# Patient Record
Sex: Male | Born: 1940
Health system: Southern US, Community
[De-identification: ages and names within clinical notes are randomized; demographics above are authoritative.]

## PROBLEM LIST (undated history)

## (undated) DIAGNOSIS — G4730 Sleep apnea, unspecified: Secondary | ICD-10-CM

## (undated) DIAGNOSIS — F952 Tourette's disorder: Secondary | ICD-10-CM

## (undated) DIAGNOSIS — I10 Essential (primary) hypertension: Secondary | ICD-10-CM

## (undated) DIAGNOSIS — Z87442 Personal history of urinary calculi: Secondary | ICD-10-CM

## (undated) DIAGNOSIS — E119 Type 2 diabetes mellitus without complications: Secondary | ICD-10-CM

## (undated) DIAGNOSIS — G709 Myoneural disorder, unspecified: Secondary | ICD-10-CM

## (undated) DIAGNOSIS — G4733 Obstructive sleep apnea (adult) (pediatric): Secondary | ICD-10-CM

## (undated) DIAGNOSIS — R7303 Prediabetes: Secondary | ICD-10-CM

## (undated) DIAGNOSIS — I2510 Atherosclerotic heart disease of native coronary artery without angina pectoris: Secondary | ICD-10-CM

## (undated) DIAGNOSIS — E7800 Pure hypercholesterolemia, unspecified: Secondary | ICD-10-CM

## (undated) HISTORY — DX: Atherosclerotic heart disease of native coronary artery without angina pectoris: I25.10

## (undated) HISTORY — DX: Type 2 diabetes mellitus without complications: E11.9

## (undated) HISTORY — DX: Tourette's disorder: F95.2

## (undated) HISTORY — DX: Pure hypercholesterolemia, unspecified: E78.00

## (undated) HISTORY — DX: Sleep apnea, unspecified: G47.30

## (undated) HISTORY — DX: Obstructive sleep apnea (adult) (pediatric): G47.33

---

## 1981-10-29 HISTORY — PX: BACK SURGERY: SHX140

## 1995-10-30 DIAGNOSIS — I209 Angina pectoris, unspecified: Secondary | ICD-10-CM

## 1995-10-30 HISTORY — PX: CARDIAC CATHETERIZATION: SHX172

## 1995-10-30 HISTORY — DX: Angina pectoris, unspecified: I20.9

## 1996-01-16 HISTORY — PX: OTHER SURGICAL HISTORY: SHX169

## 2002-05-15 ENCOUNTER — Ambulatory Visit (HOSPITAL_COMMUNITY): Admission: RE | Admit: 2002-05-15 | Discharge: 2002-05-15 | Payer: Self-pay | Admitting: Cardiology

## 2002-05-15 ENCOUNTER — Encounter: Payer: Self-pay | Admitting: Cardiology

## 2003-09-13 ENCOUNTER — Ambulatory Visit: Admission: RE | Admit: 2003-09-13 | Discharge: 2003-09-13 | Payer: Self-pay | Admitting: Pulmonary Disease

## 2005-01-29 ENCOUNTER — Ambulatory Visit: Payer: Self-pay | Admitting: *Deleted

## 2006-04-29 ENCOUNTER — Ambulatory Visit: Payer: Self-pay | Admitting: *Deleted

## 2007-05-26 ENCOUNTER — Ambulatory Visit (HOSPITAL_COMMUNITY): Admission: RE | Admit: 2007-05-26 | Discharge: 2007-05-26 | Payer: Self-pay | Admitting: Urology

## 2011-03-16 NOTE — Procedures (Signed)
[facility]  Patient:    [name], [name] Visit Number: [id number] MRN: [id number]          Service Type: OUT Location: RAD Attending Physician:  Mirian Mo Dictated by:   Joellyn Rued, P.A.-C. Proc. Date: 05/15/02 Admit Date:  05/15/2002 Discharge Date: 05/15/2002                                Stress Test  DATE OF BIRTH:  [birth year]  SUMMARY OF HISTORY:  [name] is a 70 year old white male who returned to our office on May 04, 2002 after being absent for two and a half years.  He is not having any active cardiac issues; however, Dr. Daleen Squibb felt that it was well overdue for a stress Cardiolite to evaluate his history of coronary artery disease with angioplasty to the RCA in August 1996 and stent to the RCA in 1997, hyperlipidemia, remote tobacco use, and obesity.  Baseline EKG showed normal sinus rhythm, blood pressure 114/70, resting heart rate 72.  Utilizing a Bruce protocol, [name] ambulated for a total of nine minutes and 18 seconds achieving 10.1 METS.  Maximum blood pressure was 200/98.  Maximum heart rate was 155.  This was 99% of predicted maximum range. Toward the end of the test he complained of fatigue, felt that he was out of shape, and some shortness of breath.  EKG did not show any acute changes.  Dr. Daleen Squibb to review.  Final results and images pending Dr. Anola Gurney review.Dictated by: Joellyn Rued, P.A.-C. Attending Physician:  Mirian Mo DD:  05/15/02 TD:  05/20/02 Job: [id number]

## 2011-12-12 DIAGNOSIS — H25019 Cortical age-related cataract, unspecified eye: Secondary | ICD-10-CM | POA: Diagnosis not present

## 2012-07-25 DIAGNOSIS — Z23 Encounter for immunization: Secondary | ICD-10-CM | POA: Diagnosis not present

## 2012-09-03 DIAGNOSIS — I10 Essential (primary) hypertension: Secondary | ICD-10-CM | POA: Diagnosis not present

## 2012-09-03 DIAGNOSIS — E119 Type 2 diabetes mellitus without complications: Secondary | ICD-10-CM | POA: Diagnosis not present

## 2012-09-03 DIAGNOSIS — E785 Hyperlipidemia, unspecified: Secondary | ICD-10-CM | POA: Diagnosis not present

## 2012-09-03 DIAGNOSIS — E782 Mixed hyperlipidemia: Secondary | ICD-10-CM | POA: Diagnosis not present

## 2012-09-04 DIAGNOSIS — E119 Type 2 diabetes mellitus without complications: Secondary | ICD-10-CM | POA: Diagnosis not present

## 2012-09-04 DIAGNOSIS — R109 Unspecified abdominal pain: Secondary | ICD-10-CM | POA: Diagnosis not present

## 2012-09-04 DIAGNOSIS — I10 Essential (primary) hypertension: Secondary | ICD-10-CM | POA: Diagnosis not present

## 2012-11-29 HISTORY — PX: CATARACT EXTRACTION: SUR2

## 2012-12-12 DIAGNOSIS — H52209 Unspecified astigmatism, unspecified eye: Secondary | ICD-10-CM | POA: Diagnosis not present

## 2012-12-12 DIAGNOSIS — H2510 Age-related nuclear cataract, unspecified eye: Secondary | ICD-10-CM | POA: Diagnosis not present

## 2012-12-12 DIAGNOSIS — E119 Type 2 diabetes mellitus without complications: Secondary | ICD-10-CM | POA: Diagnosis not present

## 2012-12-12 DIAGNOSIS — H35379 Puckering of macula, unspecified eye: Secondary | ICD-10-CM | POA: Diagnosis not present

## 2013-01-06 DIAGNOSIS — H25019 Cortical age-related cataract, unspecified eye: Secondary | ICD-10-CM | POA: Diagnosis not present

## 2013-01-06 DIAGNOSIS — H2510 Age-related nuclear cataract, unspecified eye: Secondary | ICD-10-CM | POA: Diagnosis not present

## 2013-01-06 DIAGNOSIS — H2589 Other age-related cataract: Secondary | ICD-10-CM | POA: Diagnosis not present

## 2013-04-01 ENCOUNTER — Encounter: Payer: Self-pay | Admitting: Neurology

## 2013-04-01 ENCOUNTER — Ambulatory Visit (INDEPENDENT_AMBULATORY_CARE_PROVIDER_SITE_OTHER): Payer: Medicare Other | Admitting: Neurology

## 2013-04-01 VITALS — BP 133/70 | HR 70 | Temp 97.4°F | Ht 68.0 in | Wt 252.0 lb

## 2013-04-01 DIAGNOSIS — M2669 Other specified disorders of temporomandibular joint: Secondary | ICD-10-CM | POA: Diagnosis not present

## 2013-04-01 DIAGNOSIS — G4733 Obstructive sleep apnea (adult) (pediatric): Secondary | ICD-10-CM | POA: Diagnosis not present

## 2013-04-01 DIAGNOSIS — W19XXXS Unspecified fall, sequela: Secondary | ICD-10-CM

## 2013-04-01 DIAGNOSIS — Y92009 Unspecified place in unspecified non-institutional (private) residence as the place of occurrence of the external cause: Secondary | ICD-10-CM

## 2013-04-01 DIAGNOSIS — F952 Tourette's disorder: Secondary | ICD-10-CM | POA: Diagnosis not present

## 2013-04-01 HISTORY — DX: Tourette's disorder: F95.2

## 2013-04-01 MED ORDER — GUANFACINE HCL 1 MG PO TABS: ORAL_TABLET | ORAL | Status: DC

## 2013-04-01 NOTE — Progress Notes (Signed)
Subjective:    Patient ID: [name] is a 72 y.o. male.  HPI  Huston Foley, MD, PhD [facility] [address], [address] [address] [place], [place] [address]  Dear Dr. Margo Aye,  I saw your patient, [name], upon your kind request in my neurologic clinic today for initial consultation of his history of Tourette's syndrome. The patient is accompanied by his wife today. As you know, [name] is a very pleasant 72 year old right-handed gentleman with an underlying medical history of coronary artery disease, status post angioplasty in 1996 and stent in 1997, hyperlipidemia, obesity, who started having having phonic tic at age 79. He saw Dr. Lyman Bishop at [facility] for CO2 treatments. He had ongoing motor and phonic tics and also had a period of coprolalia over the years and was diagnosed with Tourette's syndrome at age 34 by Dr. Kathrynn Ducking, Neurologist at [facility]. Prior to that they treated him with Malady de tics. His youngest son has tourettes per patient. The patient currently has jaw clenching and tooth grinding tics, neck jerking tics, and grunting and coughing tics.  He had different motor tics and phonic tics in the past. He was treated in the past with Haldol, but he was not on any other meds for this. He has had some balance problems, and fell recently, with no injury to head or otherwise, no LOC. He is overweight and snores heavily. He was diagnosed with severe OSA in the past, some 10-15 years ago, but has not been treated for it.   His Past Medical History Is Significant For: Past Medical History  Diagnosis Date  . Tourette syndrome   . Diabetes   . Tourette's syndrome 04/01/2013    His Past Surgical History Is Significant For: Past Surgical History  Procedure Laterality Date  . Cataract extraction  11/2012  . Heart stent  01/16/1996    His Family History Is Significant For: No family history on file.  His Social History Is Significant For: History   Social  History  . Marital Status: Married    Spouse Name: N/A    Number of Children: N/A  . Years of Education: N/A   Social History Main Topics  . Smoking status: Former Games developer  . Smokeless tobacco: None  . Alcohol Use: No  . Drug Use: No  . Sexually Active: None   Other Topics Concern  . None   Social History Narrative  . None    His Allergies Are:  No Known Allergies:   His Current Medications Are:  Outpatient Encounter Prescriptions as of 04/01/2013  Medication Sig Dispense Refill  . aspirin (ECOTRIN LOW STRENGTH) 81 MG EC tablet Take 81 mg by mouth daily. Swallow whole.      Marland Kitchen KOMBIGLYZE XR 02-999 MG TB24       . metoprolol succinate (TOPROL-XL) 25 MG 24 hr tablet       . simvastatin (ZOCOR) 40 MG tablet        No facility-administered encounter medications on file as of 04/01/2013.   Review of Systems  Respiratory:       Snoring  Genitourinary:       Impotence  Hematological: Bruises/bleeds easily.    Objective:  Neurologic Exam  Physical Exam Physical Examination:   Filed Vitals:   04/01/13 0834  BP: 133/70  Pulse: 70  Temp: 97.4 F (36.3 C)    General Examination: The patient is a very pleasant 72 y.o. male in no acute distress. He appears well-developed  and well-nourished, obese and well groomed.   HEENT: He has intermittent jaw clenching tics and neck flexion tics and grunting and yelling tics and coughing and sniffing tics. He has rare facial grimacing tics and eye rolling tics. Normocephalic, atraumatic, pupils are equal, round and reactive to light and accommodation. Funduscopic exam is normal with sharp disc margins noted. Extraocular tracking is good without limitation to gaze excursion or nystagmus noted. Normal smooth pursuit is noted. Hearing is grossly intact. Tympanic membranes are clear bilaterally. Face is symmetric with normal facial animation and normal facial sensation. Speech is clear with no dysarthria noted. There is no hypophonia. There is no  lip, neck/head, jaw or voice tremor. Neck is supple with full range of passive and active motion. There are no carotid bruits on auscultation. Oropharynx exam reveals: mild mouth dryness, adequate dental hygiene and severe airway crowding, due to narrow airway, large uvula, thick soft palate. Mallampati is class III. Tongue protrudes centrally and palate elevates symmetrically. Neck size is 20 inches.   Chest: Clear to auscultation without wheezing, rhonchi or crackles noted.  Heart: S1+S2+0, regular and normal without murmurs, rubs or gallops noted.   Abdomen: Soft, non-tender and non-distended with normal bowel sounds appreciated on auscultation.  Extremities: There is 1+ pitting edema in the distal lower extremities bilaterally. Pedal pulses are intact.  Skin: Warm and dry without trophic changes noted. There are no varicose veins, but has spider veins.  Musculoskeletal: exam reveals no obvious joint deformities, tenderness or joint swelling or erythema.   Neurologically:  Mental status: The patient is awake, alert and oriented in all 4 spheres. His memory, attention, language and knowledge are appropriate. There is no aphasia, agnosia, apraxia or anomia. Speech is clear with normal prosody and enunciation with intermittent grunting, sniffing, yelling out, throat clearing, coughing tics. Thought process is linear. Mood is congruent and affect is normal.  Cranial nerves are as described above under HEENT exam. In addition, shoulder shrug is normal with equal shoulder height noted. Motor exam: Normal bulk, strength and tone is noted. There is no drift, tremor or rebound. Romberg is negative. Reflexes are 2+ throughout. Toes are downgoing bilaterally. Fine motor skills are intact with normal finger taps, normal hand movements, normal rapid alternating patting, normal foot taps and normal foot agility.  Cerebellar testing shows no dysmetria or intention tremor on finger to nose testing. Heel to shin  is unremarkable bilaterally. There is no truncal or gait ataxia.  Sensory exam is intact to light touch, pinprick, vibration, temperature sense and proprioception in the upper and lower extremities.  Gait, station and balance are unremarkable. No veering to one side is noted. No leaning to one side is noted. Posture is age-appropriate and stance is narrow based. No problems turning are noted. He turns en bloc.               Assessment and Plan:   Assessment and Plan:  In summary, [name] is a very pleasant 72 y.o.-year old male with a history of Tourette's syndrome, diagnosed many years ago. His physical exam is c/w phonic and motor tics and overwise non-focal with the exception of obesity and a tight airway with a prior Dx of OSA.  I had a long chat with the patient and wife about my findings and the diagnosis of TS, as well as OSA, the prognosis and treatment options. We talked about medical treatments and non-pharmacological approaches. We talked about maintaining a healthy lifestyle in general. I encouraged  the patient to eat healthy, exercise daily and keep well hydrated, to keep a scheduled bedtime and wake time routine, to not skip any meals and eat healthy snacks in between meals and to have protein with every meal. We talked about wt loss.   As far as further diagnostic testing is concerned, I suggested the following today: no test needed today other than sleep study. I talked to him about the cardiovascular implications of untreated OSA. He would be willing to get retested and treated with CPAP if the need arises. As far as his Tourette's syndrome symptomatic treatment he understands that not every medication is successful and sometimes we have to try and switch to different medications. To that end I would like to try him on Tenex starting at half a milligram at night and then building up to 1 mg at night thereafter. He is advised to monitor his blood pressure. He is advised to look out  for symptoms of lightheadedness and bradycardia. Alternatively I may try him on Orap down the Road. He understands that neuroleptic medications can cause other complications in the future with ongoing use. I would like see him back in 3 months from now, sooner if the need arises and encouraged them to call with any interim questions, concerns or problems. I will also initiate treatment for sleep apnea if the sleep is positive for OSA which I believe he has a fairly high pretest probability for OSA.  Thank you very much for allowing me to participate in the care of this nice patient. If I can be of any further assistance to you please do not hesitate to call me at [phone].  Sincerely,   Huston Foley, MD, PhD talked to him about the cardiovascular implications of obstructive sleep apnea

## 2013-04-01 NOTE — Patient Instructions (Addendum)
I think overall you are doing fairly well but I do want to suggest a few things today:  Remember to drink plenty of fluid, eat healthy meals and do not skip any meals. Try to eat protein with a every meal and eat a healthy snack such as fruit or nuts in between meals. Try to keep a regular sleep-wake schedule and try to exercise daily, particularly in the form of walking, 20-30 minutes a day, if you can.   Measure your blood pressure at home every other day for the first 2 weeks.   As far as your medications are concerned, I would like to suggest a trial of Tenex: Take 1/2 pill each night for 2 weeks, then 1 pill each bedtime thereafter.    As far as diagnostic testing: No test today.  I would like to see you back in 3 months, sooner if we need to. Please call us with any interim questions, concerns, problems, updates or refill requests.  Brett Canales is my clinical assistant and will answer any of your questions and relay your messages to me and also relay most of my messages to you.  Our phone number is [phone]. We also have an after hours call service for urgent matters and there is a physician on-call for urgent questions. For any emergencies you know to call 911 or go to the nearest emergency room.

## 2013-04-23 ENCOUNTER — Ambulatory Visit (INDEPENDENT_AMBULATORY_CARE_PROVIDER_SITE_OTHER): Payer: Medicare Other | Admitting: Neurology

## 2013-04-23 VITALS — BP 115/68 | HR 58 | Ht 68.0 in | Wt 250.0 lb

## 2013-04-23 DIAGNOSIS — G479 Sleep disorder, unspecified: Secondary | ICD-10-CM | POA: Diagnosis not present

## 2013-04-23 DIAGNOSIS — G4731 Primary central sleep apnea: Secondary | ICD-10-CM

## 2013-04-23 DIAGNOSIS — G4733 Obstructive sleep apnea (adult) (pediatric): Secondary | ICD-10-CM | POA: Diagnosis not present

## 2013-05-08 ENCOUNTER — Telehealth: Payer: Self-pay | Admitting: Neurology

## 2013-05-08 DIAGNOSIS — G4731 Primary central sleep apnea: Secondary | ICD-10-CM

## 2013-05-08 DIAGNOSIS — G4733 Obstructive sleep apnea (adult) (pediatric): Secondary | ICD-10-CM

## 2013-05-08 NOTE — Telephone Encounter (Signed)
Please call and notify patient that the recent sleep study confirmed the diagnosis of OSA. He did well with biPAP during the study with significant improvement of the respiratory events. Therefore, I would like start the patient on biPAP at home. I placed the order in the chart.   Arrange for PAP set up at home through a DME company of patient's choice and fax/route report to PCP and referring MD (if other than PCP).   The patient will also need a follow up appointment with me in 6-8 weeks post set up that has to be scheduled; help the patient schedule this (in a follow-up slot).   Please re-enforce the importance of compliance with treatment and the need for [place] to monitor compliance data.   Once you have spoken to the patient and scheduled the return appointment, you may close this encounter, thanks,   Huston Foley, MD, PhD [facility]

## 2013-05-10 NOTE — Telephone Encounter (Signed)
Called pt on cell, he asked me to call him back in 15 minutes at his home number, I called this number and he wasn't home yet.  Will try again later on this week.

## 2013-05-14 NOTE — Telephone Encounter (Signed)
Called patient to discuss sleep study results.  Discussed findings, recommendations and follow up care.  Patient understood well and all questions were answered.    Orders will be sent to [facility] 05/14/2013.  Results will be faxed to Dr. Dwana Melena.  Results will be mailed to pt, he already has a follow up scheduled in September with Dr. Frances Furbish.

## 2013-05-20 ENCOUNTER — Encounter: Payer: Self-pay | Admitting: *Deleted

## 2013-06-25 ENCOUNTER — Ambulatory Visit: Payer: Medicare Other | Admitting: Neurology

## 2013-07-02 ENCOUNTER — Ambulatory Visit: Payer: Medicare Other | Admitting: Neurology

## 2013-08-03 ENCOUNTER — Ambulatory Visit: Payer: Medicare Other | Admitting: Neurology

## 2013-08-05 ENCOUNTER — Ambulatory Visit (INDEPENDENT_AMBULATORY_CARE_PROVIDER_SITE_OTHER): Payer: Medicare Other | Admitting: Neurology

## 2013-08-05 ENCOUNTER — Encounter: Payer: Self-pay | Admitting: Neurology

## 2013-08-05 VITALS — BP 110/73 | HR 65 | Temp 97.0°F | Ht 68.0 in | Wt 252.0 lb

## 2013-08-05 DIAGNOSIS — G4731 Primary central sleep apnea: Secondary | ICD-10-CM

## 2013-08-05 DIAGNOSIS — F952 Tourette's disorder: Secondary | ICD-10-CM | POA: Diagnosis not present

## 2013-08-05 DIAGNOSIS — G4730 Sleep apnea, unspecified: Secondary | ICD-10-CM

## 2013-08-05 DIAGNOSIS — G4733 Obstructive sleep apnea (adult) (pediatric): Secondary | ICD-10-CM | POA: Insufficient documentation

## 2013-08-05 HISTORY — DX: Obstructive sleep apnea (adult) (pediatric): G47.33

## 2013-08-05 MED ORDER — GUANFACINE HCL 1 MG PO TABS: ORAL_TABLET | ORAL | Status: DC

## 2013-08-05 NOTE — Progress Notes (Signed)
Subjective:    Patient ID: [name] is a 72 y.o. male.  HPI  Interim history:   [name] is a very pleasant 72 year old right-handed gentleman with an underlying medical history of coronary artery disease, status post angioplasty in 1996 and stent in 1997, hyperlipidemia, obesity, and Tourette's syndrome, who presents for followup consultation after his split-night sleep study. I first met him on 04/01/2013 for his Tourette's syndrome, at which time he also reported a prior diagnosis of severe obstructive sleep apnea for which he had never been treated. I suggested a trial of Tenex for his Tourette's syndrome. I asked him to come back for sleep study and he had a split-night sleep study on 04/23/2013 which I reviewed with him in detail. He also received a copy of the report in the mail previously and was contacted via phone about the test results. His baseline sleep efficiency was reduced at 62.2% with a latency to sleep of 4 minutes and wake after sleep onset of 17 minutes with severe sleep fragmentation noted. He had a increased percentage of stage I sleep, I reduced percentage of stage II sleep, absence of slow-wave sleep and absence of REM sleep prior to CPAP. His baseline oxygen saturation was 91%, his nadir was 73%. He was noted to have mild to moderate snoring. His AHI was 64 per hour. He was then titrated on CPAP with pressures of 5-12 cm. He had ongoing central apneas and was therefore switched to BiPAP and titrated from 14/10 cm to 15/11 cm. His AHI was reduced to 7.5 events at that pressure. Based on the test results I prescribed BiPAP for the patient at a pressure of 15/11 cm with a large size nasal pillows mask. I also reviewed compliance data from 06/15/2013 through 08/02/2013 which is a total of 49 days during which time he used it 35 days which is 71%. Percent use stays greater than 4 hours was only 23 days which is 47%. Average usage for all days was 3 hours and 10 minutes an  average usage for days use was 4 hours and 26 minutes. His residual AHI was 3.5 per hour, indicating an appropriate BiPAP pressure of 15/11 with "easy-breathe" on.  He reports not taking his machine when [place] the [place]. He feels better with regards to his sleep. He has no major difficulty tolerating BiPAP at this pressure. He uses a nasal pillows mask. He has been going to bed late, around 2 AM by habit. He estimates, that he uses it about 4 hours, as he does not sleep much more than that. He is not as sleepy as before.   He has had improvement in his tics, which have primarily been jaw clenching and phonic tics with Tenex, which is currently 1 mg qHS. He has no major side effects.  He started having having phonic tic at age 86. He saw Dr. Lyman Bishop at [facility] for CO2 treatments. He had ongoing motor and phonic tics and also had a period of coprolalia over the years and was diagnosed with Tourette's syndrome at age 36 by Dr. Kathrynn Ducking, Neurologist at [facility]. 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 His youngest son has tourettes per patient. The patient currently has jaw clenching and tooth grinding tics, neck jerking tics, and grunting and coughing tics.  He had different motor tics and phonic tics in the past. He was treated in the past with Haldol, but he was not on any other meds for this.  He has had some balance problems, and fell recently, with no injury to head or otherwise, no LOC.    His Past Medical History Is Significant For: Past Medical History  Diagnosis Date  . Tourette syndrome   . Diabetes   . Tourette's syndrome 04/01/2013  . OSA (obstructive sleep apnea) 08/05/2013    His Past Surgical History Is Significant For: Past Surgical History  Procedure Laterality Date  . Cataract extraction  11/2012  . Heart stent  01/16/1996    His Family History Is Significant For: History reviewed. No pertinent family history.  His Social History Is Significant For: History   Social  History  . Marital Status: Married    Spouse Name: N/A    Number of Children: N/A  . Years of Education: N/A   Social History Main Topics  . Smoking status: Former Games developer  . Smokeless tobacco: None  . Alcohol Use: No  . Drug Use: No  . Sexual Activity: None   Other Topics Concern  . None   Social History Narrative  . None    His Allergies Are:  No Known Allergies:   His Current Medications Are:  Outpatient Encounter Prescriptions as of 08/05/2013  Medication Sig Dispense Refill  . aspirin (ECOTRIN LOW STRENGTH) 81 MG EC tablet Take 81 mg by mouth daily. Swallow whole.      Marland Kitchen guanFACINE (TENEX) 1 MG tablet Take 1/2 pill each night for 2 weeks, then 1 pill each bedtime thereafter.  30 tablet  5  . KOMBIGLYZE XR 02-999 MG TB24       . metoprolol succinate (TOPROL-XL) 25 MG 24 hr tablet       . simvastatin (ZOCOR) 40 MG tablet        No facility-administered encounter medications on file as of 08/05/2013.  : Review of Systems  Constitutional: Positive for fatigue.  Respiratory:       Snoring  Musculoskeletal:       Cramps  Hematological: Bruises/bleeds easily.  Psychiatric/Behavioral: Positive for sleep disturbance. The patient is nervous/anxious.        Slepiness    Objective:  Neurologic Exam  Physical Exam Physical Examination:   Filed Vitals:   08/05/13 0927  BP: 110/73  Pulse: 65  Temp: 97 F (36.1 C)     General Examination: The patient is a very pleasant 72 y.o. male in no acute distress. He appears well-developed and well-nourished, obese and well groomed.   HEENT: He has intermittent jaw clenching tics and less neck flexion tics and grunting and less yelling tics and no coughing or sniffing tics today. He has rare facial grimacing tics and eye rolling tics. Normocephalic, atraumatic, pupils are equal, round and reactive to light and accommodation. Extraocular tracking is good without limitation to gaze excursion or nystagmus noted. Normal smooth  pursuit is noted. Hearing is grossly intact. Face is symmetric with normal facial animation and normal facial sensation. Speech is clear with no dysarthria noted. There is no hypophonia. There is no lip, neck/head, jaw or voice tremor. Neck is supple with full range of passive and active motion. There are no carotid bruits on auscultation. Oropharynx exam reveals: mild mouth dryness, adequate dental hygiene and severe airway crowding, due to narrow airway, large uvula, thick soft palate. Mallampati is class III. Tongue protrudes centrally and palate elevates symmetrically.   Chest: Clear to auscultation without wheezing, rhonchi or crackles noted.  Heart: S1+S2+0, regular and normal without murmurs, rubs or gallops noted.   Abdomen:  Soft, non-tender and non-distended with normal bowel sounds appreciated on auscultation.  Extremities: There is 1+ pitting edema in the distal lower extremities bilaterally. Pedal pulses are intact.  Skin: Warm and dry without trophic changes noted. There are no varicose veins, but has spider veins.  Musculoskeletal: exam reveals no obvious joint deformities, tenderness or joint swelling or erythema.   Neurologically:  Mental status: The patient is awake, alert and oriented in all 4 spheres. His memory, attention, language and knowledge are appropriate. There is no aphasia, agnosia, apraxia or anomia. Speech is clear with normal prosody and enunciation with intermittent grunting, sniffing, yelling out, throat clearing, coughing tics. Thought process is linear. Mood is congruent and affect is normal.  Cranial nerves are as described above under HEENT exam. In addition, shoulder shrug is normal with equal shoulder height noted. Motor exam: Normal bulk, strength and tone is noted. Tics are described above. There is no drift, tremor or rebound. Romberg is negative. Reflexes are 2+ throughout. Toes are downgoing bilaterally. Fine motor skills are intact with normal finger  taps, normal hand movements, normal rapid alternating patting, normal foot taps and normal foot agility.  Cerebellar testing shows no dysmetria or intention tremor on finger to nose testing. Heel to shin is unremarkable bilaterally. There is no truncal or gait ataxia.  Sensory exam is intact to light touch.  Gait, station and balance are unremarkable. No veering to one side is noted. No leaning to one side is noted. Posture is age-appropriate and stance is narrow based. No problems turning are noted. He turns en bloc.               Assessment and Plan:   In summary, DESTON BILYEU is a very pleasant 72 y.o.-year old male with an underlying history of diabetes and obesity who presents for followup consultation of his Tourette's syndrome as well as severe OSA with a central component. His physical exam is c/w phonic and motor tics and overwise non-focal with the exception of obesity and a tight airway. He is on BiPAP treatment for his complex sleep apnea at a pressure of 15/11 cwp. He indicates improvement in his daytime somnolence and sleep consolidation, or however his compliance has been mediocre and I told him about this. I not only explained the sleep test results and the importance of being treated for reduction of cardiovascular risks but also stressed the importance of being regular with treatment and take the machine with him when he goes on any trips and also when he naps. He states that he usually wakes up around 5 and starts reading the paper sitting in the recliner but often dozes off while he waits for his wife to wake up around 8:30 or 9. He may be going without sleep apnea treatment for a few hours in those early morning hours when he sits in a recliner and dozes off. He understands that any time he sleeps he needs to be treated for his sleep apnea. He has had improvement in his takes with Tenex. He has no major side effects. I be treated the most common side effects to be expected being  somnolence, mouth dryness and constipation. I would like for him to increase this gradually to 2 mg each night by taking 1-1/2 pills each night for 2 weeks and then 2 pills each night thereafter. He was in agreement. We again talked about maintaining a healthy lifestyle. I also talked to her about sleep hygiene and encouraged him to have  a better sleep and wake schedule. I encouraged the patient to eat healthy, exercise daily and keep well hydrated, to keep a scheduled bedtime and wake time routine, to not skip any meals and eat healthy snacks in between meals and to have protein with every meal. I stressed the importance of regular exercise.  We talked about wt loss.   Alternatively treatments for his takes may include Orap down the Road. He understands that neuroleptic medications can cause other complications in the future with ongoing use and can cause complications in the elderly. Therefore, for now we will increase Tenex. I would like see him back in 6 months from now, sooner if the need arises and encouraged them to call with any interim questions, concerns or problems.

## 2013-08-05 NOTE — Patient Instructions (Addendum)
Please continue using your BiPAP regularly. While your insurance requires that you use PAP at least 4 hours each night on 70% of the nights, I recommend, that you not skip any nights and use it throughout the night if you can. Getting used to PAP does take time and patience and discipline. Untreated obstructive sleep apnea when it is moderate to severe can have an adverse impact on cardiovascular health and raise her risk for heart disease, arrhythmias, hypertension, congestive heart failure, stroke and diabetes. Untreated obstructive sleep apnea causes sleep disruption, nonrestorative sleep, and sleep deprivation. This can have an impact on your day to day functioning and cause daytime sleepiness and impairment of cognitive function, memory loss, mood disturbance, and problems focussing. Using PAP regularly can improve these symptoms.  Please remember to try to maintain good sleep hygiene, which means: Keep a regular sleep and wake schedule, try not to exercise or have a meal within 2 hours of your bedtime, try to keep your bedroom conducive for sleep, that is, cool and dark, without light distractors such as an illuminated alarm clock, and refrain from watching TV right before sleep or in the middle of the night and do not keep the TV or radio on during the night. Also, try not to use or play on electronic devices at bedtime, such as your cell phone, tablet PC or laptop. If you like to read at bedtime on an electronic device, try to dim the background light as much as possible.  We are gradually increasing your Tenex 1 mg: 1 1/2 pills each night for 2 weeks, then 2 pills each night thereafter. Side effects include sedation, mouth dryness, constipation.

## 2013-08-06 ENCOUNTER — Encounter: Payer: Self-pay | Admitting: Neurology

## 2013-09-03 DIAGNOSIS — H31009 Unspecified chorioretinal scars, unspecified eye: Secondary | ICD-10-CM | POA: Diagnosis not present

## 2013-09-03 DIAGNOSIS — Z961 Presence of intraocular lens: Secondary | ICD-10-CM | POA: Diagnosis not present

## 2013-09-03 DIAGNOSIS — E119 Type 2 diabetes mellitus without complications: Secondary | ICD-10-CM | POA: Diagnosis not present

## 2013-10-13 DIAGNOSIS — M546 Pain in thoracic spine: Secondary | ICD-10-CM | POA: Diagnosis not present

## 2013-10-13 DIAGNOSIS — M542 Cervicalgia: Secondary | ICD-10-CM | POA: Diagnosis not present

## 2013-10-13 DIAGNOSIS — M999 Biomechanical lesion, unspecified: Secondary | ICD-10-CM | POA: Diagnosis not present

## 2013-10-13 DIAGNOSIS — M9981 Other biomechanical lesions of cervical region: Secondary | ICD-10-CM | POA: Diagnosis not present

## 2013-10-16 DIAGNOSIS — M999 Biomechanical lesion, unspecified: Secondary | ICD-10-CM | POA: Diagnosis not present

## 2013-10-16 DIAGNOSIS — M9981 Other biomechanical lesions of cervical region: Secondary | ICD-10-CM | POA: Diagnosis not present

## 2013-10-16 DIAGNOSIS — M542 Cervicalgia: Secondary | ICD-10-CM | POA: Diagnosis not present

## 2013-10-16 DIAGNOSIS — M546 Pain in thoracic spine: Secondary | ICD-10-CM | POA: Diagnosis not present

## 2013-10-20 DIAGNOSIS — M9981 Other biomechanical lesions of cervical region: Secondary | ICD-10-CM | POA: Diagnosis not present

## 2013-10-20 DIAGNOSIS — M999 Biomechanical lesion, unspecified: Secondary | ICD-10-CM | POA: Diagnosis not present

## 2013-10-20 DIAGNOSIS — M546 Pain in thoracic spine: Secondary | ICD-10-CM | POA: Diagnosis not present

## 2013-10-20 DIAGNOSIS — M542 Cervicalgia: Secondary | ICD-10-CM | POA: Diagnosis not present

## 2013-10-27 DIAGNOSIS — M546 Pain in thoracic spine: Secondary | ICD-10-CM | POA: Diagnosis not present

## 2013-10-27 DIAGNOSIS — M999 Biomechanical lesion, unspecified: Secondary | ICD-10-CM | POA: Diagnosis not present

## 2013-10-27 DIAGNOSIS — M542 Cervicalgia: Secondary | ICD-10-CM | POA: Diagnosis not present

## 2013-10-27 DIAGNOSIS — M9981 Other biomechanical lesions of cervical region: Secondary | ICD-10-CM | POA: Diagnosis not present

## 2013-12-04 DIAGNOSIS — Z23 Encounter for immunization: Secondary | ICD-10-CM | POA: Diagnosis not present

## 2013-12-21 ENCOUNTER — Encounter: Payer: Self-pay | Admitting: Neurology

## 2013-12-25 NOTE — Progress Notes (Signed)
Quick Note:  I reviewed the patient's BiPAP compliance data from 11/17/2013 to 12/16/2013, which is a total of 30 days, during which time the patient used BiPAP every day. The average usage for all days was 5 hours and 29 minutes. The percent used days greater than 4 hours was 77 %, indicating good compliance. The residual AHI was 3.1 per hour, indicating an adequate treatment pressure of 15/11 cm. I will review this data with the patient at the next office visit, which is currently scheduled for 02/03/2014 at 11:30 AM, provide feedback and additional troubleshooting if need be.  Star Age, MD, PhD [facility] ([facility]   ______

## 2013-12-30 ENCOUNTER — Encounter: Payer: Self-pay | Admitting: Neurology

## 2014-01-15 ENCOUNTER — Encounter: Payer: Self-pay | Admitting: Neurology

## 2014-01-27 ENCOUNTER — Encounter: Payer: Self-pay | Admitting: Neurology

## 2014-01-27 ENCOUNTER — Ambulatory Visit (INDEPENDENT_AMBULATORY_CARE_PROVIDER_SITE_OTHER): Payer: Medicare Other | Admitting: Neurology

## 2014-01-27 ENCOUNTER — Encounter (INDEPENDENT_AMBULATORY_CARE_PROVIDER_SITE_OTHER): Payer: Self-pay

## 2014-01-27 VITALS — BP 116/70 | HR 72 | Temp 99.5°F | Ht 68.0 in | Wt 245.0 lb

## 2014-01-27 DIAGNOSIS — G4731 Primary central sleep apnea: Secondary | ICD-10-CM

## 2014-01-27 DIAGNOSIS — G4730 Sleep apnea, unspecified: Secondary | ICD-10-CM | POA: Diagnosis not present

## 2014-01-27 DIAGNOSIS — F952 Tourette's disorder: Secondary | ICD-10-CM

## 2014-01-27 DIAGNOSIS — G4733 Obstructive sleep apnea (adult) (pediatric): Secondary | ICD-10-CM

## 2014-01-27 MED ORDER — GUANFACINE HCL 1 MG PO TABS: 2.0000 mg | ORAL_TABLET | Freq: Every day | ORAL | Status: DC

## 2014-01-27 NOTE — Progress Notes (Signed)
Subjective:    Patient ID: [name] is a 73 y.o. male.  HPI    Interim history:   [name] is a very pleasant 73 year old right-handed gentleman with an underlying medical history of coronary artery disease (s/p angioplasty in 1996 and stent in 1997), s/p back surgeries x 2 at [facility], hyperlipidemia, obesity, and Tourette's syndrome, who presents for followup consultation of his TS and his severe OSA with a central component. He is unaccompanied today. I last saw him on 08/05/2013, at which time we talked about his sleep study results and his compliance data. He indicated improvement in his daytime somnolence and sleep consolidation but his compliance was suboptimal at the time. I encouraged him to use BiPAP regularly. He reported improvement of his tics with Tenex. He denied major side effects. I suggested he gradually increase it to 2 mg each night.   I reviewed the patient's BiPAP compliance data from 11/17/2013 to 12/16/2013, which is a total of 30 days, during which time the patient used BiPAP every day. The average usage for all days was 5 hours and 29 minutes. The percent used days greater than 4 hours was 77 %, indicating good compliance. The residual AHI was 3.1 per hour, indicating an adequate treatment pressure of 15/11 cm.   Today, I reviewed his compliance data from 12/28/2013 through 01/26/2014 which is a total of 30 days during which time he was BiPAP every night except for one night. Percent used days greater than 4 hours was 80%, indicating very good compliance, average usage was 5 hours and 7 minutes. His residual AHI is 3.1 which is in the desired range. He is on a pressure of 15/11 cm with spontaneous mode. His leak can be quite high at times.   Today, he reports doing well with his BiPAP and his tics have improved on the increased dose of Tenex. He is trying to stay active. He is worried about his son, who has had worsening of his tics. His nocturia is better and his sleep  hygiene.   I first met him on 04/01/2013 for his Hx of TS, at which time he also reported a prior diagnosis of severe OSA, for which he had never been treated. I suggested a trial of Tenex for his Tourette's syndrome. I asked him to come back for sleep study. He had a split-night sleep study on 04/23/2013: His baseline sleep efficiency was reduced at 62.2% with a latency to sleep of 4 minutes and wake after sleep onset of 17 minutes with severe sleep fragmentation noted. He had a increased percentage of stage I sleep, I reduced percentage of stage II sleep, absence of slow-wave sleep and absence of REM sleep prior to CPAP. His baseline oxygen saturation was 91%, his nadir was 73%. He was noted to have mild to moderate snoring. His AHI was 64 per hour. He was then titrated on CPAP with pressures of 5-12 cm. He had ongoing central apneas and was therefore switched to BiPAP and titrated from 14/10 cm to 15/11 cm. His AHI was reduced to 7.5 events at that pressure. Based on the test results I prescribed BiPAP for the patient at a pressure of 15/11 cm with a large size nasal pillows mask. I reviewed compliance data from 06/15/2013 through 08/02/2013 (49 days), during which time he used it 35 days (71%). Percent used days greater than 4 hours was only 47%. Average usage for all days was 3 hours and 10 minutes an average usage for  days use was 4 hours and 26 minutes. His residual AHI was 3.5 per hour, indicating an appropriate BiPAP pressure of 15/11 with "easy-breathe" on.  He reported not taking his machine [place] the [place]. He felt better with regards to his sleep. He had no major difficulty tolerating BiPAP at this pressure. He had been going to bed late, around 2 AM by habit.  He has had improvement in his tics, which have primarily been jaw clenching and phonic tics with Tenex, which was initially at 1 mg qHS. He had no major side effects.  He started having having phonic tic at age 59. He saw Dr.  Cathie Wallace at [facility] for CO2 treatments. He had ongoing motor and phonic tics and also had a period of coprolalia over the years and was diagnosed with Tourette's syndrome at age 22 by Dr. Bearl Wallace, Neurologist at [facility]. 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 His youngest son has tourettes per patient. The patient currently has jaw clenching and tooth grinding tics, neck jerking tics, and grunting and coughing tics.  He had different motor tics and phonic tics in the past. He was treated in the past with Haldol, but he was not on any other meds for this. He has had some balance problems, and fell recently, with no injury to head or otherwise, no LOC.   His Past Medical History Is Significant For: Past Medical History  Diagnosis Date  . Tourette syndrome   . Diabetes   . Tourette's syndrome 04/01/2013  . OSA (obstructive sleep apnea) 08/05/2013    His Past Surgical History Is Significant For: Past Surgical History  Procedure Laterality Date  . Cataract extraction  11/2012  . Heart stent  01/16/1996    His Family History Is Significant For: History reviewed. No pertinent family history.  His Social History Is Significant For: History   Social History  . Marital Status: Married    Spouse Name: N/A    Number of Children: N/A  . Years of Education: N/A   Social History Main Topics  . Smoking status: Former Research scientist (life sciences)  . Smokeless tobacco: None  . Alcohol Use: No  . Drug Use: No  . Sexual Activity: None   Other Topics Concern  . None   Social History Narrative  . None    His Allergies Are:  No Known Allergies:   His Current Medications Are:  Outpatient Encounter Prescriptions as of 01/27/2014  Medication Sig  . aspirin (ECOTRIN LOW STRENGTH) 81 MG EC tablet Take 81 mg by mouth daily. Swallow whole.  Marland Kitchen guanFACINE (TENEX) 1 MG tablet Take 1 1/2 pills each night for 2 weeks, then 2 pills each bedtime thereafter.  Marland Kitchen KOMBIGLYZE XR 02-999 MG TB24 Take 1 tablet by mouth daily.   .  metoprolol succinate (TOPROL-XL) 25 MG 24 hr tablet Take 25 mg by mouth daily.   . simvastatin (ZOCOR) 40 MG tablet Take 40 mg by mouth daily at 6 PM.   :  Review of Systems:  Out of a complete 14 point review of systems, all are reviewed and negative with the exception of these symptoms as listed below:   Review of Systems  Constitutional: Negative.   HENT: Negative.   Eyes: Negative.   Respiratory: Positive for apnea.   Cardiovascular: Negative.   Gastrointestinal: Negative.   Endocrine: Negative.   Genitourinary: Negative.   Musculoskeletal: Negative.   Skin: Negative.   Allergic/Immunologic: Negative.   Neurological: Negative.  Hematological: Negative.   Psychiatric/Behavioral: Positive for sleep disturbance.    Objective:  Neurologic Exam  Physical Exam Physical Examination:   Filed Vitals:   01/27/14 1136  BP: 116/70  Pulse: 72  Temp: 99.5 F (37.5 C)    General Examination: The patient is a very pleasant 73 y.o. male in no acute distress. He appears well-developed and well-nourished, obese and well groomed.   HEENT: He has intermittent jaw clenching tics and less neck flexion tics and grunting and less yelling tics and no coughing or sniffing tics today. He has rare facial grimacing tics and eye rolling tics. His tics appear better than last time. Normocephalic, atraumatic, pupils are equal, round and reactive to light and accommodation. Extraocular tracking is good without limitation to gaze excursion or nystagmus noted. Normal smooth pursuit is noted. Hearing is grossly intact. Face is symmetric with normal facial animation and normal facial sensation. Speech is clear with no dysarthria noted. There is no hypophonia. There is no lip, neck/head, jaw or voice tremor. Neck is supple with full range of passive and active motion. There are no carotid bruits on auscultation. Oropharynx exam reveals: mild mouth dryness, adequate dental hygiene and severe airway crowding,  due to narrow airway, large uvula, thick soft palate. Mallampati is class III. Tongue protrudes centrally and palate elevates symmetrically.   Chest: Clear to auscultation without wheezing, rhonchi or crackles noted.  Heart: S1+S2+0, regular and normal without murmurs, rubs or gallops noted.   Abdomen: Soft, non-tender and non-distended with normal bowel sounds appreciated on auscultation.  Extremities: There is 1+ pitting edema in the distal lower extremities bilaterally. Pedal pulses are intact.  Skin: Warm and dry without trophic changes noted. There are no varicose veins, but has spider veins.  Musculoskeletal: exam reveals no obvious joint deformities, tenderness or joint swelling or erythema.   Neurologically:  Mental status: The patient is awake, alert and oriented in all 4 spheres. His memory, attention, language and knowledge are appropriate. There is no aphasia, agnosia, apraxia or anomia. Speech is clear with normal prosody and enunciation with intermittent grunting, sniffing, yelling out, throat clearing, coughing tics. Thought process is linear. Mood is congruent and affect is normal.  Cranial nerves are as described above under HEENT exam. In addition, shoulder shrug is normal with equal shoulder height noted. Motor exam: Normal bulk, strength and tone is noted. Tics are described above, no appendicular tics. There is no drift, tremor or rebound. Romberg is negative. Reflexes are 2+ throughout. Toes are downgoing bilaterally. Fine motor skills are intact with normal finger taps, normal hand movements, normal rapid alternating patting, normal foot taps and normal foot agility.  Cerebellar testing shows no dysmetria or intention tremor on finger to nose testing. Heel to shin is unremarkable bilaterally. There is no truncal or gait ataxia.  Sensory exam is intact to light touch, PP and temperature, and vibration.  Gait, station and balance are unremarkable. No veering to one side is  noted. No leaning to one side is noted. Posture is age-appropriate and stance is narrow based. No problems turning are noted. He turns en bloc.               Assessment and Plan:   In summary, URIJAH ARKO is a very pleasant 73 year old male with an underlying history of diabetes and obesity who presents for followup consultation of his Tourette's syndrome as well as severe OSA with a central component. His physical exam is c/w phonic and motor tics,  but stable in that regard and overwise non-focal with the exception of obesity and a tight airway. He is on BiPAP treatment for his complex sleep apnea at a pressure of 15/11 cwp with good results, as far as his daytime somnolence and sleep consolidation. I reviewed his compliance data with him and his wife. His compliance has improved quite a bit and I congratulated him in that regard. He has had improvement in his tics with Tenex which we increased last time to 2 mg each night. He has no major side effects. I encouraged the patient to eat healthy, exercise daily and keep well hydrated, to keep a scheduled bedtime and wake time routine, to not skip any meals and eat healthy snacks in between meals and to have protein with every meal. I stressed the importance of regular exercise.  We talked about wt loss. Alternatively treatments for his tics may include [place]. He understands that neuroleptic medications can cause other complications in the future with ongoing use and can cause complications in the elderly. Therefore, for now we continue with Tenex. I would like see him back in 6 months from now, sooner if the need arises and encouraged them to call with any interim questions, concerns or problems. I provided him with a 90 day prescription for Tenex.

## 2014-01-27 NOTE — Patient Instructions (Addendum)
Please continue using your CPAP regularly. While your insurance requires that you use CPAP at least 4 hours each night on 70% of the nights, I recommend, that you not skip any nights and use it throughout the night if you can. Getting used to CPAP does take time and patience and discipline. Untreated obstructive sleep apnea when it is moderate to severe can have an adverse impact on cardiovascular health and raise her risk for heart disease, arrhythmias, hypertension, congestive heart failure, stroke and diabetes. Untreated obstructive sleep apnea causes sleep disruption, nonrestorative sleep, and sleep deprivation. This can have an impact on your day to day functioning and cause daytime sleepiness and impairment of cognitive function, memory loss, mood disturbance, and problems focussing. Using CPAP regularly can improve these symptoms.  For your tourette's, we will continue Tenex 1 mg, 2 pills each bedtime.

## 2014-02-03 ENCOUNTER — Ambulatory Visit: Payer: Medicare Other | Admitting: Neurology

## 2014-06-10 ENCOUNTER — Ambulatory Visit: Payer: Medicare Other | Admitting: Neurology

## 2014-07-28 ENCOUNTER — Encounter: Payer: Self-pay | Admitting: Neurology

## 2014-07-29 ENCOUNTER — Encounter: Payer: Self-pay | Admitting: Neurology

## 2014-07-29 ENCOUNTER — Encounter (INDEPENDENT_AMBULATORY_CARE_PROVIDER_SITE_OTHER): Payer: Self-pay

## 2014-07-29 ENCOUNTER — Ambulatory Visit (INDEPENDENT_AMBULATORY_CARE_PROVIDER_SITE_OTHER): Payer: Medicare Other | Admitting: Neurology

## 2014-07-29 VITALS — BP 108/67 | HR 66 | Temp 97.9°F | Ht 68.0 in | Wt 244.0 lb

## 2014-07-29 DIAGNOSIS — G4733 Obstructive sleep apnea (adult) (pediatric): Secondary | ICD-10-CM

## 2014-07-29 DIAGNOSIS — G4731 Primary central sleep apnea: Secondary | ICD-10-CM | POA: Diagnosis not present

## 2014-07-29 DIAGNOSIS — F952 Tourette's disorder: Secondary | ICD-10-CM

## 2014-07-29 MED ORDER — GUANFACINE HCL 1 MG PO TABS: 2.0000 mg | ORAL_TABLET | Freq: Every day | ORAL | Status: DC

## 2014-07-29 NOTE — Patient Instructions (Addendum)
Please continue using your BiPAP regularly. While your insurance requires that you use biPAP at least 4 hours each night on 70% of the nights, I recommend, that you not skip any nights and use it throughout the night if you can. Getting used to biPAP and staying with the treatment long term does take time and patience and discipline. Untreated obstructive sleep apnea when it is moderate to severe can have an adverse impact on cardiovascular health and raise her risk for heart disease, arrhythmias, hypertension, congestive heart failure, stroke and diabetes. Untreated obstructive sleep apnea causes sleep disruption, nonrestorative sleep, and sleep deprivation. This can have an impact on your day to day functioning and cause daytime sleepiness and impairment of cognitive function, memory loss, mood disturbance, and problems focussing. Using BiPAP regularly can improve these symptoms.  Please do not skip any nights, as you had severe drops in your oxygen level without treatment.   We will continue with Tenex 2 mg each night for your Tourette's.

## 2014-07-29 NOTE — Progress Notes (Signed)
Subjective:    Patient ID: [name] is a 73 y.o. male.  HPI    Interim history:   [name] is a very pleasant 73 year old right-handed gentleman with an underlying medical history of coronary artery disease (s/p angioplasty in 1996 and stent in 1997), s/p back surgeries x 2 at [facility], hyperlipidemia, obesity, and Tourette's syndrome, who presents for followup consultation of his TS and his severe mixed sleep apnea on BiPAP therapy. He is accompanied by his wife today. I last saw him on 01/27/2014, at which time I suggested he continue with Tenex 2 mg each night and he was encouraged to continue with BiPAP therapy. Today, I reviewed his compliance data from 06/28/1999 1530 07/26/2014 which is a total of 30 days during which time he used his BiPAP on 25 nights. Percent used days greater than 4 hours was 63%, indicating suboptimal compliance. Average usage of 3 hours and 58 minutes. Residual AHI at 3.7 indicating inadequate pressure setting of 15/11. His leak continued to be quite high many times with the 95th percentile at 76.2 L per minute.  Today, he reports, that he is compliant with treatment, but he has recently taken a trip to the mountains and did not take his machine with him but did "miss it". He feels that the increase in Tenex in the recent past had improved his tooth grinding and has jaw clenching. He has no new complaints today.  I saw him on 08/05/2013, at which time we talked about his sleep study results and his compliance data. He indicated improvement in his daytime somnolence and sleep consolidation but his compliance was suboptimal at the time. I encouraged him to use BiPAP regularly. He reported improvement of his tics with Tenex. He denied major side effects. I suggested he gradually increase it to 2 mg each night.   I reviewed the patient's BiPAP compliance data from 11/17/2013 to 12/16/2013, which is a total of 30 days, during which time the patient used BiPAP every day.  The average usage for all days was 5 hours and 29 minutes. The percent used days greater than 4 hours was 77 %, indicating good compliance. The residual AHI was 3.1 per hour, indicating an adequate treatment pressure of 15/11 cm.  I reviewed his compliance data from 12/28/2013 through 01/26/2014 which is a total of 30 days during which time he used BiPAP every night except for one night. Percent used days greater than 4 hours was 80%, indicating very good compliance, average usage was 5 hours and 7 minutes. His residual AHI is 3.1 which is in the desired range. He is on a pressure of 15/11 cm with spontaneous mode. His leak was high at times.  I first met him on 04/01/2013 for his Hx of TS, at which time he also reported a prior diagnosis of severe OSA, for which he had never been treated. I suggested a trial of Tenex for his Tourette's syndrome. I asked him to come back for sleep study. He had a split-night sleep study on 04/23/2013: His baseline sleep efficiency was reduced at 62.2% with a latency to sleep of 4 minutes and wake after sleep onset of 17 minutes with severe sleep fragmentation noted. He had a increased percentage of stage I sleep, I reduced percentage of stage II sleep, absence of slow-wave sleep and absence of REM sleep prior to CPAP. His baseline oxygen saturation was 91%, his nadir was 73%. He was noted to have mild to moderate snoring. His AHI  was 64 per hour. He was then titrated on CPAP with pressures of 5-12 cm. He had ongoing central apneas and was therefore switched to BiPAP and titrated from 14/10 cm to 15/11 cm. His AHI was reduced to 7.5 events at that pressure. Based on the test results I prescribed BiPAP for the patient at a pressure of 15/11 cm with a large size nasal pillows mask. I reviewed compliance data from 06/15/2013 through 08/02/2013 (49 days), during which time he used it 35 days (71%). Percent used days greater than 4 hours was only 47%. Average usage for all days was 3  hours and 10 minutes an average usage for days use was 4 hours and 26 minutes. His residual AHI was 3.5 per hour, indicating an appropriate BiPAP pressure of 15/11 cm with "easy-breathe" on.  He reported not taking his machine when he goes to the mountains. He felt better with regards to his sleep. He had no major difficulty tolerating BiPAP at this pressure. He had been going to bed late, around 2 AM by habit.  He has had improvement in his tics, which have primarily been jaw clenching and phonic tics with Tenex, which was initially at 1 mg qHS. He had no major side effects.  He started having having phonic tic at age 28. He saw Dr. Cathie Olden at [facility] for CO2 treatments. He had ongoing motor and phonic tics and also had a period of coprolalia over the years and was diagnosed with Tourette's syndrome at age 62 by Dr. Bearl Mulberry, Neurologist at [facility]. 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 His youngest son has tourettes per patient. The patient currently has jaw clenching and tooth grinding tics, neck jerking tics, and grunting and coughing tics.  He had different motor tics and phonic tics in the past. He was treated in the past with Haldol, but he was not on any other meds for this. He has had some balance problems, and fell recently, with no injury to head or otherwise, no LOC.   His Past Medical History Is Significant For: Past Medical History  Diagnosis Date  . Tourette syndrome   . Diabetes   . Tourette's syndrome 04/01/2013  . OSA (obstructive sleep apnea) 08/05/2013    His Past Surgical History Is Significant For: Past Surgical History  Procedure Laterality Date  . Cataract extraction  11/2012  . Heart stent  01/16/1996    His Family History Is Significant For: History reviewed. No pertinent family history.  His Social History Is Significant For: History   Social History  . Marital Status: Married    Spouse Name: Edd Fabian    Number of Children: 3  . Years of Education: college    Social History Main Topics  . Smoking status: Former Research scientist (life sciences)  . Smokeless tobacco: Never Used  . Alcohol Use: No  . Drug Use: No  . Sexual Activity: None   Other Topics Concern  . None   Social History Narrative   Patient consumes 2 cups of coffee daily    His Allergies Are:  No Known Allergies:   His Current Medications Are:  Outpatient Encounter Prescriptions as of 07/29/2014  Medication Sig  . aspirin (ECOTRIN LOW STRENGTH) 81 MG EC tablet Take 81 mg by mouth daily. Swallow whole.  Marland Kitchen guanFACINE (TENEX) 1 MG tablet Take 2 tablets (2 mg total) by mouth at bedtime. 2 pills each bedtime.  Marland Kitchen KOMBIGLYZE XR 02-999 MG TB24 Take 1 tablet by mouth daily.   Marland Kitchen  metoprolol succinate (TOPROL-XL) 25 MG 24 hr tablet Take 25 mg by mouth 2 (two) times daily. One am , one pm  . simvastatin (ZOCOR) 40 MG tablet Take 40 mg by mouth daily at 6 PM.   . [DISCONTINUED] guanFACINE (TENEX) 1 MG tablet Take 2 tablets (2 mg total) by mouth at bedtime. 2 pills each bedtime.  :  Review of Systems:  Out of a complete 14 point review of systems, all are reviewed and negative with the exception of these symptoms as listed below:   Review of Systems  Eyes: Positive for itching.    Objective:  Neurologic Exam  Physical Exam Physical Examination:   Filed Vitals:   07/29/14 1427  BP: 108/67  Pulse: 66  Temp: 97.9 F (36.6 C)    General Examination: The patient is a very pleasant 73 yo. male in no acute distress. He appears well-developed and well-nourished, obese and well groomed. He is in good spirits today.   HEENT: He has intermittent jaw clenching tics and less neck flexion tics and grunting and less yelling tics and no coughing or sniffing tics today. His jaw clenching and teeth chattering is less today. He has rare facial grimacing tics and eye rolling tics. His tics appear better than last time. Normocephalic, atraumatic, pupils are equal, round and reactive to light and accommodation.  Extraocular tracking is good without limitation to gaze excursion or nystagmus noted. Normal smooth pursuit is noted. Hearing is grossly intact. Face is symmetric with normal facial animation and normal facial sensation. Speech is clear with no dysarthria noted. There is no hypophonia. There is no lip, neck/head, jaw or voice tremor. Neck is supple with full range of passive and active motion. There are no carotid bruits on auscultation. Oropharynx exam reveals: mild mouth dryness, adequate dental hygiene and severe airway crowding, due to narrow airway, large uvula, thick soft palate. Mallampati is class III. Tongue protrudes centrally and palate elevates symmetrically.   Chest: Clear to auscultation without wheezing, rhonchi or crackles noted.  Heart: S1+S2+0, regular and normal without murmurs, rubs or gallops noted.   Abdomen: Soft, non-tender and non-distended with normal bowel sounds appreciated on auscultation.  Extremities: There is trace pitting edema in the distal lower extremities bilaterally. Pedal pulses are intact.  Skin: Warm and dry without trophic changes noted. There are no varicose veins, but has spider veins.  Musculoskeletal: exam reveals no obvious joint deformities, tenderness or joint swelling or erythema.   Neurologically:  Mental status: The patient is awake, alert and oriented in all 4 spheres. His memory, attention, language and knowledge are appropriate. There is no aphasia, agnosia, apraxia or anomia. Speech is clear with normal prosody and enunciation with intermittent grunting, sniffing, yelling out, throat clearing, coughing tics. Thought process is linear. Mood is congruent and affect is normal.  Cranial nerves are as described above under HEENT exam. In addition, shoulder shrug is normal with equal shoulder height noted. Motor exam: Normal bulk, strength and tone is noted. Tics are described above, no appendicular tics. There is no drift, tremor or rebound. Romberg  is negative. Reflexes are 1+ in the UEs and trace in the LEs. Toes are downgoing bilaterally. Fine motor skills are intact with normal finger taps, normal hand movements, normal rapid alternating patting, normal foot taps and normal foot agility.  Cerebellar testing shows no dysmetria or intention tremor on finger to nose testing. There is no truncal or gait ataxia.  Sensory exam is intact to light touch, PP  and temperature, and vibration.  Gait, station and balance are unremarkable. No veering to one side is noted. No leaning to one side is noted. Posture is age-appropriate and stance is narrow based. No problems turning are noted. He turns en bloc.               Assessment and Plan:   In summary, KOURTNEY MONTESINOS is a very pleasant 73 year old male with an underlying history of diabetes and obesity who presents for followup consultation of his Tourette's syndrome as well as severe OSA with a central component. His physical exam is c/w phonic and motor tics, stable or a little better from last time. His exam is overwise non-focal with the exception of obesity and a tight airway. He is on BiPAP treatment for his complex sleep apnea at a pressure of 15/11 cwp with good results, as far as his daytime somnolence and sleep consolidation. He has good compliance, but increase in air leak, but this may be due to mask wear and tear, as he has not had a new mask since 10/14 he admits. I reviewed his compliance data with him and his wife and his sleep study from 6/14 and his hypnogram as well today. His compliance had improved over time and I congratulated him in that regard. He has had improvement in his tics with Tenex. He has no major side effects. I encouraged the patient to eat healthy, exercise daily and keep well hydrated, to keep a scheduled bedtime and wake time routine, to not skip any meals and eat healthy snacks in between meals and to have protein with every meal. I stressed the importance of regular  exercise. We will continue with Tenex at this time. I asked him to be fully compliant with BiPAP therapy and not to skip any days. I ordered new supplies for his BiPAP. I would like see him back in 6 months from now, sooner if the need arises and encouraged them to call with any interim questions, concerns or problems. I provided him with a 90 day prescription for Tenex.

## 2014-08-05 DIAGNOSIS — Z23 Encounter for immunization: Secondary | ICD-10-CM | POA: Diagnosis not present

## 2014-09-09 DIAGNOSIS — E119 Type 2 diabetes mellitus without complications: Secondary | ICD-10-CM | POA: Diagnosis not present

## 2014-09-09 DIAGNOSIS — H25011 Cortical age-related cataract, right eye: Secondary | ICD-10-CM | POA: Diagnosis not present

## 2014-09-09 DIAGNOSIS — D3131 Benign neoplasm of right choroid: Secondary | ICD-10-CM | POA: Diagnosis not present

## 2014-09-09 DIAGNOSIS — H2511 Age-related nuclear cataract, right eye: Secondary | ICD-10-CM | POA: Diagnosis not present

## 2015-01-12 ENCOUNTER — Telehealth: Payer: Self-pay | Admitting: Neurology

## 2015-01-12 NOTE — Telephone Encounter (Signed)
Confirmed appointment with patient.

## 2015-01-28 ENCOUNTER — Ambulatory Visit: Payer: Medicare Other | Admitting: Neurology

## 2015-02-01 ENCOUNTER — Encounter: Payer: Self-pay | Admitting: Neurology

## 2015-02-01 ENCOUNTER — Ambulatory Visit (INDEPENDENT_AMBULATORY_CARE_PROVIDER_SITE_OTHER): Payer: Medicare Other | Admitting: Neurology

## 2015-02-01 VITALS — BP 114/68 | HR 59 | Resp 16 | Ht 68.0 in | Wt 245.0 lb

## 2015-02-01 DIAGNOSIS — G4733 Obstructive sleep apnea (adult) (pediatric): Secondary | ICD-10-CM | POA: Diagnosis not present

## 2015-02-01 DIAGNOSIS — F952 Tourette's disorder: Secondary | ICD-10-CM

## 2015-02-01 DIAGNOSIS — G4731 Primary central sleep apnea: Secondary | ICD-10-CM | POA: Diagnosis not present

## 2015-02-01 NOTE — Progress Notes (Signed)
Subjective:    Patient ID: [name] is a 74 y.o. male.  HPI     Interim history:   [name] is a very pleasant 74 year old right-handed gentleman with an underlying medical history of coronary artery disease (s/p angioplasty in 1996 and stent in 1997), s/p back surgeries x 2 at [facility], hyperlipidemia, obesity, and Tourette's syndrome, who presents for followup consultation of his TS and his severe mixed sleep apnea on BiPAP therapy. He is accompanied by his wife again today. I last saw him on 07/29/2014, at which time he reported compliance with BiPAP treatment but he had taken a trip to the mountains and was not able to use his machine. He felt that the increase in Tenex in the recent past had improved his tooth grinding and jaw clenching.   Today, 02/01/2015: I reviewed his BiPAP compliance data from 12/27/2014 through 01/25/2015 which is a total of 30 days during which time he used his machine every night. Percent used days greater than 4 hours was 97%, indicating excellent compliance. Average usage was 5 hours and 47 minutes, residual AHI low at 1.4 per hour and leak acceptable with the 95th percentile at 20.6 L/m on a pressure of 15/11 cm.  Today, 02/01/2015: He is able to provide his own history. His wife supplements the history. He reports that he is faithful with the BiPAP and he has noted air leaking. He needs new supplies. His tics are under reasonable control with Tenex 2 mg each night. He does not drink enough water. He needs to see his PCP sometime in July. He will have blood work then.   Previously: I saw him on 01/27/2014, at which time I suggested he continue with Tenex 2 mg each night and he was encouraged to continue with BiPAP therapy.  I reviewed his compliance data from 06/28/1999 1530 07/26/2014 which is a total of 30 days during which time he used his BiPAP on 25 nights. Percent used days greater than 4 hours was 63%, indicating suboptimal compliance. Average usage of  3 hours and 58 minutes. Residual AHI at 3.7 indicating inadequate pressure setting of 15/11. His leak continued to be quite high many times with the 95th percentile at 76.2 L per minute.  I saw him on 08/05/2013, at which time we talked about his sleep study results and his compliance data. He indicated improvement in his daytime somnolence and sleep consolidation but his compliance was suboptimal at the time. I encouraged him to use BiPAP regularly. He reported improvement of his tics with Tenex. He denied major side effects. I suggested he gradually increase it to 2 mg each night.    I reviewed the patient's BiPAP compliance data from 11/17/2013 to 12/16/2013, which is a total of 30 days, during which time the patient used BiPAP every day. The average usage for all days was 5 hours and 29 minutes. The percent used days greater than 4 hours was 77 %, indicating good compliance. The residual AHI was 3.1 per hour, indicating an adequate treatment pressure of 15/11 cm.   I reviewed his compliance data from 12/28/2013 through 01/26/2014 which is a total of 30 days during which time he used BiPAP every night except for one night. Percent used days greater than 4 hours was 80%, indicating very good compliance, average usage was 5 hours and 7 minutes. His residual AHI is 3.1 which is in the desired range. He is on a pressure of 15/11 cm with spontaneous mode. His leak  was high at times.   I first met him on 04/01/2013 for his Hx of TS, at which time he also reported a prior diagnosis of severe OSA, for which he had never been treated. I suggested a trial of Tenex for his Tourette's syndrome. I asked him to come back for sleep study. He had a split-night sleep study on 04/23/2013: His baseline sleep efficiency was reduced at 62.2% with a latency to sleep of 4 minutes and wake after sleep onset of 17 minutes with severe sleep fragmentation noted. He had a increased percentage of stage I sleep, I reduced percentage of  stage II sleep, absence of slow-wave sleep and absence of REM sleep prior to CPAP. His baseline oxygen saturation was 91%, his nadir was 73%. He was noted to have mild to moderate snoring. His AHI was 64 per hour. He was then titrated on CPAP with pressures of 5-12 cm. He had ongoing central apneas and was therefore switched to BiPAP and titrated from 14/10 cm to 15/11 cm. His AHI was reduced to 7.5 events at that pressure. Based on the test results I prescribed BiPAP for the patient at a pressure of 15/11 cm with a large size nasal pillows mask. I reviewed compliance data from 06/15/2013 through 08/02/2013 (49 days), during which time he used it 35 days (71%). Percent used days greater than 4 hours was only 47%. Average usage for all days was 3 hours and 10 minutes an average usage for days use was 4 hours and 26 minutes. His residual AHI was 3.5 per hour, indicating an appropriate BiPAP pressure of 15/11 cm with "easy-breathe" on.   He reported not taking his machine when he goes to the mountains. He felt better with regards to his sleep. He had no major difficulty tolerating BiPAP at this pressure. He had been going to bed late, around 2 AM by habit.   He has had improvement in his tics, which have primarily been jaw clenching and phonic tics with Tenex, which was initially at 1 mg qHS. He had no major side effects.   He started having having phonic tic at age 40. He saw Dr. Lyman Bishop at [facility] for CO2 treatments. He had ongoing motor and phonic tics and also had a period of coprolalia over the years and was diagnosed with Tourette's syndrome at age 6 by Dr. Kathrynn Ducking, Neurologist at [facility]. 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 His youngest son has tourettes per patient. The patient currently has jaw clenching and tooth grinding tics, neck jerking tics, and grunting and coughing tics.   He had different motor tics and phonic tics in the past. He was treated in the past with Haldol, but he was not on any  other meds for this. He has had some balance problems, and fell recently, with no injury to head or otherwise, no LOC.   His Past Medical History Is Significant For: Past Medical History  Diagnosis Date  . Tourette syndrome   . Diabetes   . Tourette's syndrome 04/01/2013  . OSA (obstructive sleep apnea) 08/05/2013    His Past Surgical History Is Significant For: Past Surgical History  Procedure Laterality Date  . Cataract extraction  11/2012  . Heart stent  01/16/1996    His Family History Is Significant For: Family History  Problem Relation Age of Onset  . Pancreatic cancer Mother   . Heart disease Father     His Social History Is Significant For: History  Social History  . Marital Status: Married    Spouse Name: Edd Fabian  . Number of Children: 3  . Years of Education: college   Social History Main Topics  . Smoking status: Former Research scientist (life sciences)  . Smokeless tobacco: Never Used     Comment: Quit over 20 years ago  . Alcohol Use: No  . Drug Use: No  . Sexual Activity: Not on file   Other Topics Concern  . None   Social History Narrative   Patient consumes 2 cups of coffee daily    His Allergies Are:  No Known Allergies:   His Current Medications Are:  Outpatient Encounter Prescriptions as of 02/01/2015  Medication Sig  . aspirin (ECOTRIN LOW STRENGTH) 81 MG EC tablet Take 81 mg by mouth daily. Swallow whole.  Marland Kitchen guanFACINE (TENEX) 1 MG tablet Take 2 tablets (2 mg total) by mouth at bedtime. 2 pills each bedtime.  Marland Kitchen KOMBIGLYZE XR 02-999 MG TB24 Take 1 tablet by mouth daily.   . metoprolol succinate (TOPROL-XL) 25 MG 24 hr tablet Take 25 mg by mouth 2 (two) times daily. One am , one pm  . simvastatin (ZOCOR) 40 MG tablet Take 40 mg by mouth daily at 6 PM.   :  Review of Systems:  Out of a complete 14 point review of systems, all are reviewed and negative with the exception of these symptoms as listed below:   Review of Systems  All other systems reviewed and are  negative.   Objective:  Neurologic Exam  Physical Exam Physical Examination:   Filed Vitals:   02/01/15 0927  BP: 114/68  Pulse: 59  Resp: 16    General Examination: The patient is a very pleasant 74 yo. male in no acute distress. He appears well-developed and well-nourished, obese and well groomed. He is in good spirits today.   HEENT: He has intermittent jaw clenching tics and very few neck flexion tics and rare grunting and rare yelling out tics and no coughing or sniffing tics today. His jaw clenching and teeth chattering is overall much less than before. He has rare facial grimacing tics and eye rolling tics. His tics appear better than even last time. Normocephalic, except for a cystic appearance on the top of his scalp on the L, atraumatic, pupils are equal, round and reactive to light and accommodation. Extraocular tracking is good without limitation to gaze excursion or nystagmus noted. Normal smooth pursuit is noted. Hearing is grossly intact. Face is symmetric with normal facial animation and normal facial sensation. Speech is clear with no dysarthria noted. There is no hypophonia. There is no lip, neck/head, jaw or voice tremor. Neck is supple with full range of passive and active motion. There are no carotid bruits on auscultation. Oropharynx exam reveals: mild mouth dryness, adequate dental hygiene and severe airway crowding, due to narrow airway, large uvula, thick soft palate. Mallampati is class III. Tongue protrudes centrally and palate elevates symmetrically.   Chest: Clear to auscultation without wheezing, rhonchi or crackles noted.  Heart: S1+S2+0, regular and normal without murmurs, rubs or gallops noted.   Abdomen: Soft, non-tender and non-distended with normal bowel sounds appreciated on auscultation.  Extremities: There is trace pitting edema in the distal lower extremities bilaterally, R more than. Pedal pulses are intact.  Skin: Warm and dry without trophic  changes noted. There are no varicose veins, but has spider veins.  Musculoskeletal: exam reveals no obvious joint deformities, tenderness or joint swelling or erythema.   Neurologically:  Mental status: The patient is awake, alert and oriented in all 4 spheres. His memory, attention, language and knowledge are appropriate. There is no aphasia, agnosia, apraxia or anomia. Speech is clear with normal prosody and enunciation with intermittent grunting, sniffing, yelling out, throat clearing, coughing tics. Thought process is linear. Mood is congruent and affect is normal.  Cranial nerves are as described above under HEENT exam. In addition, shoulder shrug is normal with equal shoulder height noted. Motor exam: Normal bulk, strength and tone is noted. Tics are described above, no appendicular tics. There is no drift, tremor or rebound. Romberg is negative. Reflexes are 1+ in the UEs and trace in the LEs. Toes are downgoing bilaterally. Fine motor skills are intact with normal finger taps, normal hand movements, normal rapid alternating patting, normal foot taps and normal foot agility.  Cerebellar testing shows no dysmetria or intention tremor on finger to nose testing. There is no truncal or gait ataxia.  Sensory exam is intact to light touch, PP and temperature, and vibration with slight reduction in PP and temperature in the distal LEs bilaterally.  Gait, station and balance are unremarkable. No veering to one side is noted. No leaning to one side is noted. Posture is age-appropriate and stance is narrow based. No problems turning are noted. He turns en bloc.               Assessment and Plan:   In summary, SYLVIA KONDRACKI is a very pleasant 75 year old male with an underlying history of diabetes and obesity who presents for followup consultation of his Tourette's syndrome as well as severe OSA with a central component. His physical exam is c/w phonic and motor tics, and seems better from before. His  exam is overwise non-focal with the exception of obesity, a tight airway and mild signs of diabetic neuropathy. He is on BiPAP treatment for his complex sleep apnea at a pressure of 15/11 cwp with good results, as far as his daytime somnolence and sleep consolidation and nocturia. He has excellent compliance, and good air leak, but needs new supplies, which I ordered. I reviewed his previous test results and his recent compliance data with him and his wife and his sleep study from 6/14 and his hypnogram as well today. His compliance has improved over time and I congratulated him in that regard. He has had improvement in his tics with Tenex at the current dose. He has no major side effects. I encouraged the patient to eat healthy, exercise daily and keep well hydrated, to keep a scheduled bedtime and wake time routine, to not skip any meals and eat healthy snacks in between meals and to have protein with every meal. I stressed the importance of regular exercise. His wt has been stable. We will continue with Tenex at this time. I asked him to continue to be fully compliant with BiPAP therapy ordered new supplies for his BiPAP. I would like see him back in 6 months from now, sooner if the need arises and encouraged them to call with any interim questions, concerns or problems. He did not need a prescription for Tenex today. I spent 25 minutes in total face-to-face time with the patient, more than 50% of which was spent in counseling and coordination of care, reviewing test results, reviewing medication and discussing or reviewing the diagnosis of OSA and TS, the prognosis and treatment options.

## 2015-02-01 NOTE — Patient Instructions (Signed)
We will continue with Tenex at the current dose.  Please continue to use your BiPAP machine as well as you are now. Keep up the good work! I will see you back in 6 months for check up.

## 2015-02-21 ENCOUNTER — Encounter: Payer: Self-pay | Admitting: Neurology

## 2015-02-21 DIAGNOSIS — S61002A Unspecified open wound of left thumb without damage to nail, initial encounter: Secondary | ICD-10-CM | POA: Diagnosis not present

## 2015-08-09 ENCOUNTER — Encounter: Payer: Self-pay | Admitting: Neurology

## 2015-08-09 ENCOUNTER — Ambulatory Visit (INDEPENDENT_AMBULATORY_CARE_PROVIDER_SITE_OTHER): Payer: Medicare Other | Admitting: Neurology

## 2015-08-09 VITALS — BP 118/62 | HR 70 | Resp 18 | Ht 68.0 in | Wt 247.0 lb

## 2015-08-09 DIAGNOSIS — Z23 Encounter for immunization: Secondary | ICD-10-CM | POA: Diagnosis not present

## 2015-08-09 DIAGNOSIS — F952 Tourette's disorder: Secondary | ICD-10-CM | POA: Diagnosis not present

## 2015-08-09 DIAGNOSIS — G4731 Primary central sleep apnea: Secondary | ICD-10-CM | POA: Diagnosis not present

## 2015-08-09 DIAGNOSIS — E669 Obesity, unspecified: Secondary | ICD-10-CM | POA: Diagnosis not present

## 2015-08-09 DIAGNOSIS — G4733 Obstructive sleep apnea (adult) (pediatric): Secondary | ICD-10-CM | POA: Diagnosis not present

## 2015-08-09 MED ORDER — GUANFACINE HCL 1 MG PO TABS: 2.0000 mg | ORAL_TABLET | Freq: Every day | ORAL | Status: DC

## 2015-08-09 NOTE — Progress Notes (Signed)
Subjective:    Patient ID: [name] is a 74 y.o. male.  HPI     Interim history:   [name] is a very pleasant 74 year old right-handed gentleman with an underlying medical history of coronary artery disease (s/p angioplasty in 1996 and stent in 1997), s/p back surgeries( x 2 at [facility]), hyperlipidemia, obesity, and Tourette's syndrome, who presents for followup consultation of his TS and his severe mixed sleep apnea, on treatment with BiPAP therapy. He is accompanied by his wife again today. I last saw him on 02/01/2015, at which time he reported compliance with BiPAP therapy, but he had issues with mask air leaking. He needed new supplies. His tics were under reasonable control with Tenex 2 mg each night. He was not always drinking enough water. I suggested we continue with the current dose of Tenex and that he continue with BiPAP at the current settings. He was completely compliant with treatment.  Today, 08/09/2015: Reviewed his BiPAP compliance data from 07/08/2015 through 08/06/2015 which is a total of 30 days during which time he used his BiPAP every night with percent used days greater than 4 hours was 90%, indicating excellent compliance with an average usage of 5 hours and 36 minutes, residual AHI low at 1.7 per hour, leak for the most part acceptable with the 95th percentile at 24.7 L/m on a pressure of 15/11 cm.  Today, 08/09/15: He reports doing well, compliant with treatment, she has noted some short term memory issues with him, but nothing sinister, mostly forgetfulness and sometimes misplacing things. His tics flare up with shaving, and when talking with people. His dentist made him a bite guard, but it was uncomfortable. His weight is stable. He tolerates the Tenex.   Previously:  I saw him on 07/29/2014, at which time he reported compliance with BiPAP treatment but he had taken a trip to the mountains and was not able to use his machine. He felt that the increase in Tenex  in the recent past had improved his tooth grinding and jaw clenching.    I reviewed his BiPAP compliance data from 12/27/2014 through 01/25/2015 which is a total of 30 days during which time he used his machine every night. Percent used days greater than 4 hours was 97%, indicating excellent compliance. Average usage was 5 hours and 47 minutes, residual AHI low at 1.4 per hour and leak acceptable with the 95th percentile at 20.6 L/m on a pressure of 15/11 cm.  I saw him on 01/27/2014, at which time I suggested he continue with Tenex 2 mg each night and he was encouraged to continue with BiPAP therapy.  I reviewed his compliance data from 06/27/2014 to 07/26/2014 which is a total of 30 days during which time he used his BiPAP on 25 nights. Percent used days greater than 4 hours was 63%, indicating suboptimal compliance. Average usage of 3 hours and 58 minutes. Residual AHI at 3.7 indicating inadequate pressure setting of 15/11. His leak continued to be quite high many times with the 95th percentile at 76.2 L per minute.  I saw him on 08/05/2013, at which time we talked about his sleep study results and his compliance data. He indicated improvement in his daytime somnolence and sleep consolidation but his compliance was suboptimal at the time. I encouraged him to use BiPAP regularly. He reported improvement of his tics with Tenex. He denied major side effects. I suggested he gradually increase it to 2 mg each night.    I  reviewed the patient's BiPAP compliance data from 11/17/2013 to 12/16/2013, which is a total of 30 days, during which time the patient used BiPAP every day. The average usage for all days was 5 hours and 29 minutes. The percent used days greater than 4 hours was 77 %, indicating good compliance. The residual AHI was 3.1 per hour, indicating an adequate treatment pressure of 15/11 cm.   I reviewed his compliance data from 12/28/2013 through 01/26/2014 which is a total of 30 days during which  time he used BiPAP every night except for one night. Percent used days greater than 4 hours was 80%, indicating very good compliance, average usage was 5 hours and 7 minutes. His residual AHI is 3.1 which is in the desired range. He is on a pressure of 15/11 cm with spontaneous mode. His leak was high at times.   I first met him on 04/01/2013 for his Hx of TS, at which time he also reported a prior diagnosis of severe OSA, for which he had never been treated. I suggested a trial of Tenex for his Tourette's syndrome. I asked him to come back for sleep study. He had a split-night sleep study on 04/23/2013: His baseline sleep efficiency was reduced at 62.2% with a latency to sleep of 4 minutes and wake after sleep onset of 17 minutes with severe sleep fragmentation noted. He had a increased percentage of stage I sleep, I reduced percentage of stage II sleep, absence of slow-wave sleep and absence of REM sleep prior to CPAP. His baseline oxygen saturation was 91%, his nadir was 73%. He was noted to have mild to moderate snoring. His AHI was 64 per hour. He was then titrated on CPAP with pressures of 5-12 cm. He had ongoing central apneas and was therefore switched to BiPAP and titrated from 14/10 cm to 15/11 cm. His AHI was reduced to 7.5 events at that pressure. Based on the test results I prescribed BiPAP for the patient at a pressure of 15/11 cm with a large size nasal pillows mask. I reviewed compliance data from 06/15/2013 through 08/02/2013 (49 days), during which time he used it 35 days (71%). Percent used days greater than 4 hours was only 47%. Average usage for all days was 3 hours and 10 minutes an average usage for days use was 4 hours and 26 minutes. His residual AHI was 3.5 per hour, indicating an appropriate BiPAP pressure of 15/11 cm with "easy-breathe" on.   He reported not taking his machine when he goes to the mountains. He felt better with regards to his sleep. He had no major difficulty tolerating  BiPAP at this pressure. He had been going to bed late, around 2 AM by habit.   He has had improvement in his tics, which have primarily been jaw clenching and phonic tics with Tenex, which was initially at 1 mg qHS. He had no major side effects.   He started having having phonic tic at age 48. He saw Dr. Cathie Olden at [facility] for CO2 treatments. He had ongoing motor and phonic tics and also had a period of coprolalia over the years and was diagnosed with Tourette's syndrome at age 47 by Dr. Bearl Mulberry, Neurologist at [facility]. 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 His youngest son has tourettes per patient. The patient currently has jaw clenching and tooth grinding tics, neck jerking tics, and grunting and coughing tics.   He had different motor tics and phonic tics in the past.  He was treated in the past with Haldol, but he was not on any other meds for this. He has had some balance problems, and fell recently, with no injury to head or otherwise, no LOC.   His Past Medical History Is Significant For: Past Medical History  Diagnosis Date  . Tourette syndrome   . Diabetes ([place])   . Tourette's syndrome 04/01/2013  . OSA (obstructive sleep apnea) 08/05/2013    His Past Surgical History Is Significant For: Past Surgical History  Procedure Laterality Date  . Cataract extraction  11/2012  . Heart stent  01/16/1996    His Family History Is Significant For: Family History  Problem Relation Age of Onset  . Pancreatic cancer Mother   . Heart disease Father     His Social History Is Significant For: Social History   Social History  . Marital Status: Married    Spouse Name: Edd Fabian  . Number of Children: 3  . Years of Education: college   Social History Main Topics  . Smoking status: Former Research scientist (life sciences)  . Smokeless tobacco: Never Used     Comment: Quit over 20 years ago  . Alcohol Use: No  . Drug Use: No  . Sexual Activity: Not Asked   Other Topics Concern  . None   Social History Narrative    Patient consumes 2 cups of coffee daily    His Allergies Are:  No Known Allergies:   His Current Medications Are:  Outpatient Encounter Prescriptions as of 08/09/2015  Medication Sig  . aspirin (ECOTRIN LOW STRENGTH) 81 MG EC tablet Take 81 mg by mouth daily. Swallow whole.  Marland Kitchen guanFACINE (TENEX) 1 MG tablet Take 2 tablets (2 mg total) by mouth at bedtime. 2 pills each bedtime.  Marland Kitchen KOMBIGLYZE XR 02-999 MG TB24 Take 1 tablet by mouth daily.   . metoprolol succinate (TOPROL-XL) 25 MG 24 hr tablet Take 25 mg by mouth 2 (two) times daily. One am , one pm  . simvastatin (ZOCOR) 40 MG tablet Take 40 mg by mouth daily at 6 PM.    No facility-administered encounter medications on file as of 08/09/2015.  :  Review of Systems:  Out of a complete 14 point review of systems, all are reviewed and negative with the exception of these symptoms as listed below:   Review of Systems  Neurological:       No new concerns. States that he is doing well on CPAP and enjoys using it.     Objective:  Neurologic Exam  Physical Exam Physical Examination:   Filed Vitals:   08/09/15 1100  BP: 118/62  Pulse: 70  Resp: 18    General Examination: The patient is a very pleasant 74 yo. male in no acute distress. He appears well-developed and well-nourished, obese and well groomed. He is in good spirits today, as usual.   HEENT: He has intermittent jaw clenching tics and very few neck flexion tics and rare grunting and rare yelling out tics and some sniffing and eye rolling tics today. He has occasional facial grimacing tics. He is very talkative. Pupils are equal, round and reactive to light and accommodation. He is status post left cataract repair. Extraocular tracking is good without limitation to gaze excursion or nystagmus noted. Normal smooth pursuit is noted. Hearing is grossly intact. Face is symmetric with normal facial animation and normal facial sensation. Speech is clear with no dysarthria noted.  There is no hypophonia. There is no lip, neck/head, jaw or voice  tremor. Neck is supple with full range of passive and active motion. There are no carotid bruits on auscultation. Oropharynx exam reveals: mild mouth dryness, adequate dental hygiene and severe airway crowding, due to narrow airway, large uvula, thick soft palate. Mallampati is class III. Tongue protrudes centrally and palate elevates symmetrically.   Chest: Clear to auscultation without wheezing, rhonchi or crackles noted.  Heart: S1+S2+0, regular and normal without murmurs, rubs or gallops noted.   Abdomen: Soft, non-tender and non-distended with normal bowel sounds appreciated on auscultation.  Extremities: There is trace pitting edema in the distal lower extremities bilaterally, R more than L, unchanged. Pedal pulses are intact.  Skin: Warm and dry without trophic changes noted. There are no varicose veins, but has spider veins.  Musculoskeletal: exam reveals no obvious joint deformities, tenderness or joint swelling or erythema.   Neurologically:   Mental status: The patient is awake, alert and oriented in all 4 spheres. His memory, attention, language and knowledge are appropriate. There is no aphasia, agnosia, apraxia or anomia. Speech is clear with normal prosody and enunciation with intermittent grunting, sniffing, yelling out, throat clearing, coughing tics. Thought process is linear. Mood is congruent and affect is normal.  Cranial nerves are as described above under HEENT exam. In addition, shoulder shrug is normal with equal shoulder height noted. Motor exam: Normal bulk, strength and tone is noted. Tics are described above, no appendicular tics. There is no drift, tremor or rebound. Romberg is negative. Reflexes are 1+ in the UEs and trace in the LEs. Toes are downgoing bilaterally. Fine motor skills are intact with normal finger taps, normal hand movements, normal rapid alternating patting, normal foot taps and normal  foot agility.  Cerebellar testing shows no dysmetria or intention tremor on finger to nose testing. There is no truncal or gait ataxia.  Sensory exam is intact to light touch, PP and temperature, and vibration with slight reduction in PP and temperature in the distal LEs bilaterally.  Gait, station and balance are unremarkable. No veering to one side is noted. No leaning to one side is noted. Posture is age-appropriate and stance is narrow based. No problems turning are noted. He turns en bloc. Tandem walk is not possible for him.               Assessment and Plan:   In summary, [name] is a very pleasant 74 year old male with an underlying history of diabetes and obesity who presents for followup consultation of his Tourette's syndrome as well as severe OSA with a central component. His physical exam is c/w phonic and motor tics, and is stable. He is on BiPAP treatment for his complex sleep apnea at a pressure of 15/11 cwp with good results, as far as his daytime somnolence and sleep consolidation and nocturia. He has excellent compliance. We reviewed his previous test results and his recent compliance data with him and his wife and his sleep study from 6/14. His compliance has improved greatly over time and I congratulated him in that regard. He has had improvement in his tics with Tenex at the current dose. He has had no side effects. I encouraged the patient to eat healthy, exercise daily and keep well hydrated, to keep a scheduled bedtime and wake time routine, to not skip any meals and eat healthy snacks in between meals and to have protein with every meal. I stressed the importance of regular exercise. His wt has been stable. He is advised to drink  more water and stay well-hydrated. We will continue with Tenex at the current dose. I renewed the prescription. I asked him to continue to be fully compliant with BiPAP therapy. I would like see him back in 6 months from now, sooner if the need  arises and encouraged them to call with any interim questions, concerns or problems.  I spent 25 minutes in total face-to-face time with the patient, more than 50% of which was spent in counseling and coordination of care, reviewing test results, reviewing medication and discussing or reviewing the diagnosis of OSA and TS, the prognosis and treatment options.

## 2015-08-09 NOTE — Patient Instructions (Signed)
Keep up the good work with your BiPAP!  We will continue with tenex 2 mg each night, things are stable.

## 2015-08-09 NOTE — Addendum Note (Signed)
Addended by: Star Age on: 08/09/2015 12:01 PM   Modules accepted: Level of Service

## 2015-08-17 DIAGNOSIS — Z23 Encounter for immunization: Secondary | ICD-10-CM | POA: Diagnosis not present

## 2015-08-17 DIAGNOSIS — E785 Hyperlipidemia, unspecified: Secondary | ICD-10-CM | POA: Diagnosis not present

## 2015-08-17 DIAGNOSIS — I10 Essential (primary) hypertension: Secondary | ICD-10-CM | POA: Diagnosis not present

## 2015-08-17 DIAGNOSIS — E119 Type 2 diabetes mellitus without complications: Secondary | ICD-10-CM | POA: Diagnosis not present

## 2015-08-17 DIAGNOSIS — I2510 Atherosclerotic heart disease of native coronary artery without angina pectoris: Secondary | ICD-10-CM | POA: Diagnosis not present

## 2015-08-17 DIAGNOSIS — E782 Mixed hyperlipidemia: Secondary | ICD-10-CM | POA: Diagnosis not present

## 2015-08-17 DIAGNOSIS — Z125 Encounter for screening for malignant neoplasm of prostate: Secondary | ICD-10-CM | POA: Diagnosis not present

## 2015-09-08 DIAGNOSIS — E119 Type 2 diabetes mellitus without complications: Secondary | ICD-10-CM | POA: Diagnosis not present

## 2015-09-08 DIAGNOSIS — H25011 Cortical age-related cataract, right eye: Secondary | ICD-10-CM | POA: Diagnosis not present

## 2015-09-08 DIAGNOSIS — H524 Presbyopia: Secondary | ICD-10-CM | POA: Diagnosis not present

## 2015-09-08 DIAGNOSIS — H2511 Age-related nuclear cataract, right eye: Secondary | ICD-10-CM | POA: Diagnosis not present

## 2016-01-18 DIAGNOSIS — I2510 Atherosclerotic heart disease of native coronary artery without angina pectoris: Secondary | ICD-10-CM | POA: Diagnosis not present

## 2016-01-18 DIAGNOSIS — I10 Essential (primary) hypertension: Secondary | ICD-10-CM | POA: Diagnosis not present

## 2016-01-18 DIAGNOSIS — E782 Mixed hyperlipidemia: Secondary | ICD-10-CM | POA: Diagnosis not present

## 2016-01-18 DIAGNOSIS — G4733 Obstructive sleep apnea (adult) (pediatric): Secondary | ICD-10-CM | POA: Diagnosis not present

## 2016-01-18 DIAGNOSIS — E119 Type 2 diabetes mellitus without complications: Secondary | ICD-10-CM | POA: Diagnosis not present

## 2016-01-18 DIAGNOSIS — E785 Hyperlipidemia, unspecified: Secondary | ICD-10-CM | POA: Diagnosis not present

## 2016-01-18 DIAGNOSIS — F952 Tourette's disorder: Secondary | ICD-10-CM | POA: Diagnosis not present

## 2016-02-07 ENCOUNTER — Ambulatory Visit: Payer: Medicare Other | Admitting: Neurology

## 2016-02-14 ENCOUNTER — Telehealth: Payer: Self-pay

## 2016-02-14 NOTE — Telephone Encounter (Signed)
LM to bring in BiPAP or memory card from BiPAP to appt tomorrow.

## 2016-02-15 ENCOUNTER — Ambulatory Visit (INDEPENDENT_AMBULATORY_CARE_PROVIDER_SITE_OTHER): Payer: Medicare Other | Admitting: Neurology

## 2016-02-15 ENCOUNTER — Encounter: Payer: Self-pay | Admitting: Neurology

## 2016-02-15 VITALS — BP 122/66 | HR 68 | Resp 18 | Ht 68.0 in | Wt 213.0 lb

## 2016-02-15 DIAGNOSIS — F952 Tourette's disorder: Secondary | ICD-10-CM | POA: Diagnosis not present

## 2016-02-15 DIAGNOSIS — G4731 Primary central sleep apnea: Secondary | ICD-10-CM | POA: Diagnosis not present

## 2016-02-15 DIAGNOSIS — G4733 Obstructive sleep apnea (adult) (pediatric): Secondary | ICD-10-CM

## 2016-02-15 MED ORDER — GUANFACINE HCL 1 MG PO TABS: 2.0000 mg | ORAL_TABLET | Freq: Every day | ORAL | Status: DC

## 2016-02-15 NOTE — Patient Instructions (Signed)
Please continue your BiPAP the way you are.  We will continue the Tenex at the current dose.  You look great. Keep up the good work with the weight loss!

## 2016-02-15 NOTE — Progress Notes (Signed)
Subjective:    Patient ID: [name] is a 76 y.o. male.  HPI     Interim history:   [name] is a very pleasant 75 year old right-handed gentleman with an underlying medical history of coronary artery disease (s/p angioplasty in 1996 and stent in 1997), s/p back surgeries( x 2 at [facility]), hyperlipidemia, obesity, and Tourette's syndrome, who presents for followup consultation of his TS and his severe mixed sleep apnea, on treatment with BiPAP therapy. He is accompanied by his wife again today. I last saw him on 08/09/15, at which time he was fully compliant with his BiPAP, he felt he was doing well. He had some short-term memory issues according to his wife, mostly mild forgetfulness and misplacing things. His tics sometimes would flare up with shaving or when talking with people. His dentist made him a bite guard because he was also grinding his teeth but he felt his bite guard was too uncomfortable. His weight was stable. He was tolerating the Tenex at the current dose and we mutually agreed to continue with the medication as well as with BiPAP.  Today, 02/15/2016: I reviewed his BiPAP compliance data from 01/16/2016 through 02/14/2016 which is a total of 30 days during which time he used his machine every night with percent used days greater than 4 hours at 100%, indicating superb compliance with an average usage of 5 hours and 30 minutes, residual AHI 2.7, leak at times high with the 95th percentile at 41.2 L/m on 15/11 cm.  Today, 02/15/2016: He reports doing rather well. He and his wife have been enrolled at a study at [facility] which promotes healthy lifestyle, exercise, weight loss and memory improvement as I understand. He has indeed lost over 30 pounds in the past 6+ months and feels much improved. He is compliant with his BiPAP and has no complaints about it.  Previously:  I saw him on 02/01/2015, at which time he reported compliance with BiPAP therapy, but he had issues with  mask air leaking. He needed new supplies. His tics were under reasonable control with Tenex 2 mg each night. He was not always drinking enough water. I suggested we continue with the current dose of Tenex and that he continue with BiPAP at the current settings. He was completely compliant with treatment.  I reviewed his BiPAP compliance data from 07/08/2015 through 08/06/2015 which is a total of 30 days during which time he used his BiPAP every night with percent used days greater than 4 hours was 90%, indicating excellent compliance with an average usage of 5 hours and 36 minutes, residual AHI low at 1.7 per hour, leak for the most part acceptable with the 95th percentile at 24.7 L/m on a pressure of 15/11 cm.  I saw him on 07/29/2014, at which time he reported compliance with BiPAP treatment but he had taken a trip to the mountains and was not able to use his machine. He felt that the increase in Tenex in the recent past had improved his tooth grinding and jaw clenching.    I reviewed his BiPAP compliance data from 12/27/2014 through 01/25/2015 which is a total of 30 days during which time he used his machine every night. Percent used days greater than 4 hours was 97%, indicating excellent compliance. Average usage was 5 hours and 47 minutes, residual AHI low at 1.4 per hour and leak acceptable with the 95th percentile at 20.6 L/m on a pressure of 15/11 cm.  I saw him on  01/27/2014, at which time I suggested he continue with Tenex 2 mg each night and he was encouraged to continue with BiPAP therapy.  I reviewed his compliance data from 06/27/2014 to 07/26/2014 which is a total of 30 days during which time he used his BiPAP on 25 nights. Percent used days greater than 4 hours was 63%, indicating suboptimal compliance. Average usage of 3 hours and 58 minutes. Residual AHI at 3.7 indicating inadequate pressure setting of 15/11. His leak continued to be quite high many times with the 95th percentile at 76.2  L per minute.  I saw him on 08/05/2013, at which time we talked about his sleep study results and his compliance data. He indicated improvement in his daytime somnolence and sleep consolidation but his compliance was suboptimal at the time. I encouraged him to use BiPAP regularly. He reported improvement of his tics with Tenex. He denied major side effects. I suggested he gradually increase it to 2 mg each night.    I reviewed the patient's BiPAP compliance data from 11/17/2013 to 12/16/2013, which is a total of 30 days, during which time the patient used BiPAP every day. The average usage for all days was 5 hours and 29 minutes. The percent used days greater than 4 hours was 77 %, indicating good compliance. The residual AHI was 3.1 per hour, indicating an adequate treatment pressure of 15/11 cm.   I reviewed his compliance data from 12/28/2013 through 01/26/2014 which is a total of 30 days during which time he used BiPAP every night except for one night. Percent used days greater than 4 hours was 80%, indicating very good compliance, average usage was 5 hours and 7 minutes. His residual AHI is 3.1 which is in the desired range. He is on a pressure of 15/11 cm with spontaneous mode. His leak was high at times.   I first met him on 04/01/2013 for his Hx of TS, at which time he also reported a prior diagnosis of severe OSA, for which he had never been treated. I suggested a trial of Tenex for his Tourette's syndrome. I asked him to come back for sleep study. He had a split-night sleep study on 04/23/2013: His baseline sleep efficiency was reduced at 62.2% with a latency to sleep of 4 minutes and wake after sleep onset of 17 minutes with severe sleep fragmentation noted. He had a increased percentage of stage I sleep, I reduced percentage of stage II sleep, absence of slow-wave sleep and absence of REM sleep prior to CPAP. His baseline oxygen saturation was 91%, his nadir was 73%. He was noted to have mild to  moderate snoring. His AHI was 64 per hour. He was then titrated on CPAP with pressures of 5-12 cm. He had ongoing central apneas and was therefore switched to BiPAP and titrated from 14/10 cm to 15/11 cm. His AHI was reduced to 7.5 events at that pressure. Based on the test results I prescribed BiPAP for the patient at a pressure of 15/11 cm with a large size nasal pillows mask. I reviewed compliance data from 06/15/2013 through 08/02/2013 (49 days), during which time he used it 35 days (71%). Percent used days greater than 4 hours was only 47%. Average usage for all days was 3 hours and 10 minutes an average usage for days use was 4 hours and 26 minutes. His residual AHI was 3.5 per hour, indicating an appropriate BiPAP pressure of 15/11 cm with "easy-breathe" on.   He reported not taking  his machine when he goes to the mountains. He felt better with regards to his sleep. He had no major difficulty tolerating BiPAP at this pressure. He had been going to bed late, around 2 AM by habit.   He has had improvement in his tics, which have primarily been jaw clenching and phonic tics with Tenex, which was initially at 1 mg qHS. He had no major side effects.   He started having having phonic tic at age 47. He saw Dr. Cathie Olden at [facility] for CO2 treatments. He had ongoing motor and phonic tics and also had a period of coprolalia over the years and was diagnosed with Tourette's syndrome at age 69 by Dr. Bearl Mulberry, Neurologist at [facility]. 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 His youngest son has tourettes per patient. The patient currently has jaw clenching and tooth grinding tics, neck jerking tics, and grunting and coughing tics.   He had different motor tics and phonic tics in the past. He was treated in the past with Haldol, but he was not on any other meds for this. He has had some balance problems, and fell recently, with no injury to head or otherwise, no LOC.   His Past Medical History Is Significant  For: Past Medical History  Diagnosis Date  . Tourette syndrome   . Diabetes ([place])   . Tourette's syndrome 04/01/2013  . OSA (obstructive sleep apnea) 08/05/2013    His Past Surgical History Is Significant For: Past Surgical History  Procedure Laterality Date  . Cataract extraction  11/2012  . Heart stent  01/16/1996    His Family History Is Significant For: Family History  Problem Relation Age of Onset  . Pancreatic cancer Mother   . Heart disease Father     His Social History Is Significant For: Social History   Social History  . Marital Status: Married    Spouse Name: Edd Fabian  . Number of Children: 3  . Years of Education: college   Social History Main Topics  . Smoking status: Former Research scientist (life sciences)  . Smokeless tobacco: Never Used     Comment: Quit over 20 years ago  . Alcohol Use: No  . Drug Use: No  . Sexual Activity: Not Asked   Other Topics Concern  . None   Social History Narrative   Patient consumes 2 cups of coffee daily    His Allergies Are:  No Known Allergies:   His Current Medications Are:  Outpatient Encounter Prescriptions as of 02/15/2016  Medication Sig  . aspirin (ECOTRIN LOW STRENGTH) 81 MG EC tablet Take 81 mg by mouth daily. Swallow whole.  Marland Kitchen guanFACINE (TENEX) 1 MG tablet Take 2 tablets (2 mg total) by mouth at bedtime. 2 pills each bedtime.  . metoprolol succinate (TOPROL-XL) 25 MG 24 hr tablet Take 25 mg by mouth 2 (two) times daily. One am , one pm  . simvastatin (ZOCOR) 20 MG tablet Take 20 mg by mouth daily.  . [DISCONTINUED] KOMBIGLYZE XR 02-999 MG TB24 Take 1 tablet by mouth daily.   . [DISCONTINUED] simvastatin (ZOCOR) 40 MG tablet Take 40 mg by mouth daily at 6 PM.    No facility-administered encounter medications on file as of 02/15/2016.  :  Review of Systems:  Out of a complete 14 point review of systems, all are reviewed and negative with the exception of these symptoms as listed below:   Review of Systems  Neurological:        Patient reports that  he is doing well on BiPAP. No new concerns.     Objective:  Neurologic Exam  Physical Exam Physical Examination:   Filed Vitals:   02/15/16 0924  BP: 122/66  Pulse: 68  Resp: 18    General Examination: The patient is a very pleasant 75 yo. male in no acute distress. He appears well-developed and well-nourished, obese and well groomed. He is in good spirits today, as usual. Looks well and has lost weight.   HEENT: He has intermittent jaw clenching tics and very few neck flexion tics and rare grunting and rare yelling out tics and some sniffing and eye rolling tics today. He has occasional facial grimacing tics. He is very talkative, rather pleasant and charming! Pupils are equal, round and reactive to light and accommodation. He is status post left cataract repair. Extraocular tracking is good without limitation to gaze excursion or nystagmus noted. Normal smooth pursuit is noted. Hearing is grossly intact. Face is symmetric with normal facial animation and normal facial sensation. Speech is clear with no dysarthria noted. There is no hypophonia. There is no lip, neck/head, jaw or voice tremor. Neck is supple with full range of passive and active motion. There are no carotid bruits on auscultation. Oropharynx exam reveals: mild mouth dryness, adequate dental hygiene and severe airway crowding, due to narrow airway, large uvula, thick soft palate. Mallampati is class III. Tongue protrudes centrally and palate elevates symmetrically.   Chest: Clear to auscultation without wheezing, rhonchi or crackles noted.  Heart: S1+S2+0, regular and normal without murmurs, rubs or gallops noted.   Abdomen: Soft, non-tender and non-distended with normal bowel sounds appreciated on auscultation.  Extremities: There is trace pitting edema in the distal lower extremities bilaterally.  Skin: Warm and dry without trophic changes noted. There are no varicose veins, but has spider  veins.  Musculoskeletal: exam reveals no obvious joint deformities, tenderness or joint swelling or erythema.   Neurologically:   Mental status: The patient is awake, alert and oriented in all 4 spheres. His memory, attention, language and knowledge are appropriate. There is no aphasia, agnosia, apraxia or anomia. Speech is clear with normal prosody and enunciation with intermittent grunting, sniffing, yelling out, throat clearing, coughing tics. Thought process is linear. Mood is congruent and affect is normal.  Cranial nerves are as described above under HEENT exam. In addition, shoulder shrug is normal with equal shoulder height noted. Motor exam: Normal bulk, strength and tone is noted. Tics are described above, no appendicular tics. There is no drift, tremor or rebound. Romberg is negative. Reflexes are 1+ in the UEs and trace in the LEs. Fine motor skills are intact with normal finger taps, normal hand movements, normal rapid alternating patting, normal foot taps and normal foot agility.  Cerebellar testing shows no dysmetria or intention tremor on finger to nose testing. There is no truncal or gait ataxia.  Sensory exam is intact overall with slight reduction in PP and temperature in the distal LEs bilaterally.  Gait, station and balance are unremarkable. No veering to one side is noted. No leaning to one side is noted. Posture is age-appropriate and stance is narrow based. No problems turning are noted. He turns en bloc. Tandem walk is not possible for him, unchanged.               Assessment and Plan:   In summary, [name] is a very pleasant 75 year old male with an underlying history of diabetes and obesity who presents for  followup consultation of his Tourette's syndrome as well as severe OSA with a central component, on BiPAP. His physical exam is c/w phonic and motor tics, stable. He is on BiPAP treatment for his complex sleep apnea at a pressure of 15/11 cwp with good results, as  far as his daytime somnolence and sleep consolidation and nocturia. He has excellent compliance. We reviewed his previous test results briefly again today, and his recent compliance data with him and his wife and his split night sleep study from 6/14. His compliance has improved greatly over time and I congratulated him in that regard. He has had improvement in his tics with Tenex at the current dose. He has had no side effects. He has lost over 30 lb in 6 months, as part of a study at [facility] and feels great!  I encouraged the patient to eat healthy, exercise daily and keep well hydrated, to keep a scheduled bedtime and wake time routine, to not skip any meals and eat healthy snacks in between meals and to have protein with every meal. I stressed the importance of regular exercise. We will continue with Tenex at the current dose. I renewed the prescription. I asked him to continue to be fully compliant with BiPAP therapy. I would like see him back in 6 months from now, sooner if the need arises and encouraged them to call with any interim questions, concerns or problems.  I spent 25 minutes in total face-to-face time with the patient, more than 50% of which was spent in counseling and coordination of care, reviewing test results, reviewing medication and discussing or reviewing the diagnosis of OSA and TS, the prognosis and treatment options.

## 2016-03-29 DIAGNOSIS — E119 Type 2 diabetes mellitus without complications: Secondary | ICD-10-CM | POA: Diagnosis not present

## 2016-03-29 DIAGNOSIS — E782 Mixed hyperlipidemia: Secondary | ICD-10-CM | POA: Diagnosis not present

## 2016-04-03 DIAGNOSIS — E119 Type 2 diabetes mellitus without complications: Secondary | ICD-10-CM | POA: Diagnosis not present

## 2016-04-03 DIAGNOSIS — I10 Essential (primary) hypertension: Secondary | ICD-10-CM | POA: Diagnosis not present

## 2016-04-03 DIAGNOSIS — I2510 Atherosclerotic heart disease of native coronary artery without angina pectoris: Secondary | ICD-10-CM | POA: Diagnosis not present

## 2016-04-03 DIAGNOSIS — E782 Mixed hyperlipidemia: Secondary | ICD-10-CM | POA: Diagnosis not present

## 2016-08-07 DIAGNOSIS — Z23 Encounter for immunization: Secondary | ICD-10-CM | POA: Diagnosis not present

## 2016-08-15 ENCOUNTER — Ambulatory Visit: Payer: Medicare Other | Admitting: Neurology

## 2016-09-06 DIAGNOSIS — E119 Type 2 diabetes mellitus without complications: Secondary | ICD-10-CM | POA: Diagnosis not present

## 2016-09-06 DIAGNOSIS — I10 Essential (primary) hypertension: Secondary | ICD-10-CM | POA: Diagnosis not present

## 2016-09-06 DIAGNOSIS — E782 Mixed hyperlipidemia: Secondary | ICD-10-CM | POA: Diagnosis not present

## 2016-09-06 DIAGNOSIS — G4733 Obstructive sleep apnea (adult) (pediatric): Secondary | ICD-10-CM | POA: Diagnosis not present

## 2016-09-06 DIAGNOSIS — F952 Tourette's disorder: Secondary | ICD-10-CM | POA: Diagnosis not present

## 2016-09-06 DIAGNOSIS — Z125 Encounter for screening for malignant neoplasm of prostate: Secondary | ICD-10-CM | POA: Diagnosis not present

## 2016-09-06 DIAGNOSIS — I2510 Atherosclerotic heart disease of native coronary artery without angina pectoris: Secondary | ICD-10-CM | POA: Diagnosis not present

## 2016-09-06 DIAGNOSIS — Z6829 Body mass index (BMI) 29.0-29.9, adult: Secondary | ICD-10-CM | POA: Diagnosis not present

## 2016-09-10 DIAGNOSIS — H2511 Age-related nuclear cataract, right eye: Secondary | ICD-10-CM | POA: Diagnosis not present

## 2016-09-10 DIAGNOSIS — H5201 Hypermetropia, right eye: Secondary | ICD-10-CM | POA: Diagnosis not present

## 2016-09-10 DIAGNOSIS — Z961 Presence of intraocular lens: Secondary | ICD-10-CM | POA: Diagnosis not present

## 2016-09-10 DIAGNOSIS — H25011 Cortical age-related cataract, right eye: Secondary | ICD-10-CM | POA: Diagnosis not present

## 2016-10-18 ENCOUNTER — Telehealth: Payer: Self-pay

## 2016-10-18 NOTE — Telephone Encounter (Signed)
I called pt to remind him to bring his bipap/chip to his appt on 10/24/16 with Dr. Rexene Alberts. Pt verbalized understanding.

## 2016-10-24 ENCOUNTER — Encounter: Payer: Self-pay | Admitting: Neurology

## 2016-10-24 ENCOUNTER — Ambulatory Visit (INDEPENDENT_AMBULATORY_CARE_PROVIDER_SITE_OTHER): Payer: Medicare Other | Admitting: Neurology

## 2016-10-24 VITALS — BP 115/65 | HR 56 | Resp 20 | Ht 69.0 in | Wt 216.0 lb

## 2016-10-24 DIAGNOSIS — G4731 Primary central sleep apnea: Secondary | ICD-10-CM | POA: Diagnosis not present

## 2016-10-24 DIAGNOSIS — F952 Tourette's disorder: Secondary | ICD-10-CM | POA: Diagnosis not present

## 2016-10-24 DIAGNOSIS — G4733 Obstructive sleep apnea (adult) (pediatric): Secondary | ICD-10-CM

## 2016-10-24 MED ORDER — GUANFACINE HCL 1 MG PO TABS: 2.0000 mg | ORAL_TABLET | Freq: Every day | ORAL | 3 refills | Status: DC

## 2016-10-24 NOTE — Progress Notes (Signed)
Subjective:    Patient ID: [name] "[name]" CHET GREENLEY is a 75 y.o. male.  HPI     Interim history:   [name] is a very pleasant 75 year old right-handed gentleman with an underlying medical history of coronary artery disease (s/p angioplasty in 1996 and stent in 1997), s/p back surgeries( x 2 at [facility]), hyperlipidemia, obesity, and Tourette's syndrome, who presents for followup consultation of his TS and his severe mixed sleep apnea, on treatment with BiPAP at home. He is accompanied by his wife again today. I last saw him on 02/15/2016, at which time he was fully compliant with his BiPAP therapy. He was doing well. He and his wife had enrolled in a study at [place] promoting healthy lifestyle, exercise, weight loss and memory improvement. He had lost a significant amount of weight in the past 6 months and felt much better.  Today, 10/24/2016: I could not review his BiPAP compliance today, as he did not bring the chip. He has been fully compliant and has no compliants.   Today, 10/24/2016: He reports doing well. No issues with meds, metoprolol was reduced, simvastatin was reduced. He has been working with Annice Pih, at [place] and Saturdays, and also goes to gym. Has had some stressors regarding his son's health. Thankfully, his son has been stable and making progress.  Previously:  I saw him on 08/09/15, at which time he was fully compliant with his BiPAP, he felt he was doing well. He had some short-term memory issues according to his wife, mostly mild forgetfulness and misplacing things. His tics sometimes would flare up with shaving or when talking with people. His dentist made him a bite guard because he was also grinding his teeth but he felt his bite guard was too uncomfortable. His weight was stable. He was tolerating the Tenex at the current dose and we mutually agreed to continue with the medication as well as with BiPAP.   I reviewed his BiPAP compliance data from  01/16/2016 through 02/14/2016 which is a total of 30 days during which time he used his machine every night with percent used days greater than 4 hours at 100%, indicating superb compliance with an average usage of 5 hours and 30 minutes, residual AHI 2.7, leak at times high with the 95th percentile at 41.2 L/m on 15/11 cm.   I saw him on 02/01/2015, at which time he reported compliance with BiPAP therapy, but he had issues with mask air leaking. He needed new supplies. His tics were under reasonable control with Tenex 2 mg each night. He was not always drinking enough water. I suggested we continue with the current dose of Tenex and that he continue with BiPAP at the current settings. He was completely compliant with treatment.   I reviewed his BiPAP compliance data from 07/08/2015 through 08/06/2015 which is a total of 30 days during which time he used his BiPAP every night with percent used days greater than 4 hours was 90%, indicating excellent compliance with an average usage of 5 hours and 36 minutes, residual AHI low at 1.7 per hour, leak for the most part acceptable with the 95th percentile at 24.7 L/m on a pressure of 15/11 cm.   I saw him on 07/29/2014, at which time he reported compliance with BiPAP treatment but he had taken a trip to the mountains and was not able to use his machine. He felt that the increase in Tenex in the recent past had improved his tooth grinding  and jaw clenching.     I reviewed his BiPAP compliance data from 12/27/2014 through 01/25/2015 which is a total of 30 days during which time he used his machine every night. Percent used days greater than 4 hours was 97%, indicating excellent compliance. Average usage was 5 hours and 47 minutes, residual AHI low at 1.4 per hour and leak acceptable with the 95th percentile at 20.6 L/m on a pressure of 15/11 cm.   I saw him on 01/27/2014, at which time I suggested he continue with Tenex 2 mg each night and he was encouraged to  continue with BiPAP therapy.   I reviewed his compliance data from 06/27/2014 to 07/26/2014 which is a total of 30 days during which time he used his BiPAP on 25 nights. Percent used days greater than 4 hours was 63%, indicating suboptimal compliance. Average usage of 3 hours and 58 minutes. Residual AHI at 3.7 indicating inadequate pressure setting of 15/11. His leak continued to be quite high many times with the 95th percentile at 76.2 L per minute.   I saw him on 08/05/2013, at which time we talked about his sleep study results and his compliance data. He indicated improvement in his daytime somnolence and sleep consolidation but his compliance was suboptimal at the time. I encouraged him to use BiPAP regularly. He reported improvement of his tics with Tenex. He denied major side effects. I suggested he gradually increase it to 2 mg each night.    I reviewed the patient's BiPAP compliance data from 11/17/2013 to 12/16/2013, which is a total of 30 days, during which time the patient used BiPAP every day. The average usage for all days was 5 hours and 29 minutes. The percent used days greater than 4 hours was 77 %, indicating good compliance. The residual AHI was 3.1 per hour, indicating an adequate treatment pressure of 15/11 cm.   I reviewed his compliance data from 12/28/2013 through 01/26/2014 which is a total of 30 days during which time he used BiPAP every night except for one night. Percent used days greater than 4 hours was 80%, indicating very good compliance, average usage was 5 hours and 7 minutes. His residual AHI is 3.1 which is in the desired range. He is on a pressure of 15/11 cm with spontaneous mode. His leak was high at times.   I first met him on 04/01/2013 for his Hx of TS, at which time he also reported a prior diagnosis of severe OSA, for which he had never been treated. I suggested a trial of Tenex for his Tourette's syndrome. I asked him to come back for sleep study. He had a  split-night sleep study on 04/23/2013: His baseline sleep efficiency was reduced at 62.2% with a latency to sleep of 4 minutes and wake after sleep onset of 17 minutes with severe sleep fragmentation noted. He had a increased percentage of stage I sleep, I reduced percentage of stage II sleep, absence of slow-wave sleep and absence of REM sleep prior to CPAP. His baseline oxygen saturation was 91%, his nadir was 73%. He was noted to have mild to moderate snoring. His AHI was 64 per hour. He was then titrated on CPAP with pressures of 5-12 cm. He had ongoing central apneas and was therefore switched to BiPAP and titrated from 14/10 cm to 15/11 cm. His AHI was reduced to 7.5 events at that pressure. Based on the test results I prescribed BiPAP for the patient at a pressure of 15/11  cm with a large size nasal pillows mask. I reviewed compliance data from 06/15/2013 through 08/02/2013 (49 days), during which time he used it 35 days (71%). Percent used days greater than 4 hours was only 47%. Average usage for all days was 3 hours and 10 minutes an average usage for days use was 4 hours and 26 minutes. His residual AHI was 3.5 per hour, indicating an appropriate BiPAP pressure of 15/11 cm with "easy-breathe" on.   He reported not taking his machine when he goes to the mountains. He felt better with regards to his sleep. He had no major difficulty tolerating BiPAP at this pressure. He had been going to bed late, around 2 AM by habit.   He has had improvement in his tics, which have primarily been jaw clenching and phonic tics with Tenex, which was initially at 1 mg qHS. He had no major side effects.   He started having having phonic tic at age 13. He saw Dr. Cathie Olden at [facility] for CO2 treatments. He had ongoing motor and phonic tics and also had a period of coprolalia over the years and was diagnosed with Tourette's syndrome at age 68 by Dr. Bearl Mulberry, Neurologist at [facility]. 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 His  youngest son has tourettes per patient. The patient currently has jaw clenching and tooth grinding tics, neck jerking tics, and grunting and coughing tics.   He had different motor tics and phonic tics in the past. He was treated in the past with Haldol, but he was not on any other meds for this. He has had some balance problems, and fell recently, with no injury to head or otherwise, no LOC.    His Past Medical History Is Significant For: Past Medical History:  Diagnosis Date  . Diabetes ([place])   . OSA (obstructive sleep apnea) 08/05/2013  . Tourette syndrome   . Tourette's syndrome 04/01/2013    His Past Surgical History Is Significant For: Past Surgical History:  Procedure Laterality Date  . CATARACT EXTRACTION  11/2012  . heart stent  01/16/1996    His Family History Is Significant For: Family History  Problem Relation Age of Onset  . Pancreatic cancer Mother   . Heart disease Father     His Social History Is Significant For: Social History   Social History  . Marital status: Married    Spouse name: Edd Fabian  . Number of children: 3  . Years of education: college   Social History Main Topics  . Smoking status: Former Research scientist (life sciences)  . Smokeless tobacco: Never Used     Comment: Quit over 20 years ago  . Alcohol use No  . Drug use: No  . Sexual activity: Not Asked   Other Topics Concern  . None   Social History Narrative   Patient consumes 2 cups of coffee daily    His Allergies Are:  No Known Allergies:   His Current Medications Are:  Outpatient Encounter Prescriptions as of 10/24/2016  Medication Sig  . aspirin (ECOTRIN LOW STRENGTH) 81 MG EC tablet Take 81 mg by mouth daily. Swallow whole.  . Cetirizine HCl (ZYRTEC ALLERGY PO) Take by mouth.  . guanFACINE (TENEX) 1 MG tablet Take 2 tablets (2 mg total) by mouth at bedtime. 2 pills each bedtime.  . metoprolol succinate (TOPROL-XL) 25 MG 24 hr tablet Take 25 mg by mouth 2 (two) times daily. One am , one pm  . simvastatin  (ZOCOR) 20 MG tablet Take  20 mg by mouth daily.   No facility-administered encounter medications on file as of 10/24/2016.   :  Review of Systems:  Out of a complete 14 point review of systems, all are reviewed and negative with the exception of these symptoms as listed below:  Review of Systems  Neurological:       Pt presents today to discuss his bipap usage. Pt did not bring his bipap nor chip today, even though he was reminded to bring it. Pt says that he uses his bipap every night.    Objective:  Neurologic Exam  Physical Exam Physical Examination:   Vitals:   10/24/16 0935  BP: 115/65  Pulse: (!) 56  Resp: 20    General Examination: The patient is a very pleasant 75 yo. male in no acute distress. He appears well-developed and well-nourished, obese and well groomed. He is in good spirits today, as usual. Looks well.   HEENT: He has intermittent jaw clenching tics and very few neck flexion tics and rare grunting and rare yelling out tics and some sniffing and eye rolling tics today, appear stable. He has occasional facial grimacing tics. He is very talkative, his usual pleasant self. Pupils are equal, round and reactive to light and accommodation. He is status post left cataract repair. Extraocular tracking is good without limitation to gaze excursion or nystagmus noted. Normal smooth pursuit is noted. Hearing is grossly intact. Face is symmetric with normal facial animation and normal facial sensation. Speech is clear with no dysarthria noted. There is no hypophonia. There is no lip, neck/head, jaw or voice tremor. Neck is supple with full range of passive and active motion. There are no carotid bruits on auscultation. Oropharynx exam reveals: mild mouth dryness, adequate dental hygiene and severe airway crowding, due to narrow airway, large uvula, thick soft palate. Mallampati is class III. Tongue protrudes centrally and palate elevates symmetrically.   Chest: Clear to  auscultation without wheezing, rhonchi or crackles noted.  Heart: S1+S2+0, regular and normal without murmurs, rubs or gallops noted.   Abdomen: Soft, non-tender and non-distended with normal bowel sounds appreciated on auscultation.  Extremities: There is trace pitting edema in the distal lower extremities bilaterally.  Skin: Warm and dry without trophic changes noted. There are no varicose veins, but has spider veins.  Musculoskeletal: exam reveals no obvious joint deformities, tenderness or joint swelling or erythema.   Neurologically:   Mental status: The patient is awake, alert and oriented in all 4 spheres. His memory, attention, language and knowledge are appropriate. There is no aphasia, agnosia, apraxia or anomia. Speech is clear with normal prosody and enunciation with intermittent grunting, sniffing, yelling out, throat clearing, coughing tics. Thought process is linear. Mood is congruent and affect is normal.  Cranial nerves are as described above under HEENT exam. In addition, shoulder shrug is normal with equal shoulder height noted. Motor exam: Normal bulk, strength and tone is noted. Tics are described above, no appendicular tics. There is no drift, tremor or rebound. Romberg is negative. Reflexes are 1+ in the UEs and trace in the LEs. Fine motor skills are intact with normal finger taps, normal hand movements, normal rapid alternating patting, normal foot taps and normal foot agility.  Cerebellar testing shows no dysmetria or intention tremor on finger to nose testing. There is no truncal or gait ataxia.  Sensory exam is stable. Slight reduction in PP and temperature in the distal LEs bilaterally.  Gait, station and balance are unremarkable. No veering  to one side is noted. No leaning to one side is noted. Posture is age-appropriate and stance is narrow based. No problems turning are noted. He turns en bloc. Tandem walk is not possible for him, unchanged.                Assessment and Plan:   In summary, RUXIN RANSOME is a very pleasant 75 year old male with an underlying history of diabetes and obesity, who presents for followup consultation of his Tourette's syndrome as well as severe OSA with a central component, stable on BiPAP. His physical exam is c/w phonic and motor tics, stable. He is on BiPAP treatment for his complex sleep apnea at a pressure of 15/11 cwp with good results, as far as his daytime somnolence and sleep consolidation and nocturia and has had excellent compliance. We reviewed his previous test results before. We will try to get updated compliance data. He is commended for this. I congratulated him in that regard. He has had improvement in his tics with Tenex at the current dose. He has had no side effects. He has lost over 30 lb in 6 months, as part of a study at [facility] and feels well. I encouraged the patient to continue with his healthy lifestyle, and we will continue with generic Tenex at the current dose. I renewed the prescription. I asked him to continue to be fully compliant with BiPAP therapy. I would like see him back in 6 months from now, sooner if the need arises and encouraged them to call with any interim questions, concerns or problems. I answered all their questions today and the patient and his wife were in agreement. I spent 25 minutes in total face-to-face time with the patient, more than 50% of which was spent in counseling and coordination of care, reviewing test results, reviewing medication and discussing or reviewing the diagnosis of OSA and TS, the prognosis and treatment options.

## 2016-10-24 NOTE — Patient Instructions (Signed)
Keep up the good work.  I will see you back in 6 months.  Your exam is stable. Keep using your BiPAP faithfully.

## 2017-04-24 ENCOUNTER — Ambulatory Visit: Payer: Medicare Other | Admitting: Neurology

## 2017-04-25 ENCOUNTER — Other Ambulatory Visit: Payer: Self-pay | Admitting: Neurology

## 2017-05-21 ENCOUNTER — Encounter: Payer: Self-pay | Admitting: Neurology

## 2017-05-21 ENCOUNTER — Telehealth: Payer: Self-pay

## 2017-05-21 ENCOUNTER — Other Ambulatory Visit: Payer: Self-pay

## 2017-05-21 NOTE — Telephone Encounter (Signed)
I called pt and reminded him to bring his bipap chip to his appt tomorrow with Dr. Rexene Alberts. I also reminded pt of his appt date and time. Pt verbalized understanding.

## 2017-05-22 ENCOUNTER — Ambulatory Visit (INDEPENDENT_AMBULATORY_CARE_PROVIDER_SITE_OTHER): Payer: Medicare Other | Admitting: Neurology

## 2017-05-22 ENCOUNTER — Encounter: Payer: Self-pay | Admitting: Neurology

## 2017-05-22 VITALS — BP 124/68 | HR 60 | Ht 70.0 in | Wt 221.0 lb

## 2017-05-22 DIAGNOSIS — G4731 Primary central sleep apnea: Secondary | ICD-10-CM

## 2017-05-22 DIAGNOSIS — G4733 Obstructive sleep apnea (adult) (pediatric): Secondary | ICD-10-CM

## 2017-05-22 DIAGNOSIS — F952 Tourette's disorder: Secondary | ICD-10-CM | POA: Diagnosis not present

## 2017-05-22 NOTE — Patient Instructions (Signed)
We will try to get supplies and if possible a new BiPAP machine through [facility].  We will continue your Tenex at 2 mg each.
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
## 2017-07-15 DIAGNOSIS — E119 Type 2 diabetes mellitus without complications: Secondary | ICD-10-CM | POA: Diagnosis not present

## 2017-07-15 DIAGNOSIS — I10 Essential (primary) hypertension: Secondary | ICD-10-CM | POA: Diagnosis not present

## 2017-07-17 DIAGNOSIS — Z6829 Body mass index (BMI) 29.0-29.9, adult: Secondary | ICD-10-CM | POA: Diagnosis not present

## 2017-07-17 DIAGNOSIS — E782 Mixed hyperlipidemia: Secondary | ICD-10-CM | POA: Diagnosis not present

## 2017-07-17 DIAGNOSIS — I10 Essential (primary) hypertension: Secondary | ICD-10-CM | POA: Diagnosis not present

## 2017-07-17 DIAGNOSIS — F952 Tourette's disorder: Secondary | ICD-10-CM | POA: Diagnosis not present

## 2017-07-17 DIAGNOSIS — Z23 Encounter for immunization: Secondary | ICD-10-CM | POA: Diagnosis not present

## 2017-07-17 DIAGNOSIS — I2510 Atherosclerotic heart disease of native coronary artery without angina pectoris: Secondary | ICD-10-CM | POA: Diagnosis not present

## 2017-07-17 DIAGNOSIS — G4733 Obstructive sleep apnea (adult) (pediatric): Secondary | ICD-10-CM | POA: Diagnosis not present

## 2017-07-17 DIAGNOSIS — E119 Type 2 diabetes mellitus without complications: Secondary | ICD-10-CM | POA: Diagnosis not present

## 2017-09-12 DIAGNOSIS — E119 Type 2 diabetes mellitus without complications: Secondary | ICD-10-CM | POA: Diagnosis not present

## 2017-09-12 DIAGNOSIS — H2511 Age-related nuclear cataract, right eye: Secondary | ICD-10-CM | POA: Diagnosis not present

## 2017-09-12 DIAGNOSIS — H25011 Cortical age-related cataract, right eye: Secondary | ICD-10-CM | POA: Diagnosis not present

## 2017-09-12 DIAGNOSIS — H5201 Hypermetropia, right eye: Secondary | ICD-10-CM | POA: Diagnosis not present

## 2017-11-06 DIAGNOSIS — R69 Illness, unspecified: Secondary | ICD-10-CM | POA: Diagnosis not present

## 2017-11-15 DIAGNOSIS — I10 Essential (primary) hypertension: Secondary | ICD-10-CM | POA: Diagnosis not present

## 2017-11-15 DIAGNOSIS — E119 Type 2 diabetes mellitus without complications: Secondary | ICD-10-CM | POA: Diagnosis not present

## 2017-11-15 DIAGNOSIS — E782 Mixed hyperlipidemia: Secondary | ICD-10-CM | POA: Diagnosis not present

## 2017-11-20 DIAGNOSIS — I10 Essential (primary) hypertension: Secondary | ICD-10-CM | POA: Diagnosis not present

## 2017-11-20 DIAGNOSIS — I2510 Atherosclerotic heart disease of native coronary artery without angina pectoris: Secondary | ICD-10-CM | POA: Diagnosis not present

## 2017-11-20 DIAGNOSIS — R69 Illness, unspecified: Secondary | ICD-10-CM | POA: Diagnosis not present

## 2017-11-20 DIAGNOSIS — E782 Mixed hyperlipidemia: Secondary | ICD-10-CM | POA: Diagnosis not present

## 2017-11-20 DIAGNOSIS — Z6823 Body mass index (BMI) 23.0-23.9, adult: Secondary | ICD-10-CM | POA: Diagnosis not present

## 2017-11-20 DIAGNOSIS — G4733 Obstructive sleep apnea (adult) (pediatric): Secondary | ICD-10-CM | POA: Diagnosis not present

## 2017-11-20 DIAGNOSIS — E119 Type 2 diabetes mellitus without complications: Secondary | ICD-10-CM | POA: Diagnosis not present

## 2017-11-25 ENCOUNTER — Encounter: Payer: Self-pay | Admitting: Neurology

## 2017-11-25 ENCOUNTER — Ambulatory Visit: Payer: Medicare Other | Admitting: Neurology

## 2017-11-25 VITALS — BP 113/73 | HR 62 | Ht 70.0 in | Wt 231.0 lb

## 2017-11-25 DIAGNOSIS — G4733 Obstructive sleep apnea (adult) (pediatric): Secondary | ICD-10-CM

## 2017-11-25 DIAGNOSIS — F952 Tourette's disorder: Secondary | ICD-10-CM | POA: Diagnosis not present

## 2017-11-25 DIAGNOSIS — R69 Illness, unspecified: Secondary | ICD-10-CM | POA: Diagnosis not present

## 2017-11-25 MED ORDER — GUANFACINE HCL 1 MG PO TABS: 2.0000 mg | ORAL_TABLET | Freq: Every day | ORAL | 3 refills | Status: DC

## 2017-11-25 NOTE — Progress Notes (Signed)
Subjective:    Patient ID: [name] "[name]" L Jameison Haji. is a 77 y.o. male.  HPI     Interim history:   Mr. Orrego is a very pleasant 77 year old right-handed gentleman with an underlying medical history of coronary artery disease (s/p angioplasty in 1996 and stent in 1997), s/p back surgeries( x 2 at [facility]), hyperlipidemia, obesity, and Tourette's syndrome, who presents for followup consultation of his TS and his severe mixed sleep apnea, on treatment with BiPAP at home. He is accompanied by his wife again today. I last saw him on 05/22/2017, at which time he was compliant with his BiPAP. He was not happy with this DME company. I suggested we try to get some new BiPAP machine. I suggested we could try to switch to another DME company. For his tic disorder he was advised to continue with Tenex 2 mg each night.  Today, 11/25/2017: I reviewed his BiPAP compliance data from 10/26/2017 through 11/24/2017, which is a total of 30 days, during which time he used his BiPAP 29 days with percent used days greater than 4 hours at 90%, indicating excellent compliance with an average usage of 6 hours and 33 minutes, residual AHI 4.9 per hour, leak consistently high with the 95th percentile at 94.1 L/m on a pressure of 15/11 cm. He reports that he would like to get a new BiPAP machine if possible. He is advised that he may not be eligible until July of this year as it will be 5 years on treatment with his machine. He reports doing well, had blood work with PCP recently, needs a new headgear. Has been using a FFM.   The patient's allergies, current medications, family history, past medical history, past social history, past surgical history and problem list were reviewed and updated as appropriate.   Previously (copied from previous notes for reference):   I saw him on 10/24/2016, at which time he reported full compliance with his BiPAP but I could not review of compliance data at the time. Overall, he reported  doing well, he was working with a Physiological scientist. He was also going to the gym in between. He did have some interim stressors what with his son's health. His Tourette's symptoms were stable on his medication regimen and we mutually agreed to continue with his medications at the current doses.     I saw him on 02/15/2016, at which time he was fully compliant with his BiPAP therapy. He was doing well. He and his wife had enrolled in a study at [place] promoting healthy lifestyle, exercise, weight loss and memory improvement. He had lost a significant amount of weight in the past 6 months and felt much better.   I saw him on 08/09/15, at which time he was fully compliant with his BiPAP, he felt he was doing well. He had some short-term memory issues according to his wife, mostly mild forgetfulness and misplacing things. His tics sometimes would flare up with shaving or when talking with people. His dentist made him a bite guard because he was also grinding his teeth but he felt his bite guard was too uncomfortable. His weight was stable. He was tolerating the Tenex at the current dose and we mutually agreed to continue with the medication as well as with BiPAP.   I reviewed his BiPAP compliance data from 01/16/2016 through 02/14/2016 which is a total of 30 days during which time he used his machine every night with percent used days greater than 4 hours  at 100%, indicating superb compliance with an average usage of 5 hours and 30 minutes, residual AHI 2.7, leak at times high with the 95th percentile at 41.2 L/m on 15/11 cm.   I saw him on 02/01/2015, at which time he reported compliance with BiPAP therapy, but he had issues with mask air leaking. He needed new supplies. His tics were under reasonable control with Tenex 2 mg each night. He was not always drinking enough water. I suggested we continue with the current dose of Tenex and that he continue with BiPAP at the current settings. He was completely  compliant with treatment.   I reviewed his BiPAP compliance data from 07/08/2015 through 08/06/2015 which is a total of 30 days during which time he used his BiPAP every night with percent used days greater than 4 hours was 90%, indicating excellent compliance with an average usage of 5 hours and 36 minutes, residual AHI low at 1.7 per hour, leak for the most part acceptable with the 95th percentile at 24.7 L/m on a pressure of 15/11 cm.   I saw him on 07/29/2014, at which time he reported compliance with BiPAP treatment but he had taken a trip to the mountains and was not able to use his machine. He felt that the increase in Tenex in the recent past had improved his tooth grinding and jaw clenching.     I reviewed his BiPAP compliance data from 12/27/2014 through 01/25/2015 which is a total of 30 days during which time he used his machine every night. Percent used days greater than 4 hours was 97%, indicating excellent compliance. Average usage was 5 hours and 47 minutes, residual AHI low at 1.4 per hour and leak acceptable with the 95th percentile at 20.6 L/m on a pressure of 15/11 cm.   I saw him on 01/27/2014, at which time I suggested he continue with Tenex 2 mg each night and he was encouraged to continue with BiPAP therapy.   I reviewed his compliance data from 06/27/2014 to 07/26/2014 which is a total of 30 days during which time he used his BiPAP on 25 nights. Percent used days greater than 4 hours was 63%, indicating suboptimal compliance. Average usage of 3 hours and 58 minutes. Residual AHI at 3.7 indicating inadequate pressure setting of 15/11. His leak continued to be quite high many times with the 95th percentile at 76.2 L per minute.   I saw him on 08/05/2013, at which time we talked about his sleep study results and his compliance data. He indicated improvement in his daytime somnolence and sleep consolidation but his compliance was suboptimal at the time. I encouraged him to use BiPAP  regularly. He reported improvement of his tics with Tenex. He denied major side effects. I suggested he gradually increase it to 2 mg each night.    I reviewed the patient's BiPAP compliance data from 11/17/2013 to 12/16/2013, which is a total of 30 days, during which time the patient used BiPAP every day. The average usage for all days was 5 hours and 29 minutes. The percent used days greater than 4 hours was 77 %, indicating good compliance. The residual AHI was 3.1 per hour, indicating an adequate treatment pressure of 15/11 cm.     I reviewed his compliance data from 12/28/2013 through 01/26/2014 which is a total of 30 days during which time he used BiPAP every night except for one night. Percent used days greater than 4 hours was 80%, indicating very good compliance,  average usage was 5 hours and 7 minutes. His residual AHI is 3.1 which is in the desired range. He is on a pressure of 15/11 cm with spontaneous mode. His leak was high at times.     I first met him on 04/01/2013 for his Hx of TS, at which time he also reported a prior diagnosis of severe OSA, for which he had never been treated. I suggested a trial of Tenex for his Tourette's syndrome. I asked him to come back for sleep study. He had a split-night sleep study on 04/23/2013: His baseline sleep efficiency was reduced at 62.2% with a latency to sleep of 4 minutes and wake after sleep onset of 17 minutes with severe sleep fragmentation noted. He had a increased percentage of stage I sleep, I reduced percentage of stage II sleep, absence of slow-wave sleep and absence of REM sleep prior to CPAP. His baseline oxygen saturation was 91%, his nadir was 73%. He was noted to have mild to moderate snoring. His AHI was 64 per hour. He was then titrated on CPAP with pressures of 5-12 cm. He had ongoing central apneas and was therefore switched to BiPAP and titrated from 14/10 cm to 15/11 cm. His AHI was reduced to 7.5 events at that pressure. Based on the  test results I prescribed BiPAP for the patient at a pressure of 15/11 cm with a large size nasal pillows mask. I reviewed compliance data from 06/15/2013 through 08/02/2013 (49 days), during which time he used it 35 days (71%). Percent used days greater than 4 hours was only 47%. Average usage for all days was 3 hours and 10 minutes an average usage for days use was 4 hours and 26 minutes. His residual AHI was 3.5 per hour, indicating an appropriate BiPAP pressure of 15/11 cm with "easy-breathe" on.   He reported not taking his machine when he goes to the mountains. He felt better with regards to his sleep. He had no major difficulty tolerating BiPAP at this pressure. He had been going to bed late, around 2 AM by habit.   He has had improvement in his tics, which have primarily been jaw clenching and phonic tics with Tenex, which was initially at 1 mg qHS. He had no major side effects.   He started having having phonic tic at age 8. He saw Dr. Cathie Olden at [facility] for CO2 treatments. He had ongoing motor and phonic tics and also had a period of coprolalia over the years and was diagnosed with Tourette's syndrome at age 72 by Dr. Bearl Mulberry, Neurologist at [facility]. 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 His youngest son has tourettes per patient. The patient currently has jaw clenching and tooth grinding tics, neck jerking tics, and grunting and coughing tics.   He had different motor tics and phonic tics in the past. He was treated in the past with Haldol, but he was not on any other meds for this. He has had some balance problems, and fell recently, with no injury to head or otherwise, no LOC.   His Past Medical History Is Significant For: Past Medical History:  Diagnosis Date  . Diabetes ([place])   . OSA (obstructive sleep apnea) 08/05/2013  . Tourette syndrome   . Tourette's syndrome 04/01/2013    His Past Surgical History Is Significant For: Past Surgical History:  Procedure Laterality Date  .  CATARACT EXTRACTION  11/2012  . heart stent  01/16/1996    His Family History  Is Significant For: Family History  Problem Relation Age of Onset  . Pancreatic cancer Mother   . Heart disease Father     His Social History Is Significant For: Social History   Socioeconomic History  . Marital status: Married    Spouse name: Edd Fabian  . Number of children: 3  . Years of education: college  . Highest education level: None  Social Needs  . Financial resource strain: None  . Food insecurity - worry: None  . Food insecurity - inability: None  . Transportation needs - medical: None  . Transportation needs - non-medical: None  Occupational History  . None  Tobacco Use  . Smoking status: Former Research scientist (life sciences)  . Smokeless tobacco: Never Used  . Tobacco comment: Quit over 20 years ago  Substance and Sexual Activity  . Alcohol use: No    Alcohol/week: 0.0 oz  . Drug use: No  . Sexual activity: None  Other Topics Concern  . None  Social History Narrative   Patient consumes 2 cups of coffee daily    His Allergies Are:  No Known Allergies:   His Current Medications Are:  Outpatient Encounter Medications as of 11/25/2017  Medication Sig  . aspirin (ECOTRIN LOW STRENGTH) 81 MG EC tablet Take 81 mg by mouth daily. Swallow whole.  . Cetirizine HCl (ZYRTEC ALLERGY PO) Take by mouth.  . guanFACINE (TENEX) 1 MG tablet Take 2 tablets (2 mg total) by mouth at bedtime. 2 pills each bedtime.  . metoprolol succinate (TOPROL-XL) 25 MG 24 hr tablet Take 25 mg by mouth 2 (two) times daily. One am , one pm  . simvastatin (ZOCOR) 20 MG tablet Take 20 mg by mouth daily.   No facility-administered encounter medications on file as of 11/25/2017.   :  Review of Systems:  Out of a complete 14 point review of systems, all are reviewed and negative with the exception of these symptoms as listed below: Review of Systems  Neurological:       Pt presents today to follow up on his cpap. Pt wants a new cpap.     Objective:  Neurological Exam  Physical Exam Physical Examination:   Vitals:   11/25/17 1006  BP: 113/73  Pulse: 62    General Examination: The patient is a very pleasant 77 y.o. male in no acute distress. He appears well-developed and well-nourished and well groomed. Good spirits.   HEENT:He has intermittent jaw clenching tics and very few neck flexion tics and rare grunting and rare yelling out tics and some sniffing and eye rolling tics today, overall stable, tics flare up when he gets excited. Overall very little tics. He has occasional facial grimacing tics. Pupils are equal, round and reactive to light and accommodation. He is status post left cataract repair. Extraocular tracking is good without limitation to gaze excursion or nystagmus noted. Normal smooth pursuit is noted. Hearing is grossly intact. Face is symmetric with normal facial animation and normal facial sensation. Speech is clear with no dysarthria noted. There is no hypophonia. There is no lip, neck/head, jaw or voice tremor. Neck FROM. Oropharynx exam reveals: mild mouth dryness, adequate dental hygiene and severe airway crowding, due to narrow airway, large uvula, thick soft palate. Mallampati is class III. Tongue protrudes centrally and palate elevates symmetrically.   Chest:Clear to auscultation without wheezing, rhonchi or crackles noted.  Heart:S1+S2+0, regular and normal without murmurs, rubs or gallops noted.   Abdomen:Soft, non-tender and non-distended with normal bowel sounds appreciated on  auscultation.  Extremities:There is trace pitting edema in the distal lower extremities bilaterally.  Skin: Warm and dry without trophic changes noted. There are no varicose veins, but has spider veins.  Musculoskeletal: exam reveals no obvious joint deformities, tenderness or joint swelling or erythema.   Neurologically:   Mental status: The patient is awake, alert and oriented in all 4 spheres. His  memory, attention, language and knowledge are appropriate. There is no aphasia, agnosia, apraxia or anomia. Speech is clear with normal prosody and enunciation with intermittent grunting, sniffing, yelling out, throat clearing, coughing tics. Thought process is linear. Mood is congruent and affect is normal.  Cranial nerves are as described above under HEENT exam. In addition, shoulder shrug is normal with equal shoulder height noted. Motor exam: Normal bulk, strength and tone is noted. Tics are described above. There is no tremor. Fine motor skills are grossly intact.  Cerebellar testing shows no dysmetria or intention tremor. There is no truncal or gait ataxia.  Sensory exam is stable.  Gait, station and balance are unremarkable. No veering to one side is noted. No leaning to one side is noted. Posture is age-appropriate and stance is narrow based. No problems turning are noted. Tandem walk is not tried for safety.   Assessment and Plan:   In summary, RANALD ALESSIO is a very pleasant 77 year old male with an underlying history of diabetes and obesity, who presents for followup consultation of his Tourette's syndrome as well as severe OSA with a central component, stableon BiPAP.  I renewed his prescription for Tenex 2 mg at night, he had blood work recently through his PCP. He is encouraged to continue with BiPAP and commended for his treatment adherence. I also wrote a new CPAP related supplies. He may be eligible for a new BiPAP machine by the summer. I suggested a 6 month follow-up, sooner as needed. His physical exam is stable, tics are infrequent today. He has been working with a Physiological scientist, has lost weight, is trying to maintain a healthy lifestyle. He is commended for this. I answered all her questions today and the patient and his wife were in agreement.  I spent 25 minutes in total face-to-face time with the patient, more than 50% of which was spent in counseling and  coordination of care, reviewing test results, reviewing medication and discussing or reviewing the diagnosis of OSA, TS, the prognosis and treatment options. Pertinent laboratory and imaging test results that were available during this visit with the patient were reviewed by me and considered in my medical decision making (see chart for details).

## 2017-11-25 NOTE — Patient Instructions (Addendum)
We won't need to do labs, as you recently had labs with your primary care.  We will keep the Tenex at 2 mg at night.  Keep up the good work with your BiPAP.  I will order new supplies through [employer].  In July, we may be able to get you a new BiPAP machine.

## 2017-12-09 ENCOUNTER — Telehealth: Payer: Self-pay | Admitting: Neurology

## 2017-12-09 NOTE — Telephone Encounter (Signed)
I called pt. He reports that [facility] told him that he should be taking tenex 1 tablet at bedtime and only gave him 30 tablets. I explained that Dr. Rexene Alberts wrote for 2 tablets qhs and to dispense 180 tablets. Pt asked me to call [facility].  [facility]. They report they did get this RX on 11/25/17 and will correct it and call the pt.

## 2017-12-09 NOTE — Telephone Encounter (Signed)
Pt is asking for a call back re: there being a mix up on his guanFACINE (TENEX) 1 MG tablet and what he generally gets versus this last refill being just 1 tablet and a 30 day supply.  Pt states he usually gets 2 pills at night and a 90 day supply.  Pt welcomes a call back on getting this corrected for him.

## 2018-02-06 DIAGNOSIS — R69 Illness, unspecified: Secondary | ICD-10-CM | POA: Diagnosis not present

## 2018-02-10 DIAGNOSIS — E119 Type 2 diabetes mellitus without complications: Secondary | ICD-10-CM | POA: Diagnosis not present

## 2018-02-10 DIAGNOSIS — I10 Essential (primary) hypertension: Secondary | ICD-10-CM | POA: Diagnosis not present

## 2018-02-10 DIAGNOSIS — R69 Illness, unspecified: Secondary | ICD-10-CM | POA: Diagnosis not present

## 2018-02-10 DIAGNOSIS — I2510 Atherosclerotic heart disease of native coronary artery without angina pectoris: Secondary | ICD-10-CM | POA: Diagnosis not present

## 2018-02-10 DIAGNOSIS — I519 Heart disease, unspecified: Secondary | ICD-10-CM | POA: Diagnosis not present

## 2018-02-10 DIAGNOSIS — Z6823 Body mass index (BMI) 23.0-23.9, adult: Secondary | ICD-10-CM | POA: Diagnosis not present

## 2018-02-10 DIAGNOSIS — E782 Mixed hyperlipidemia: Secondary | ICD-10-CM | POA: Diagnosis not present

## 2018-02-10 DIAGNOSIS — G4733 Obstructive sleep apnea (adult) (pediatric): Secondary | ICD-10-CM | POA: Diagnosis not present

## 2018-02-12 DIAGNOSIS — E119 Type 2 diabetes mellitus without complications: Secondary | ICD-10-CM | POA: Diagnosis not present

## 2018-02-12 DIAGNOSIS — I10 Essential (primary) hypertension: Secondary | ICD-10-CM | POA: Diagnosis not present

## 2018-02-12 DIAGNOSIS — E782 Mixed hyperlipidemia: Secondary | ICD-10-CM | POA: Diagnosis not present

## 2018-02-12 DIAGNOSIS — I2510 Atherosclerotic heart disease of native coronary artery without angina pectoris: Secondary | ICD-10-CM | POA: Diagnosis not present

## 2018-02-12 DIAGNOSIS — Z6832 Body mass index (BMI) 32.0-32.9, adult: Secondary | ICD-10-CM | POA: Diagnosis not present

## 2018-02-12 DIAGNOSIS — R69 Illness, unspecified: Secondary | ICD-10-CM | POA: Diagnosis not present

## 2018-02-12 DIAGNOSIS — G4733 Obstructive sleep apnea (adult) (pediatric): Secondary | ICD-10-CM | POA: Diagnosis not present

## 2018-03-10 ENCOUNTER — Telehealth: Payer: Self-pay

## 2018-03-10 NOTE — Telephone Encounter (Signed)
Received notice from [employer] that pt's guafacine is covered from 10/27/2017-10/28/2018. Ref #: [id number]

## 2018-03-10 NOTE — Telephone Encounter (Signed)
Received a PA request for [facility]ine from [facility] [employer] [address]. PA sent to [facility]. Key: [id number]

## 2018-03-18 DIAGNOSIS — Z6834 Body mass index (BMI) 34.0-34.9, adult: Secondary | ICD-10-CM | POA: Diagnosis not present

## 2018-03-18 DIAGNOSIS — I10 Essential (primary) hypertension: Secondary | ICD-10-CM | POA: Diagnosis not present

## 2018-03-18 DIAGNOSIS — Z7982 Long term (current) use of aspirin: Secondary | ICD-10-CM | POA: Diagnosis not present

## 2018-03-18 DIAGNOSIS — I2510 Atherosclerotic heart disease of native coronary artery without angina pectoris: Secondary | ICD-10-CM | POA: Diagnosis not present

## 2018-03-18 DIAGNOSIS — E785 Hyperlipidemia, unspecified: Secondary | ICD-10-CM | POA: Diagnosis not present

## 2018-03-18 DIAGNOSIS — R69 Illness, unspecified: Secondary | ICD-10-CM | POA: Diagnosis not present

## 2018-03-18 DIAGNOSIS — G4730 Sleep apnea, unspecified: Secondary | ICD-10-CM | POA: Diagnosis not present

## 2018-03-18 DIAGNOSIS — E669 Obesity, unspecified: Secondary | ICD-10-CM | POA: Diagnosis not present

## 2018-03-18 DIAGNOSIS — Z87891 Personal history of nicotine dependence: Secondary | ICD-10-CM | POA: Diagnosis not present

## 2018-05-19 DIAGNOSIS — G4733 Obstructive sleep apnea (adult) (pediatric): Secondary | ICD-10-CM | POA: Diagnosis not present

## 2018-05-20 DIAGNOSIS — I10 Essential (primary) hypertension: Secondary | ICD-10-CM | POA: Diagnosis not present

## 2018-05-20 DIAGNOSIS — E782 Mixed hyperlipidemia: Secondary | ICD-10-CM | POA: Diagnosis not present

## 2018-05-20 DIAGNOSIS — E119 Type 2 diabetes mellitus without complications: Secondary | ICD-10-CM | POA: Diagnosis not present

## 2018-05-22 ENCOUNTER — Telehealth: Payer: Self-pay

## 2018-05-22 DIAGNOSIS — E782 Mixed hyperlipidemia: Secondary | ICD-10-CM | POA: Diagnosis not present

## 2018-05-22 DIAGNOSIS — E1165 Type 2 diabetes mellitus with hyperglycemia: Secondary | ICD-10-CM | POA: Diagnosis not present

## 2018-05-22 DIAGNOSIS — I10 Essential (primary) hypertension: Secondary | ICD-10-CM | POA: Diagnosis not present

## 2018-05-22 DIAGNOSIS — I2510 Atherosclerotic heart disease of native coronary artery without angina pectoris: Secondary | ICD-10-CM | POA: Diagnosis not present

## 2018-05-22 DIAGNOSIS — Z712 Person consulting for explanation of examination or test findings: Secondary | ICD-10-CM | POA: Diagnosis not present

## 2018-05-22 DIAGNOSIS — R69 Illness, unspecified: Secondary | ICD-10-CM | POA: Diagnosis not present

## 2018-05-22 DIAGNOSIS — E6609 Other obesity due to excess calories: Secondary | ICD-10-CM | POA: Diagnosis not present

## 2018-05-22 DIAGNOSIS — Z6831 Body mass index (BMI) 31.0-31.9, adult: Secondary | ICD-10-CM | POA: Diagnosis not present

## 2018-05-22 DIAGNOSIS — G4733 Obstructive sleep apnea (adult) (pediatric): Secondary | ICD-10-CM | POA: Diagnosis not present

## 2018-05-22 NOTE — Telephone Encounter (Signed)
I called pt, spoke to pt's wife, asked her to remind pt to bring his bipap to his appt with Dr. Rexene Alberts on 05/16/18. Pt's wife verbalized understanding.

## 2018-05-26 ENCOUNTER — Encounter: Payer: Self-pay | Admitting: Neurology

## 2018-05-26 ENCOUNTER — Ambulatory Visit: Payer: Medicare HMO | Admitting: Neurology

## 2018-05-26 VITALS — BP 131/71 | HR 63 | Ht 70.0 in | Wt 226.0 lb

## 2018-05-26 DIAGNOSIS — G4733 Obstructive sleep apnea (adult) (pediatric): Secondary | ICD-10-CM

## 2018-05-26 NOTE — Patient Instructions (Addendum)
I will prescribe a new BiPAP machine. We will keep your medication, Tenex generic 2 mg at bedtime.  Your exam is stable, I am pleased to see. We will see you back in 6 months.

## 2018-05-26 NOTE — Progress Notes (Signed)
Subjective:    Patient ID: [name]. is a 77 y.o. male.  HPI     Interim history:   [name] is a very pleasant 77 year old right-handed gentleman with an underlying medical history of coronary artery disease (s/p angioplasty in 1996 and stent in 1997), s/p back surgeries( x 2 at [facility]), hyperlipidemia, obesity, and Tourette's syndrome, who presents for followup consultation of his TS and his severe mixed sleep apnea, on treatment with BiPAP at home. He is accompanied by his wife again today. I last saw him on 11/25/2017, at which time he requested a new BiPAP machine. He was doing well with his medication regimen.  Today, 05/26/2018: He has not been able to use his BiPAP due to lack of supplies. He would likely be eligible for a new BiPAP machine, continues to be compliant for the most part. Compliant with generic tenex.    The patient's allergies, current medications, family history, past medical history, past social history, past surgical history and problem list were reviewed and updated as appropriate.    Previously (copied from previous notes for reference):    I saw him on 05/22/2017, at which time he was compliant with his BiPAP. He was not happy with this DME company. I suggested we try to get some new BiPAP machine. I suggested we could try to switch to another DME company. For his tic disorder he was advised to continue with Tenex 2 mg each night.   I reviewed his BiPAP compliance data from 10/26/2017 through 11/24/2017, which is a total of 30 days, during which time he used his BiPAP 29 days with percent used days greater than 4 hours at 90%, indicating excellent compliance with an average usage of 6 hours and 33 minutes, residual AHI 4.9 per hour, leak consistently high with the 95th percentile at 94.1 L/m on a pressure of 15/11 cm.    I saw him on 10/24/2016, at which time he reported full compliance with his BiPAP but I could not review of compliance data at the time.  Overall, he reported doing well, he was working with a Physiological scientist. He was also going to the gym in between. He did have some interim stressors what with his son's health. His Tourette's symptoms were stable on his medication regimen and we mutually agreed to continue with his medications at the current doses.    I saw him on 02/15/2016, at which time he was fully compliant with his BiPAP therapy. He was doing well. He and his wife had enrolled in a study at [facility] promoting healthy lifestyle, exercise, weight loss and memory improvement. He had lost a significant amount of weight in the past 6 months and felt much better.   I saw him on 08/09/15, at which time he was fully compliant with his BiPAP, he felt he was doing well. He had some short-term memory issues according to his wife, mostly mild forgetfulness and misplacing things. His tics sometimes would flare up with shaving or when talking with people. His dentist made him a bite guard because he was also grinding his teeth but he felt his bite guard was too uncomfortable. His weight was stable. He was tolerating the Tenex at the current dose and we mutually agreed to continue with the medication as well as with BiPAP.   I reviewed his BiPAP compliance data from 01/16/2016 through 02/14/2016 which is a total of 30 days during which time he used his machine every night with percent  used days greater than 4 hours at 100%, indicating superb compliance with an average usage of 5 hours and 30 minutes, residual AHI 2.7, leak at times high with the 95th percentile at 41.2 L/m on 15/11 cm.   I saw him on 02/01/2015, at which time he reported compliance with BiPAP therapy, but he had issues with mask air leaking. He needed new supplies. His tics were under reasonable control with Tenex 2 mg each night. He was not always drinking enough water. I suggested we continue with the current dose of Tenex and that he continue with BiPAP at the current settings. He was  completely compliant with treatment.   I reviewed his BiPAP compliance data from 07/08/2015 through 08/06/2015 which is a total of 30 days during which time he used his BiPAP every night with percent used days greater than 4 hours was 90%, indicating excellent compliance with an average usage of 5 hours and 36 minutes, residual AHI low at 1.7 per hour, leak for the most part acceptable with the 95th percentile at 24.7 L/m on a pressure of 15/11 cm.   I saw him on 07/29/2014, at which time he reported compliance with BiPAP treatment but he had taken a trip to the mountains and was not able to use his machine. He felt that the increase in Tenex in the recent past had improved his tooth grinding and jaw clenching.     I reviewed his BiPAP compliance data from 12/27/2014 through 01/25/2015 which is a total of 30 days during which time he used his machine every night. Percent used days greater than 4 hours was 97%, indicating excellent compliance. Average usage was 5 hours and 47 minutes, residual AHI low at 1.4 per hour and leak acceptable with the 95th percentile at 20.6 L/m on a pressure of 15/11 cm.   I saw him on 01/27/2014, at which time I suggested he continue with Tenex 2 mg each night and he was encouraged to continue with BiPAP therapy.   I reviewed his compliance data from 06/27/2014 to 07/26/2014 which is a total of 30 days during which time he used his BiPAP on 25 nights. Percent used days greater than 4 hours was 63%, indicating suboptimal compliance. Average usage of 3 hours and 58 minutes. Residual AHI at 3.7 indicating inadequate pressure setting of 15/11. His leak continued to be quite high many times with the 95th percentile at 76.2 L per minute.   I saw him on 08/05/2013, at which time we talked about his sleep study results and his compliance data. He indicated improvement in his daytime somnolence and sleep consolidation but his compliance was suboptimal at the time. I encouraged him to  use BiPAP regularly. He reported improvement of his tics with Tenex. He denied major side effects. I suggested he gradually increase it to 2 mg each night.    I reviewed the patient's BiPAP compliance data from 11/17/2013 to 12/16/2013, which is a total of 30 days, during which time the patient used BiPAP every day. The average usage for all days was 5 hours and 29 minutes. The percent used days greater than 4 hours was 77 %, indicating good compliance. The residual AHI was 3.1 per hour, indicating an adequate treatment pressure of 15/11 cm.     I reviewed his compliance data from 12/28/2013 through 01/26/2014 which is a total of 30 days during which time he used BiPAP every night except for one night. Percent used days greater than 4 hours  was 80%, indicating very good compliance, average usage was 5 hours and 7 minutes. His residual AHI is 3.1 which is in the desired range. He is on a pressure of 15/11 cm with spontaneous mode. His leak was high at times.     I first met him on 04/01/2013 for his Hx of TS, at which time he also reported a prior diagnosis of severe OSA, for which he had never been treated. I suggested a trial of Tenex for his Tourette's syndrome. I asked him to come back for sleep study. He had a split-night sleep study on 04/23/2013: His baseline sleep efficiency was reduced at 62.2% with a latency to sleep of 4 minutes and wake after sleep onset of 17 minutes with severe sleep fragmentation noted. He had a increased percentage of stage I sleep, I reduced percentage of stage II sleep, absence of slow-wave sleep and absence of REM sleep prior to CPAP. His baseline oxygen saturation was 91%, his nadir was 73%. He was noted to have mild to moderate snoring. His AHI was 64 per hour. He was then titrated on CPAP with pressures of 5-12 cm. He had ongoing central apneas and was therefore switched to BiPAP and titrated from 14/10 cm to 15/11 cm. His AHI was reduced to 7.5 events at that pressure.  Based on the test results I prescribed BiPAP for the patient at a pressure of 15/11 cm with a large size nasal pillows mask. I reviewed compliance data from 06/15/2013 through 08/02/2013 (49 days), during which time he used it 35 days (71%). Percent used days greater than 4 hours was only 47%. Average usage for all days was 3 hours and 10 minutes an average usage for days use was 4 hours and 26 minutes. His residual AHI was 3.5 per hour, indicating an appropriate BiPAP pressure of 15/11 cm with "easy-breathe" on.   He reported not taking his machine when he goes to the mountains. He felt better with regards to his sleep. He had no major difficulty tolerating BiPAP at this pressure. He had been going to bed late, around 2 AM by habit.   He has had improvement in his tics, which have primarily been jaw clenching and phonic tics with Tenex, which was initially at 1 mg qHS. He had no major side effects.   He started having having phonic tic at age 16. He saw Dr. Cathie Olden at [facility] for CO2 treatments. He had ongoing motor and phonic tics and also had a period of coprolalia over the years and was diagnosed with Tourette's syndrome at age 76 by Dr. Bearl Mulberry, Neurologist at [facility]. 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 His youngest son has tourettes per patient. The patient currently has jaw clenching and tooth grinding tics, neck jerking tics, and grunting and coughing tics.   He had different motor tics and phonic tics in the past. He was treated in the past with Haldol, but he was not on any other meds for this. He has had some balance problems, and fell recently, with no injury to head or otherwise, no LOC.   His Past Medical History Is Significant For: Past Medical History:  Diagnosis Date  . Diabetes ([place])   . OSA (obstructive sleep apnea) 08/05/2013  . Tourette syndrome   . Tourette's syndrome 04/01/2013    His Past Surgical History Is Significant For: Past Surgical History:  Procedure Laterality  Date  . CATARACT EXTRACTION  11/2012  . heart stent  01/16/1996  His Family History Is Significant For: Family History  Problem Relation Age of Onset  . Pancreatic cancer Mother   . Heart disease Father     His Social History Is Significant For: Social History   Socioeconomic History  . Marital status: Married    Spouse name: [name]  . Number of children: 3  . Years of education: college  . Highest education level: Not on file  Occupational History  . Not on file  Social Needs  . Financial resource strain: Not on file  . Food insecurity:    Worry: Not on file    Inability: Not on file  . Transportation needs:    Medical: Not on file    Non-medical: Not on file  Tobacco Use  . Smoking status: Former Research scientist (life sciences)  . Smokeless tobacco: Never Used  . Tobacco comment: Quit over 20 years ago  Substance and Sexual Activity  . Alcohol use: No    Alcohol/week: 0.0 oz  . Drug use: No  . Sexual activity: Not on file  Lifestyle  . Physical activity:    Days per week: Not on file    Minutes per session: Not on file  . Stress: Not on file  Relationships  . Social connections:    Talks on phone: Not on file    Gets together: Not on file    Attends religious service: Not on file    Active member of club or organization: Not on file    Attends meetings of clubs or organizations: Not on file    Relationship status: Not on file  Other Topics Concern  . Not on file  Social History Narrative   Patient consumes 2 cups of coffee daily    His Allergies Are:  No Known Allergies:   His Current Medications Are:  Outpatient Encounter Medications as of 05/26/2018  Medication Sig  . aspirin (ECOTRIN LOW STRENGTH) 81 MG EC tablet Take 81 mg by mouth daily. Swallow whole.  . Cetirizine HCl (ZYRTEC ALLERGY PO) Take by mouth.  . guanFACINE (TENEX) 1 MG tablet Take 2 tablets (2 mg total) by mouth at bedtime. 2 pills each bedtime.  . metoprolol succinate (TOPROL-XL) 25 MG 24 hr tablet Take 25  mg by mouth 2 (two) times daily. One am , one pm  . simvastatin (ZOCOR) 20 MG tablet Take 20 mg by mouth daily.   No facility-administered encounter medications on file as of 05/26/2018.   :  Review of Systems:  Out of a complete 14 point review of systems, all are reviewed and negative with the exception of these symptoms as listed below:  Review of Systems  Neurological:       Pt presents today to discuss his bipap. Pt reports that he is using both [facility] and [facility]. Pt stopped using his bipap while he was waiting for new supplies.    Objective:  Neurological Exam  Physical Exam Physical Examination:   Vitals:   05/26/18 1045  BP: 131/71  Pulse: 63   General Examination: The patient is a very pleasant 77 y.o. male in no acute distress. He appears well-developed and well-nourished and well groomed.   HEENT:He has intermittent jaw clenching tics and very few neck flexion tics and rare grunting and rare yelling out tics and some sniffing and eye rolling tics today,overallstable. He has occasional facial grimacing tics. Pupils are equal, round and reactive to light and accommodation. He is status post left cataract repair. Extraocular tracking is good without limitation  to gaze excursion or nystagmus noted. Normal smooth pursuit is noted. Hearing is grossly intact. Face is symmetric with normal facial animation and normal facial sensation. Speech is clear with no dysarthria noted. There is no hypophonia. There is no lip, neck/head, jaw or voice tremor. NeckFROM.Oropharynx exam reveals: mild mouth dryness, adequate dental hygiene and severe airway crowding. Tongue protrudes centrally and palate elevates symmetrically.   Chest:Clear to auscultation without wheezing, rhonchi or crackles noted.  Heart:S1+S2+0, regular and normal without murmurs, rubs or gallops noted.   Abdomen:Soft, non-tender and non-distended with normal bowel sounds appreciated on  auscultation.  Extremities:There is trace pitting edema in the distal lower extremities bilaterally.  Skin: Warm and dry without trophic changes noted.   Musculoskeletal: exam reveals no obvious joint deformities, tenderness or joint swelling or erythema.   Neurologically:   Mental status: The patient is awake, alert and oriented in all 4 spheres. His memory, attention, language and knowledge are appropriate. There is no aphasia, agnosia, apraxia or anomia. Speech is clear with normal prosody and enunciation with intermittent grunting, sniffing, yelling out, throat clearing, coughing tics. Thought process is linear. Mood is congruent and affect is normal.  Cranial nerves are as described above under HEENT exam. In addition, shoulder shrug is normal with equal shoulder height noted. Motor exam: Normal bulk, strength and tone is noted. Tics are described above. There is no tremor. Fine motor skills aregrosslyintact.  Cerebellar testing shows no dysmetria or intention tremor. There is no truncal or gait ataxia.  Sensory exam is stable.  Gait, station and balance are unremarkable. No veering to one side is noted. No leaning to one side is noted. Posture is age-appropriate and stance is narrow based. No problems turning are noted. Tandem walk is nottried for safety.  Assessment and Plan:   In summary, [name] is a very pleasant 77 year old male with an underlying history of diabetes and obesity, who presents for followup consultation of his Tourette's syndrome as well as severe OSA with a central component, stableon BiPAP.He is up to date with his Rx for Tenex 2 mg at night. He has been compliant with his BiPAP. I will write for a new BiPAP machine, as he may be eligible for a new device this summer. I suggested a 6 month follow-up, sooner as needed. His physical exam is stable, tics are stable. He has been working with a Physiological scientist, has lost weight, is trying to  maintain a healthy lifestyle. He is commended for all this. I answered all her questions today and the patient and his wife were in agreement.  I spent 35 minutes in total face-to-face time with the patient, more than 50% of which was spent in counseling and coordination of care, reviewing test results, reviewing medication and discussing or reviewing the diagnosis of OSA and TS, its prognosis and treatment options. Pertinent laboratory and imaging test results that were available during this visit with the patient were reviewed by me and considered in my medical decision making (see chart for details).

## 2018-06-26 DIAGNOSIS — R69 Illness, unspecified: Secondary | ICD-10-CM | POA: Diagnosis not present

## 2018-08-12 DIAGNOSIS — R69 Illness, unspecified: Secondary | ICD-10-CM | POA: Diagnosis not present

## 2018-08-25 DIAGNOSIS — E1165 Type 2 diabetes mellitus with hyperglycemia: Secondary | ICD-10-CM | POA: Diagnosis not present

## 2018-08-25 DIAGNOSIS — R69 Illness, unspecified: Secondary | ICD-10-CM | POA: Diagnosis not present

## 2018-08-25 DIAGNOSIS — Z6832 Body mass index (BMI) 32.0-32.9, adult: Secondary | ICD-10-CM | POA: Diagnosis not present

## 2018-08-25 DIAGNOSIS — Z712 Person consulting for explanation of examination or test findings: Secondary | ICD-10-CM | POA: Diagnosis not present

## 2018-08-25 DIAGNOSIS — E6609 Other obesity due to excess calories: Secondary | ICD-10-CM | POA: Diagnosis not present

## 2018-08-25 DIAGNOSIS — E782 Mixed hyperlipidemia: Secondary | ICD-10-CM | POA: Diagnosis not present

## 2018-08-25 DIAGNOSIS — I10 Essential (primary) hypertension: Secondary | ICD-10-CM | POA: Diagnosis not present

## 2018-08-27 DIAGNOSIS — R69 Illness, unspecified: Secondary | ICD-10-CM | POA: Diagnosis not present

## 2018-09-01 DIAGNOSIS — G4733 Obstructive sleep apnea (adult) (pediatric): Secondary | ICD-10-CM | POA: Diagnosis not present

## 2018-09-01 DIAGNOSIS — E782 Mixed hyperlipidemia: Secondary | ICD-10-CM | POA: Diagnosis not present

## 2018-09-01 DIAGNOSIS — I10 Essential (primary) hypertension: Secondary | ICD-10-CM | POA: Diagnosis not present

## 2018-09-01 DIAGNOSIS — E6609 Other obesity due to excess calories: Secondary | ICD-10-CM | POA: Diagnosis not present

## 2018-09-01 DIAGNOSIS — Z6832 Body mass index (BMI) 32.0-32.9, adult: Secondary | ICD-10-CM | POA: Diagnosis not present

## 2018-09-01 DIAGNOSIS — E1169 Type 2 diabetes mellitus with other specified complication: Secondary | ICD-10-CM | POA: Diagnosis not present

## 2018-09-01 DIAGNOSIS — R69 Illness, unspecified: Secondary | ICD-10-CM | POA: Diagnosis not present

## 2018-09-01 DIAGNOSIS — I2510 Atherosclerotic heart disease of native coronary artery without angina pectoris: Secondary | ICD-10-CM | POA: Diagnosis not present

## 2018-09-18 DIAGNOSIS — H25011 Cortical age-related cataract, right eye: Secondary | ICD-10-CM | POA: Diagnosis not present

## 2018-09-18 DIAGNOSIS — E119 Type 2 diabetes mellitus without complications: Secondary | ICD-10-CM | POA: Diagnosis not present

## 2018-09-18 DIAGNOSIS — H2511 Age-related nuclear cataract, right eye: Secondary | ICD-10-CM | POA: Diagnosis not present

## 2018-09-18 DIAGNOSIS — H5201 Hypermetropia, right eye: Secondary | ICD-10-CM | POA: Diagnosis not present

## 2018-11-05 DIAGNOSIS — R69 Illness, unspecified: Secondary | ICD-10-CM | POA: Diagnosis not present

## 2018-11-24 ENCOUNTER — Other Ambulatory Visit: Payer: Self-pay | Admitting: Neurology

## 2018-11-24 DIAGNOSIS — F952 Tourette's disorder: Secondary | ICD-10-CM

## 2018-11-27 ENCOUNTER — Telehealth: Payer: Self-pay

## 2018-11-27 NOTE — Telephone Encounter (Signed)
I called pt and reminded him to bring his bipap to his appt with Dr. Rexene Alberts on 12/01/2018. Pt verbalized understanding.

## 2018-12-01 ENCOUNTER — Encounter: Payer: Self-pay | Admitting: Neurology

## 2018-12-01 ENCOUNTER — Ambulatory Visit: Payer: Medicare HMO | Admitting: Neurology

## 2018-12-01 ENCOUNTER — Telehealth: Payer: Self-pay

## 2018-12-01 VITALS — BP 158/90 | HR 67 | Ht 70.0 in | Wt 232.0 lb

## 2018-12-01 DIAGNOSIS — G4733 Obstructive sleep apnea (adult) (pediatric): Secondary | ICD-10-CM | POA: Diagnosis not present

## 2018-12-01 DIAGNOSIS — F952 Tourette's disorder: Secondary | ICD-10-CM

## 2018-12-01 DIAGNOSIS — G4731 Primary central sleep apnea: Secondary | ICD-10-CM | POA: Diagnosis not present

## 2018-12-01 DIAGNOSIS — R69 Illness, unspecified: Secondary | ICD-10-CM | POA: Diagnosis not present

## 2018-12-01 NOTE — Telephone Encounter (Signed)
PA request for guanfacine received from [facility]. Key: [id number]. PA completed via covermymeds and sent to [facility]. Should have a determination in 3-5 business days.

## 2018-12-01 NOTE — Progress Notes (Signed)
Order for new bipap sent to [facility] via community message. Confirmation received that the order transmitted was successful.

## 2018-12-01 NOTE — Progress Notes (Signed)
Subjective:    Patient ID: [name]. is a 78 y.o. male.  HPI     Interim history:   [name] is a very pleasant 78 year old right-handed gentleman with an underlying medical history of coronary artery disease (s/p angioplasty in 1996 and stent in 1997), s/p back surgeries( x 2 at [facility]), hyperlipidemia, obesity, and Tourette's syndrome, who presents for followup consultation of his TS and his severe mixed sleep apnea, on treatment with BiPAP at home. He is unaccompanied today. I last saw him on 05/26/2018, at which time he had trouble using his BiPAP due to lack of supplies. I order new supplies and a new BiPAP machine.   Today, 12/01/18: I reviewed his BiPAP compliance data from 11/01/18 to 11/30/18, which is a total of 30 days, during which time he used his machine 28 days, with percent used days greater than 4 hours of 93%, indicating excellent compliance, with an average usage of 6 hours, and 12 minutes, residual AHI at goal at 2.6/hour, Leak high, with the 95th percentile at 40.1 L/m on a pressure of 15/11 cm. He continues to be compliant with his BiPAP, he would like to get his new machine through a new DME company. He continues to take generic Tenex at night, is up-to-date with his prescription for this.   The patient's allergies, current medications, family history, past medical history, past social history, past surgical history and problem list were reviewed and updated as appropriate.    Previously (copied from previous notes for reference):    :  I saw him 11/25/2017, at which time he requested a new BiPAP machine. He was doing well with his medication regimen.    I saw him on 05/22/2017, at which time he was compliant with his BiPAP. He was not happy with this DME company. I suggested we try to get some new BiPAP machine. I suggested we could try to switch to another DME company. For his tic disorder he was advised to continue with Tenex 2 mg each night.   I reviewed his  BiPAP compliance data from 10/26/2017 through 11/24/2017, which is a total of 30 days, during which time he used his BiPAP 29 days with percent used days greater than 4 hours at 90%, indicating excellent compliance with an average usage of 6 hours and 33 minutes, residual AHI 4.9 per hour, leak consistently high with the 95th percentile at 94.1 L/m on a pressure of 15/11 cm.    I saw him on 10/24/2016, at which time he reported full compliance with his BiPAP but I could not review of compliance data at the time. Overall, he reported doing well, he was working with a Physiological scientist. He was also going to the gym in between. He did have some interim stressors what with his son's health. His Tourette's symptoms were stable on his medication regimen and we mutually agreed to continue with his medications at the current doses.    I saw him on 02/15/2016, at which time he was fully compliant with his BiPAP therapy. He was doing well. He and his wife had enrolled in a study at [place] promoting healthy lifestyle, exercise, weight loss and memory improvement. He had lost a significant amount of weight in the past 6 months and felt much better.   I saw him on 08/09/15, at which time he was fully compliant with his BiPAP, he felt he was doing well. He had some short-term memory issues according to his wife, mostly  mild forgetfulness and misplacing things. His tics sometimes would flare up with shaving or when talking with people. His dentist made him a bite guard because he was also grinding his teeth but he felt his bite guard was too uncomfortable. His weight was stable. He was tolerating the Tenex at the current dose and we mutually agreed to continue with the medication as well as with BiPAP.   I reviewed his BiPAP compliance data from 01/16/2016 through 02/14/2016 which is a total of 30 days during which time he used his machine every night with percent used days greater than 4 hours at 100%, indicating superb  compliance with an average usage of 5 hours and 30 minutes, residual AHI 2.7, leak at times high with the 95th percentile at 41.2 L/m on 15/11 cm.   I saw him on 02/01/2015, at which time he reported compliance with BiPAP therapy, but he had issues with mask air leaking. He needed new supplies. His tics were under reasonable control with Tenex 2 mg each night. He was not always drinking enough water. I suggested we continue with the current dose of Tenex and that he continue with BiPAP at the current settings. He was completely compliant with treatment.   I reviewed his BiPAP compliance data from 07/08/2015 through 08/06/2015 which is a total of 30 days during which time he used his BiPAP every night with percent used days greater than 4 hours was 90%, indicating excellent compliance with an average usage of 5 hours and 36 minutes, residual AHI low at 1.7 per hour, leak for the most part acceptable with the 95th percentile at 24.7 L/m on a pressure of 15/11 cm.   I saw him on 07/29/2014, at which time he reported compliance with BiPAP treatment but he had taken a trip to the mountains and was not able to use his machine. He felt that the increase in Tenex in the recent past had improved his tooth grinding and jaw clenching.     I reviewed his BiPAP compliance data from 12/27/2014 through 01/25/2015 which is a total of 30 days during which time he used his machine every night. Percent used days greater than 4 hours was 97%, indicating excellent compliance. Average usage was 5 hours and 47 minutes, residual AHI low at 1.4 per hour and leak acceptable with the 95th percentile at 20.6 L/m on a pressure of 15/11 cm.   I saw him on 01/27/2014, at which time I suggested he continue with Tenex 2 mg each night and he was encouraged to continue with BiPAP therapy.   I reviewed his compliance data from 06/27/2014 to 07/26/2014 which is a total of 30 days during which time he used his BiPAP on 25 nights. Percent  used days greater than 4 hours was 63%, indicating suboptimal compliance. Average usage of 3 hours and 58 minutes. Residual AHI at 3.7 indicating inadequate pressure setting of 15/11. His leak continued to be quite high many times with the 95th percentile at 76.2 L per minute.   I saw him on 08/05/2013, at which time we talked about his sleep study results and his compliance data. He indicated improvement in his daytime somnolence and sleep consolidation but his compliance was suboptimal at the time. I encouraged him to use BiPAP regularly. He reported improvement of his tics with Tenex. He denied major side effects. I suggested he gradually increase it to 2 mg each night.    I reviewed the patient's BiPAP compliance data from 11/17/2013  to 12/16/2013, which is a total of 30 days, during which time the patient used BiPAP every day. The average usage for all days was 5 hours and 29 minutes. The percent used days greater than 4 hours was 77 %, indicating good compliance. The residual AHI was 3.1 per hour, indicating an adequate treatment pressure of 15/11 cm.     I reviewed his compliance data from 12/28/2013 through 01/26/2014 which is a total of 30 days during which time he used BiPAP every night except for one night. Percent used days greater than 4 hours was 80%, indicating very good compliance, average usage was 5 hours and 7 minutes. His residual AHI is 3.1 which is in the desired range. He is on a pressure of 15/11 cm with spontaneous mode. His leak was high at times.     I first met him on 04/01/2013 for his Hx of TS, at which time he also reported a prior diagnosis of severe OSA, for which he had never been treated. I suggested a trial of Tenex for his Tourette's syndrome. I asked him to come back for sleep study. He had a split-night sleep study on 04/23/2013: His baseline sleep efficiency was reduced at 62.2% with a latency to sleep of 4 minutes and wake after sleep onset of 17 minutes with severe  sleep fragmentation noted. He had a increased percentage of stage I sleep, I reduced percentage of stage II sleep, absence of slow-wave sleep and absence of REM sleep prior to CPAP. His baseline oxygen saturation was 91%, his nadir was 73%. He was noted to have mild to moderate snoring. His AHI was 64 per hour. He was then titrated on CPAP with pressures of 5-12 cm. He had ongoing central apneas and was therefore switched to BiPAP and titrated from 14/10 cm to 15/11 cm. His AHI was reduced to 7.5 events at that pressure. Based on the test results I prescribed BiPAP for the patient at a pressure of 15/11 cm with a large size nasal pillows mask. I reviewed compliance data from 06/15/2013 through 08/02/2013 (49 days), during which time he used it 35 days (71%). Percent used days greater than 4 hours was only 47%. Average usage for all days was 3 hours and 10 minutes an average usage for days use was 4 hours and 26 minutes. His residual AHI was 3.5 per hour, indicating an appropriate BiPAP pressure of 15/11 cm with "easy-breathe" on.   He reported not taking his machine when he goes to the mountains. He felt better with regards to his sleep. He had no major difficulty tolerating BiPAP at this pressure. He had been going to bed late, around 2 AM by habit.   He has had improvement in his tics, which have primarily been jaw clenching and phonic tics with Tenex, which was initially at 1 mg qHS. He had no major side effects.   He started having having phonic tic at age 73. He saw Dr. Cathie Olden at [facility] for CO2 treatments. He had ongoing motor and phonic tics and also had a period of coprolalia over the years and was diagnosed with Tourette's syndrome at age 65 by Dr. Bearl Mulberry, Neurologist at [facility]. 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 His youngest son has tourettes per patient. The patient currently has jaw clenching and tooth grinding tics, neck jerking tics, and grunting and coughing tics.   He had different  motor tics and phonic tics in the past. He was treated in  the past with Haldol, but he was not on any other meds for this. He has had some balance problems, and fell recently, with no injury to head or otherwise, no LOC.   His Past Medical History Is Significant For: Past Medical History:  Diagnosis Date  . Diabetes ([place])   . OSA (obstructive sleep apnea) 08/05/2013  . Tourette syndrome   . Tourette's syndrome 04/01/2013    His Past Surgical History Is Significant For: Past Surgical History:  Procedure Laterality Date  . CATARACT EXTRACTION  11/2012  . heart stent  01/16/1996    His Family History Is Significant For: Family History  Problem Relation Age of Onset  . Pancreatic cancer Mother   . Heart disease Father     His Social History Is Significant For: Social History   Socioeconomic History  . Marital status: Married    Spouse name: Edd Fabian  . Number of children: 3  . Years of education: college  . Highest education level: Not on file  Occupational History  . Not on file  Social Needs  . Financial resource strain: Not on file  . Food insecurity:    Worry: Not on file    Inability: Not on file  . Transportation needs:    Medical: Not on file    Non-medical: Not on file  Tobacco Use  . Smoking status: Former Research scientist (life sciences)  . Smokeless tobacco: Never Used  . Tobacco comment: Quit over 20 years ago  Substance and Sexual Activity  . Alcohol use: No    Alcohol/week: 0.0 standard drinks  . Drug use: No  . Sexual activity: Not on file  Lifestyle  . Physical activity:    Days per week: Not on file    Minutes per session: Not on file  . Stress: Not on file  Relationships  . Social connections:    Talks on phone: Not on file    Gets together: Not on file    Attends religious service: Not on file    Active member of club or organization: Not on file    Attends meetings of clubs or organizations: Not on file    Relationship status: Not on file  Other Topics Concern  . Not  on file  Social History Narrative   Patient consumes 2 cups of coffee daily    His Allergies Are:  No Known Allergies:   His Current Medications Are:  Outpatient Encounter Medications as of 12/01/2018  Medication Sig  . aspirin (ECOTRIN LOW STRENGTH) 81 MG EC tablet Take 81 mg by mouth daily. Swallow whole.  . Cetirizine HCl (ZYRTEC ALLERGY PO) Take by mouth.  . guanFACINE (TENEX) 1 MG tablet TAKE 2 TABLETS BY MOUTH AT BEDTIME.  . metoprolol succinate (TOPROL-XL) 25 MG 24 hr tablet Take 25 mg by mouth 2 (two) times daily. One am , one pm  . simvastatin (ZOCOR) 20 MG tablet Take 20 mg by mouth daily.   No facility-administered encounter medications on file as of 12/01/2018.   : Review of Systems:  Out of a complete 14 point review of systems, all are reviewed and negative with the exception of these symptoms as listed below:  Review of Systems  Neurological:       Pt presents today to discuss his medication for tourette's and his bipap. Pt reports that both are doing well. Pt did not get a new bipap last year. He is unhappy with [facility] and is wondering if he can switch to [facility].  Objective:  Neurological Exam  Physical Exam Physical Examination:   Vitals:   12/01/18 0937  BP: (!) 158/90  Pulse: 67   General Examination: The patient is a very pleasant 78 y.o. male in no acute distress. He appears well-developed and well-nourished and well groomed.   HEENT:He has intermittent jaw clenching tics and very few neck flexion tics and rare grunting and rare yelling out tics and some sniffing and eye rolling tics, especially noticeable when he gets excited. Overall fairly stable appearance. He has occasional facial grimacing tics. Pupils are equal, round and reactive to light and accommodation. He is status post left cataract repair. Extraocular tracking is good without limitation to gaze excursion or nystagmus noted. Normal smooth pursuit is noted. Hearing is grossly intact. Face is  symmetric with normal facial animation and normal facial sensation. Speech is clear with no dysarthria noted. There is no hypophonia. There is no lip, neck/head, jaw or voice tremor. NeckFROM.Oropharynx exam reveals: mild mouth dryness, adequate dental hygiene and severe airway crowding. Tongue protrudes centrally and palate elevates symmetrically.   Chest:Clear to auscultation without wheezing, rhonchi or crackles noted.  Heart:S1+S2+0, regular and normal without murmurs, rubs or gallops noted.   Abdomen:Soft, non-tender and non-distended with normal bowel sounds appreciated on auscultation.  Extremities:There is trace pitting edema in the distal lower extremities bilaterally.  Skin: Warm and dry without trophic changes noted.   Musculoskeletal: exam reveals no obvious joint deformities, tenderness or joint swelling or erythema.   Neurologically:   Mental status: The patient is awake, alert and oriented in all 4 spheres. His memory, attention, language and knowledge are appropriate. There is no aphasia, agnosia, apraxia or anomia. Speech is clear with normal prosody and enunciation with intermittent grunting, sniffing, yelling out, throat clearing, coughing tics. Thought process is linear. Mood is congruent and affect is normal.  Cranial nerves are as described above under HEENT exam.  Motor exam: Normal bulk, strength and tone is noted. Tics are described above. There is no tremor. Fine motor skills aregrosslyintact.  Cerebellar testing shows no dysmetria or intention tremor. There is no truncal or gait ataxia.  Sensory exam is stable.  Gait, station and balance are unremarkable. No veering to one side is noted. No leaning to one side is noted. Posture is age-appropriate and stance is narrow based. No problems turning are noted. Tandem walk is nottried for safety reasons.  Assessment and Plan:   In summary, [name] is a very pleasant 78 year old male  with an underlying history of diabetes and obesity, who presents for followup consultation of his Tourette's syndrome as well as severe OSA with a central component, stableon BiPAP.He is up to date with his Rx for Tenex 2 mg at night. He has been compliant with his BiPAP. I will write for a new BiPAP machine, as he may be eligible. As per his request we will switch to a new DME company as well. He will need to be seen within 60-90 days after starting his new device and we will arrange for follow-up accordingly. His physical exam is stable, tics are stable. He has been working with a Physiological scientist, has lost weight, is trying to maintain a healthy lifestyle. He is commended for all this. I answered all his questions today and the patient was in agreement. I spent 25 minutes in total face-to-face time with the patient, more than 50% of which was spent in counseling and coordination of care, reviewing test results, reviewing medication and  discussing or reviewing the diagnosis of TS, OSA, the prognosis and treatment options. Pertinent laboratory and imaging test results that were available during this visit with the patient were reviewed by me and considered in my medical decision making (see chart for details).

## 2018-12-01 NOTE — Patient Instructions (Addendum)
Please continue using your BiPAP regularly. While your insurance requires that you use BiPAP at least 4 hours each night on 70% of the nights, I recommend, that you not skip any nights and use it throughout the night if you can. Getting used to BiPAP and staying with the treatment long term does take time and patience and discipline. Untreated obstructive sleep apnea when it is moderate to severe can have an adverse impact on cardiovascular health and raise her risk for heart disease, arrhythmias, hypertension, congestive heart failure, stroke and diabetes. Untreated obstructive sleep apnea causes sleep disruption, nonrestorative sleep, and sleep deprivation. This can have an impact on your day to day functioning and cause daytime sleepiness and impairment of cognitive function, memory loss, mood disturbance, and problems focussing. Using BiPAP regularly can improve these symptoms.  Please continue with the guanfacine 2 mg each night.   We will see you in about 3 month, if you will start a new BiPAP machine. We will send your order to [facility], as you requested.

## 2018-12-02 NOTE — Telephone Encounter (Signed)
[employer] has approved coverage for guanfacine effective 10/27/2018-10/29/2019. Ref #: [id number]. [facility] notified.

## 2018-12-04 DIAGNOSIS — I10 Essential (primary) hypertension: Secondary | ICD-10-CM | POA: Diagnosis not present

## 2018-12-04 DIAGNOSIS — E119 Type 2 diabetes mellitus without complications: Secondary | ICD-10-CM | POA: Diagnosis not present

## 2018-12-04 DIAGNOSIS — E1165 Type 2 diabetes mellitus with hyperglycemia: Secondary | ICD-10-CM | POA: Diagnosis not present

## 2018-12-04 DIAGNOSIS — E782 Mixed hyperlipidemia: Secondary | ICD-10-CM | POA: Diagnosis not present

## 2018-12-04 DIAGNOSIS — R69 Illness, unspecified: Secondary | ICD-10-CM | POA: Diagnosis not present

## 2018-12-11 DIAGNOSIS — I10 Essential (primary) hypertension: Secondary | ICD-10-CM | POA: Diagnosis not present

## 2018-12-11 DIAGNOSIS — G4730 Sleep apnea, unspecified: Secondary | ICD-10-CM | POA: Diagnosis not present

## 2018-12-11 DIAGNOSIS — E1169 Type 2 diabetes mellitus with other specified complication: Secondary | ICD-10-CM | POA: Diagnosis not present

## 2018-12-11 DIAGNOSIS — N529 Male erectile dysfunction, unspecified: Secondary | ICD-10-CM | POA: Diagnosis not present

## 2018-12-11 DIAGNOSIS — R69 Illness, unspecified: Secondary | ICD-10-CM | POA: Diagnosis not present

## 2018-12-11 DIAGNOSIS — E6609 Other obesity due to excess calories: Secondary | ICD-10-CM | POA: Diagnosis not present

## 2018-12-11 DIAGNOSIS — E782 Mixed hyperlipidemia: Secondary | ICD-10-CM | POA: Diagnosis not present

## 2018-12-11 DIAGNOSIS — I2510 Atherosclerotic heart disease of native coronary artery without angina pectoris: Secondary | ICD-10-CM | POA: Diagnosis not present

## 2018-12-15 DIAGNOSIS — G4733 Obstructive sleep apnea (adult) (pediatric): Secondary | ICD-10-CM | POA: Diagnosis not present

## 2019-01-13 DIAGNOSIS — G4733 Obstructive sleep apnea (adult) (pediatric): Secondary | ICD-10-CM | POA: Diagnosis not present

## 2019-02-13 DIAGNOSIS — G4733 Obstructive sleep apnea (adult) (pediatric): Secondary | ICD-10-CM | POA: Diagnosis not present

## 2019-02-23 ENCOUNTER — Telehealth: Payer: Self-pay | Admitting: Neurology

## 2019-02-23 NOTE — Telephone Encounter (Signed)
Due to current COVID 19 pandemic, our office is severely reducing in office visits until further notice, in order to minimize the risk to our patients and healthcare providers.   Called patient to offer virtual visit for his 03/02/19 appointment. Patient accepted, and verbalized understanding of the Webex process, and stated that he will have a family member help him. Patient is aware that he will be called by RN and by front office staff. Patient understands that he will receive an e-mail with instructions and the link for connection. I gave patient my direct line so he can get in touch with me with any questions.  Pt understands that although there may be some limitations with this type of visit, we will take all precautions to reduce any security or privacy concerns.  Pt understands that this will be treated like an in office visit and we will file with pt's insurance, and there may be a patient responsible charge related to this service.  Pt's email is [email or web address] Pt understands that the [employer] webex software must be downloaded and operational on the device pt plans to use for the visit.

## 2019-02-26 ENCOUNTER — Ambulatory Visit (INDEPENDENT_AMBULATORY_CARE_PROVIDER_SITE_OTHER): Payer: Medicare HMO | Admitting: Neurology

## 2019-02-26 ENCOUNTER — Encounter: Payer: Self-pay | Admitting: Neurology

## 2019-02-26 ENCOUNTER — Other Ambulatory Visit: Payer: Self-pay

## 2019-02-26 DIAGNOSIS — F952 Tourette's disorder: Secondary | ICD-10-CM | POA: Diagnosis not present

## 2019-02-26 DIAGNOSIS — G4731 Primary central sleep apnea: Secondary | ICD-10-CM

## 2019-02-26 DIAGNOSIS — G4733 Obstructive sleep apnea (adult) (pediatric): Secondary | ICD-10-CM

## 2019-02-26 DIAGNOSIS — R69 Illness, unspecified: Secondary | ICD-10-CM | POA: Diagnosis not present

## 2019-02-26 NOTE — Patient Instructions (Signed)
Given verbally, during today's virtual video-based encounter, with verbal feedback received.   

## 2019-02-26 NOTE — Telephone Encounter (Signed)
I called pt. I spent 20 minutes on the phone with him trying to help him with [email or web address]. He is going to go talk to his son for help and call us back. He wants to see Dr. Rexene Alberts this PM at 2:30pm since he needs help with his son this PM.

## 2019-02-26 NOTE — Telephone Encounter (Signed)
I called pt. He is at his son's help. He was able to a test [email or web address] run with me and was successful.  Pt's meds, allergies, and PMH were updated.

## 2019-02-26 NOTE — Progress Notes (Signed)
Interim history:  [name] is a very pleasant 78 year old right-handed gentleman with an underlying medical history of coronary artery disease (s/p angioplasty in 1996 and stent in 1997), s/p back surgeries(x 2 at [facility]), hyperlipidemia, obesity, and Tourette's syndrome, who presents for a virtual, video -based visit via [email or web address] for FU consultation of his sleep apnea and treatment with BiPAP, after starting a new BiPAP machine. He is unaccompanied today and joins from home, I am in my office. I last saw him on 12/01/2018, at which time he was compliant with his BiPAP. We talked about his eligibility for a new machine. He has started using a new BiPAP machine on 12/15/2018. We kept his generic Tenex the same for his Tourettes syndrome.  Today, 02/26/2019: please also see below for virtual visit documentation.  I reviewed his BiPAP compliance data from 01/27/2019 through 02/25/2019 which is a total of 30 days, during which time he used his machine every night except for 1, with percent used days greater than 4 hours at 90%, indicating excellent compliance with an average usage of 6 hours and 22 minutes, residual AHI at goal at 4.7 per hour, leak on the high side with the 95th percentile at 47.1 L/m on a pressure of 15/11.  The patient's allergies, current medications, family history, past medical history, past social history, past surgical history and problem list were reviewed and updated as appropriate.    Previously (copied from previous notes for reference):    I saw him on 05/26/2018, at which time he had trouble using his BiPAP due to lack of supplies. I order new supplies and a new BiPAP machine.    I reviewed his BiPAP compliance data from 11/01/18 to 11/30/18, which is a total of 30 days, during which time he used his machine 28 days, with percent used days greater than 4 hours of 93%, indicating excellent compliance, with an average usage of 6 hours, and 12 minutes, residual AHI at goal at  2.6/hour, Leak high, with the 95th percentile at 40.1 L/m on a pressure of 15/11 cm.    I saw him 11/25/2017, at which time he requested a new BiPAP machine. He was doing well with his medication regimen.   I saw him on 05/22/2017, at which time he was compliant with his BiPAP. He was not happy with this DME company. I suggested we try to get some new BiPAP machine. I suggested we could try to switch to another DME company. For his tic disorder he was advised to continue with Tenex 2 mg each night.   I reviewed his BiPAP compliance data from 10/26/2017 through 11/24/2017, which is a total of 30 days, during which time he used his BiPAP 29 days with percent used days greater than 4 hours at 90%, indicating excellent compliance with an average usage of 6 hours and 33 minutes, residual AHI 4.9 per hour, leak consistently high with the 95th percentile at 94.1 L/m on a pressure of 15/11 cm.    I saw him on 10/24/2016, at which time he reported full compliance with his BiPAP but I could not review of compliance data at the time. Overall, he reported doing well, he was working with a Physiological scientist. He was also going to the gym in between. He did have some interim stressors what with his son's health. His Tourette's symptoms were stable on his medication regimen and we mutually agreed to continue with his medications at the current doses.    I saw  him on 02/15/2016, at which time he was fully compliant with his BiPAP therapy. He was doing well. He and his wife had enrolled in a study at [place] promoting healthy lifestyle, exercise, weight loss and memory improvement. He had lost a significant amount of weight in the past 6 months and felt much better.   I saw him on 08/09/15, at which time he was fully compliant with his BiPAP, he felt he was doing well. He had some short-term memory issues according to his wife, mostly mild forgetfulness and misplacing things. His tics sometimes would flare up with shaving or  when talking with people. His dentist made him a bite guard because he was also grinding his teeth but he felt his bite guard was too uncomfortable. His weight was stable. He was tolerating the Tenex at the current dose and we mutually agreed to continue with the medication as well as with BiPAP.   I reviewed his BiPAP compliance data from 01/16/2016 through 02/14/2016 which is a total of 30 days during which time he used his machine every night with percent used days greater than 4 hours at 100%, indicating superb compliance with an average usage of 5 hours and 30 minutes, residual AHI 2.7, leak at times high with the 95th percentile at 41.2 L/m on 15/11 cm.   I saw him on 02/01/2015, at which time he reported compliance with BiPAP therapy, but he had issues with mask air leaking. He needed new supplies. His tics were under reasonable control with Tenex 2 mg each night. He was not always drinking enough water. I suggested we continue with the current dose of Tenex and that he continue with BiPAP at the current settings. He was completely compliant with treatment.   I reviewed his BiPAP compliance data from 07/08/2015 through 08/06/2015 which is a total of 30 days during which time he used his BiPAP every night with percent used days greater than 4 hours was 90%, indicating excellent compliance with an average usage of 5 hours and 36 minutes, residual AHI low at 1.7 per hour, leak for the most part acceptable with the 95th percentile at 24.7 L/m on a pressure of 15/11 cm.   I saw him on 07/29/2014, at which time he reported compliance with BiPAP treatment but he had taken a trip to the mountains and was not able to use his machine. He felt that the increase in Tenex in the recent past had improved his tooth grinding and jaw clenching.     I reviewed his BiPAP compliance data from 12/27/2014 through 01/25/2015 which is a total of 30 days during which time he used his machine every night. Percent used days  greater than 4 hours was 97%, indicating excellent compliance. Average usage was 5 hours and 47 minutes, residual AHI low at 1.4 per hour and leak acceptable with the 95th percentile at 20.6 L/m on a pressure of 15/11 cm.   I saw him on 01/27/2014, at which time I suggested he continue with Tenex 2 mg each night and he was encouraged to continue with BiPAP therapy.   I reviewed his compliance data from 06/27/2014 to 07/26/2014 which is a total of 30 days during which time he used his BiPAP on 25 nights. Percent used days greater than 4 hours was 63%, indicating suboptimal compliance. Average usage of 3 hours and 58 minutes. Residual AHI at 3.7 indicating inadequate pressure setting of 15/11. His leak continued to be quite high many times with the  95th percentile at 76.2 L per minute.   I saw him on 08/05/2013, at which time we talked about his sleep study results and his compliance data. He indicated improvement in his daytime somnolence and sleep consolidation but his compliance was suboptimal at the time. I encouraged him to use BiPAP regularly. He reported improvement of his tics with Tenex. He denied major side effects. I suggested he gradually increase it to 2 mg each night.    I reviewed the patient's BiPAP compliance data from 11/17/2013 to 12/16/2013, which is a total of 30 days, during which time the patient used BiPAP every day. The average usage for all days was 5 hours and 29 minutes. The percent used days greater than 4 hours was 77 %, indicating good compliance. The residual AHI was 3.1 per hour, indicating an adequate treatment pressure of 15/11 cm.     I reviewed his compliance data from 12/28/2013 through 01/26/2014 which is a total of 30 days during which time he used BiPAP every night except for one night. Percent used days greater than 4 hours was 80%, indicating very good compliance, average usage was 5 hours and 7 minutes. His residual AHI is 3.1 which is in the desired range. He is  on a pressure of 15/11 cm with spontaneous mode. His leak was high at times.     I first met him on 04/01/2013 for his Hx of TS, at which time he also reported a prior diagnosis of severe OSA, for which he had never been treated. I suggested a trial of Tenex for his Tourette's syndrome. I asked him to come back for sleep study. He had a split-night sleep study on 04/23/2013: His baseline sleep efficiency was reduced at 62.2% with a latency to sleep of 4 minutes and wake after sleep onset of 17 minutes with severe sleep fragmentation noted. He had a increased percentage of stage I sleep, I reduced percentage of stage II sleep, absence of slow-wave sleep and absence of REM sleep prior to CPAP. His baseline oxygen saturation was 91%, his nadir was 73%. He was noted to have mild to moderate snoring. His AHI was 64 per hour. He was then titrated on CPAP with pressures of 5-12 cm. He had ongoing central apneas and was therefore switched to BiPAP and titrated from 14/10 cm to 15/11 cm. His AHI was reduced to 7.5 events at that pressure. Based on the test results I prescribed BiPAP for the patient at a pressure of 15/11 cm with a large size nasal pillows mask. I reviewed compliance data from 06/15/2013 through 08/02/2013 (49 days), during which time he used it 35 days (71%). Percent used days greater than 4 hours was only 47%. Average usage for all days was 3 hours and 10 minutes an average usage for days use was 4 hours and 26 minutes. His residual AHI was 3.5 per hour, indicating an appropriate BiPAP pressure of 15/11 cm with "easy-breathe" on.   He reported not taking his machine when he goes to the mountains. He felt better with regards to his sleep. He had no major difficulty tolerating BiPAP at this pressure. He had been going to bed late, around 2 AM by habit.   He has had improvement in his tics, which have primarily been jaw clenching and phonic tics with Tenex, which was initially at 1 mg qHS. He had no major  side effects.   He started having having phonic tic at age 36. He saw Dr. Cathie Olden  at [facility] for CO2 treatments. He had ongoing motor and phonic tics and also had a period of coprolalia over the years and was diagnosed with Tourette's syndrome at age 44 by Dr. Bearl Mulberry, Neurologist at [facility]. 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 His youngest son has tourettes per patient. The patient currently has jaw clenching and tooth grinding tics, neck jerking tics, and grunting and coughing tics.   He had different motor tics and phonic tics in the past. He was treated in the past with Haldol, but he was not on any other meds for this. He has had some balance problems, and fell recently, with no injury to head or otherwise, no LOC.   His Past Medical History Is Significant For: Past Medical History:  Diagnosis Date   Diabetes ([place])    OSA (obstructive sleep apnea) 08/05/2013   Tourette syndrome    Tourette's syndrome 04/01/2013    His Past Surgical History Is Significant For: Past Surgical History:  Procedure Laterality Date   CATARACT EXTRACTION  11/2012   heart stent  01/16/1996    His Family History Is Significant For: Family History  Problem Relation Age of Onset   Pancreatic cancer Mother    Heart disease Father     His Social History Is Significant For: Social History   Socioeconomic History   Marital status: Married    Spouse name: Edd Fabian   Number of children: 3   Years of education: college   Highest education level: Not on file  Occupational History   Not on file  Social Needs   Financial resource strain: Not on file   Food insecurity:    Worry: Not on file    Inability: Not on file   Transportation needs:    Medical: Not on file    Non-medical: Not on file  Tobacco Use   Smoking status: Former Smoker   Smokeless tobacco: Never Used   Tobacco comment: Quit over 20 years ago  Substance and Sexual Activity   Alcohol use: No    Alcohol/week: 0.0  standard drinks   Drug use: No   Sexual activity: Not on file  Lifestyle   Physical activity:    Days per week: Not on file    Minutes per session: Not on file   Stress: Not on file  Relationships   Social connections:    Talks on phone: Not on file    Gets together: Not on file    Attends religious service: Not on file    Active member of club or organization: Not on file    Attends meetings of clubs or organizations: Not on file    Relationship status: Not on file  Other Topics Concern   Not on file  Social History Narrative   Patient consumes 2 cups of coffee daily    His Allergies Are:  No Known Allergies:   His Current Medications Are:  Outpatient Encounter Medications as of 02/26/2019  Medication Sig   aspirin (ECOTRIN LOW STRENGTH) 81 MG EC tablet Take 81 mg by mouth daily. Swallow whole.   Cetirizine HCl (ZYRTEC ALLERGY PO) Take by mouth.   guanFACINE (TENEX) 1 MG tablet TAKE 2 TABLETS BY MOUTH AT BEDTIME.   metoprolol succinate (TOPROL-XL) 25 MG 24 hr tablet Take 25 mg by mouth 2 (two) times daily. One am , one pm   simvastatin (ZOCOR) 20 MG tablet Take 20 mg by mouth daily.   No facility-administered encounter medications on file  as of 02/26/2019.   :  Review of Systems:  Out of a complete 14 point review of systems, all are reviewed and negative with the exception of these symptoms as listed below:  Virtual Visit via Video Note on 02/26/2019:  I connected with [name] on 02/26/19 at  2:30 PM EDT by a video enabled telemedicine application and verified that I am speaking with the correct person using two identifiers.   I discussed the limitations of evaluation and management by telemedicine and the availability of in person appointments. The patient expressed understanding and agreed to proceed.  History of Present Illness: He reports doing well with his new BiPAP machine, no problem adapting to treatment, he does not have any problems with the  machine. He uses a fullface mask. He realizes that it leak sometimes. He was able to get some repair done on his old machine but uses his new machine from now on, set up date was 12/15/2018. He has been staying at home as much is possible. He did take a trip to his vacation home in [place]. He has had no recent illness. He is stable on his Tenex and does not require a refill. his DME company is [employer] and he is pleased with them. Observations/Objective: There are no recent vital signs available for my review today, the most recent vital signs in the chart are from 12/01/2018. On examination, he is very pleasant, conversant, in no acute distress, face is symmetric with very mild facial grimacing from Tourette's, no significant voice tics today, overall motor tics appear to be a little less prominent than typical, maybe because he is at home and more relaxed in his own environment. Extraocular movements are preserved, upper body dexterity is preserved.  Assessment and Plan: In summary, [name] is a very pleasant 78 year old male with an underlying history of diabetes and obesity, who presents for a virtual, video based followup consultation of his sleep apnea, on treatment with BiPAP. He has Tourette's syndrome as well, he is stable on Tenex 2 mg each night. He has started using his new BiPAP machine in February 2020 and is compliant with it. He is commended for his treatment adherence. He is doing well and benefits from BiPAP therapy. He is doing well with his Tenex and does not require a refill. He is trying to maintain a healthy lifestyle. He is Advised to follow-up routinely in one year. I answered all his questions today and the patient was in agreement.  Follow Up Instructions: 1. Continue BiPAP therapy at current settings with full compliance. He is reminded to change the mask at least every 3 months and change the filter every month.  2. Continue Tenex 2 mg each night, prescription is up  to date. 3. Follow-up in clinic for face-to-face visit in 12 months, sooner if needed. 4. Call or email through My Chart for any interim questions or concerns.     I discussed the assessment and treatment plan with the patient. The patient was provided an opportunity to ask questions and all were answered. The patient agreed with the plan and demonstrated an understanding of the instructions.   The patient was advised to call back or seek an in-person evaluation if the symptoms worsen or if the condition fails to improve as anticipated.  I provided 20 minutes of non-face-to-face time during this encounter.   Star Age, MD

## 2019-03-02 ENCOUNTER — Ambulatory Visit: Payer: Medicare HMO | Admitting: Neurology

## 2019-03-15 DIAGNOSIS — G4733 Obstructive sleep apnea (adult) (pediatric): Secondary | ICD-10-CM | POA: Diagnosis not present

## 2019-03-17 DIAGNOSIS — G4733 Obstructive sleep apnea (adult) (pediatric): Secondary | ICD-10-CM | POA: Diagnosis not present

## 2019-03-18 DIAGNOSIS — R69 Illness, unspecified: Secondary | ICD-10-CM | POA: Diagnosis not present

## 2019-04-02 DIAGNOSIS — E782 Mixed hyperlipidemia: Secondary | ICD-10-CM | POA: Diagnosis not present

## 2019-04-02 DIAGNOSIS — I10 Essential (primary) hypertension: Secondary | ICD-10-CM | POA: Diagnosis not present

## 2019-04-02 DIAGNOSIS — E119 Type 2 diabetes mellitus without complications: Secondary | ICD-10-CM | POA: Diagnosis not present

## 2019-04-02 DIAGNOSIS — E1165 Type 2 diabetes mellitus with hyperglycemia: Secondary | ICD-10-CM | POA: Diagnosis not present

## 2019-04-10 DIAGNOSIS — I2510 Atherosclerotic heart disease of native coronary artery without angina pectoris: Secondary | ICD-10-CM | POA: Diagnosis not present

## 2019-04-10 DIAGNOSIS — E1169 Type 2 diabetes mellitus with other specified complication: Secondary | ICD-10-CM | POA: Diagnosis not present

## 2019-04-10 DIAGNOSIS — R69 Illness, unspecified: Secondary | ICD-10-CM | POA: Diagnosis not present

## 2019-04-10 DIAGNOSIS — G4730 Sleep apnea, unspecified: Secondary | ICD-10-CM | POA: Diagnosis not present

## 2019-04-10 DIAGNOSIS — E6609 Other obesity due to excess calories: Secondary | ICD-10-CM | POA: Diagnosis not present

## 2019-04-10 DIAGNOSIS — I10 Essential (primary) hypertension: Secondary | ICD-10-CM | POA: Diagnosis not present

## 2019-04-10 DIAGNOSIS — N529 Male erectile dysfunction, unspecified: Secondary | ICD-10-CM | POA: Diagnosis not present

## 2019-04-10 DIAGNOSIS — E782 Mixed hyperlipidemia: Secondary | ICD-10-CM | POA: Diagnosis not present

## 2019-04-15 DIAGNOSIS — G4733 Obstructive sleep apnea (adult) (pediatric): Secondary | ICD-10-CM | POA: Diagnosis not present

## 2019-04-16 DIAGNOSIS — R69 Illness, unspecified: Secondary | ICD-10-CM | POA: Diagnosis not present

## 2019-05-15 DIAGNOSIS — Z0000 Encounter for general adult medical examination without abnormal findings: Secondary | ICD-10-CM | POA: Diagnosis not present

## 2019-05-15 DIAGNOSIS — G4733 Obstructive sleep apnea (adult) (pediatric): Secondary | ICD-10-CM | POA: Diagnosis not present

## 2019-06-15 DIAGNOSIS — G4733 Obstructive sleep apnea (adult) (pediatric): Secondary | ICD-10-CM | POA: Diagnosis not present

## 2019-07-14 DIAGNOSIS — G4733 Obstructive sleep apnea (adult) (pediatric): Secondary | ICD-10-CM | POA: Diagnosis not present

## 2019-07-16 DIAGNOSIS — G4733 Obstructive sleep apnea (adult) (pediatric): Secondary | ICD-10-CM | POA: Diagnosis not present

## 2019-07-21 DIAGNOSIS — R69 Illness, unspecified: Secondary | ICD-10-CM | POA: Diagnosis not present

## 2019-07-29 DIAGNOSIS — R69 Illness, unspecified: Secondary | ICD-10-CM | POA: Diagnosis not present

## 2019-08-07 DIAGNOSIS — I10 Essential (primary) hypertension: Secondary | ICD-10-CM | POA: Diagnosis not present

## 2019-08-07 DIAGNOSIS — E1169 Type 2 diabetes mellitus with other specified complication: Secondary | ICD-10-CM | POA: Diagnosis not present

## 2019-08-07 DIAGNOSIS — E782 Mixed hyperlipidemia: Secondary | ICD-10-CM | POA: Diagnosis not present

## 2019-08-07 DIAGNOSIS — E1165 Type 2 diabetes mellitus with hyperglycemia: Secondary | ICD-10-CM | POA: Diagnosis not present

## 2019-08-13 ENCOUNTER — Other Ambulatory Visit (HOSPITAL_COMMUNITY): Payer: Self-pay | Admitting: Internal Medicine

## 2019-08-13 ENCOUNTER — Other Ambulatory Visit: Payer: Self-pay | Admitting: Internal Medicine

## 2019-08-13 DIAGNOSIS — E782 Mixed hyperlipidemia: Secondary | ICD-10-CM | POA: Diagnosis not present

## 2019-08-13 DIAGNOSIS — E1169 Type 2 diabetes mellitus with other specified complication: Secondary | ICD-10-CM | POA: Diagnosis not present

## 2019-08-13 DIAGNOSIS — I2510 Atherosclerotic heart disease of native coronary artery without angina pectoris: Secondary | ICD-10-CM | POA: Diagnosis not present

## 2019-08-13 DIAGNOSIS — I10 Essential (primary) hypertension: Secondary | ICD-10-CM | POA: Diagnosis not present

## 2019-08-13 DIAGNOSIS — G4730 Sleep apnea, unspecified: Secondary | ICD-10-CM | POA: Diagnosis not present

## 2019-08-13 DIAGNOSIS — R69 Illness, unspecified: Secondary | ICD-10-CM | POA: Diagnosis not present

## 2019-08-13 DIAGNOSIS — G9009 Other idiopathic peripheral autonomic neuropathy: Secondary | ICD-10-CM | POA: Diagnosis not present

## 2019-08-13 DIAGNOSIS — N529 Male erectile dysfunction, unspecified: Secondary | ICD-10-CM | POA: Diagnosis not present

## 2019-08-13 DIAGNOSIS — E6609 Other obesity due to excess calories: Secondary | ICD-10-CM | POA: Diagnosis not present

## 2019-08-13 DIAGNOSIS — R809 Proteinuria, unspecified: Secondary | ICD-10-CM | POA: Diagnosis not present

## 2019-08-15 DIAGNOSIS — G4733 Obstructive sleep apnea (adult) (pediatric): Secondary | ICD-10-CM | POA: Diagnosis not present

## 2019-09-15 DIAGNOSIS — G4733 Obstructive sleep apnea (adult) (pediatric): Secondary | ICD-10-CM | POA: Diagnosis not present

## 2019-09-17 DIAGNOSIS — E119 Type 2 diabetes mellitus without complications: Secondary | ICD-10-CM | POA: Diagnosis not present

## 2019-09-17 DIAGNOSIS — H2511 Age-related nuclear cataract, right eye: Secondary | ICD-10-CM | POA: Diagnosis not present

## 2019-09-17 DIAGNOSIS — H5201 Hypermetropia, right eye: Secondary | ICD-10-CM | POA: Diagnosis not present

## 2019-09-17 DIAGNOSIS — H25011 Cortical age-related cataract, right eye: Secondary | ICD-10-CM | POA: Diagnosis not present

## 2019-10-01 ENCOUNTER — Other Ambulatory Visit: Payer: Self-pay

## 2019-10-01 ENCOUNTER — Ambulatory Visit (HOSPITAL_COMMUNITY)
Admission: RE | Admit: 2019-10-01 | Discharge: 2019-10-01 | Disposition: A | Discharge status: Home / Self Care | Payer: Medicare HMO | Source: Ambulatory Visit | Attending: Internal Medicine | Admitting: Internal Medicine

## 2019-10-01 DIAGNOSIS — I2510 Atherosclerotic heart disease of native coronary artery without angina pectoris: Secondary | ICD-10-CM | POA: Diagnosis not present

## 2019-10-01 DIAGNOSIS — R69 Illness, unspecified: Secondary | ICD-10-CM | POA: Diagnosis not present

## 2019-10-26 DIAGNOSIS — R809 Proteinuria, unspecified: Secondary | ICD-10-CM | POA: Diagnosis not present

## 2019-10-26 DIAGNOSIS — I10 Essential (primary) hypertension: Secondary | ICD-10-CM | POA: Diagnosis not present

## 2019-10-26 DIAGNOSIS — I2510 Atherosclerotic heart disease of native coronary artery without angina pectoris: Secondary | ICD-10-CM | POA: Diagnosis not present

## 2019-10-26 DIAGNOSIS — E782 Mixed hyperlipidemia: Secondary | ICD-10-CM | POA: Diagnosis not present

## 2019-10-26 DIAGNOSIS — N529 Male erectile dysfunction, unspecified: Secondary | ICD-10-CM | POA: Diagnosis not present

## 2019-10-26 DIAGNOSIS — R69 Illness, unspecified: Secondary | ICD-10-CM | POA: Diagnosis not present

## 2019-10-26 DIAGNOSIS — E1169 Type 2 diabetes mellitus with other specified complication: Secondary | ICD-10-CM | POA: Diagnosis not present

## 2019-10-26 DIAGNOSIS — G9009 Other idiopathic peripheral autonomic neuropathy: Secondary | ICD-10-CM | POA: Diagnosis not present

## 2019-10-26 DIAGNOSIS — G4730 Sleep apnea, unspecified: Secondary | ICD-10-CM | POA: Diagnosis not present

## 2019-11-20 DIAGNOSIS — G9009 Other idiopathic peripheral autonomic neuropathy: Secondary | ICD-10-CM | POA: Diagnosis not present

## 2019-11-20 DIAGNOSIS — E782 Mixed hyperlipidemia: Secondary | ICD-10-CM | POA: Diagnosis not present

## 2019-11-20 DIAGNOSIS — R809 Proteinuria, unspecified: Secondary | ICD-10-CM | POA: Diagnosis not present

## 2019-11-20 DIAGNOSIS — E1169 Type 2 diabetes mellitus with other specified complication: Secondary | ICD-10-CM | POA: Diagnosis not present

## 2019-11-20 DIAGNOSIS — I10 Essential (primary) hypertension: Secondary | ICD-10-CM | POA: Diagnosis not present

## 2019-11-20 DIAGNOSIS — R69 Illness, unspecified: Secondary | ICD-10-CM | POA: Diagnosis not present

## 2019-11-20 DIAGNOSIS — N529 Male erectile dysfunction, unspecified: Secondary | ICD-10-CM | POA: Diagnosis not present

## 2019-11-20 DIAGNOSIS — I2510 Atherosclerotic heart disease of native coronary artery without angina pectoris: Secondary | ICD-10-CM | POA: Diagnosis not present

## 2019-11-20 DIAGNOSIS — G4730 Sleep apnea, unspecified: Secondary | ICD-10-CM | POA: Diagnosis not present

## 2019-11-23 DIAGNOSIS — R69 Illness, unspecified: Secondary | ICD-10-CM | POA: Diagnosis not present

## 2019-11-26 DIAGNOSIS — R69 Illness, unspecified: Secondary | ICD-10-CM | POA: Diagnosis not present

## 2019-11-30 DIAGNOSIS — G4730 Sleep apnea, unspecified: Secondary | ICD-10-CM | POA: Diagnosis not present

## 2019-11-30 DIAGNOSIS — R809 Proteinuria, unspecified: Secondary | ICD-10-CM | POA: Diagnosis not present

## 2019-11-30 DIAGNOSIS — G9009 Other idiopathic peripheral autonomic neuropathy: Secondary | ICD-10-CM | POA: Diagnosis not present

## 2019-11-30 DIAGNOSIS — I2510 Atherosclerotic heart disease of native coronary artery without angina pectoris: Secondary | ICD-10-CM | POA: Diagnosis not present

## 2019-11-30 DIAGNOSIS — I10 Essential (primary) hypertension: Secondary | ICD-10-CM | POA: Diagnosis not present

## 2019-11-30 DIAGNOSIS — N529 Male erectile dysfunction, unspecified: Secondary | ICD-10-CM | POA: Diagnosis not present

## 2019-11-30 DIAGNOSIS — R69 Illness, unspecified: Secondary | ICD-10-CM | POA: Diagnosis not present

## 2019-11-30 DIAGNOSIS — E782 Mixed hyperlipidemia: Secondary | ICD-10-CM | POA: Diagnosis not present

## 2019-11-30 DIAGNOSIS — E1169 Type 2 diabetes mellitus with other specified complication: Secondary | ICD-10-CM | POA: Diagnosis not present

## 2019-12-13 ENCOUNTER — Ambulatory Visit: Payer: Medicare HMO | Attending: Internal Medicine

## 2019-12-13 ENCOUNTER — Other Ambulatory Visit: Payer: Self-pay

## 2019-12-13 DIAGNOSIS — Z23 Encounter for immunization: Secondary | ICD-10-CM

## 2019-12-13 NOTE — Progress Notes (Signed)
   [id number] Vaccination Clinic  Name:  [name].    MRN: [id number] DOB: 1941-01-24  12/13/2019  [name] was observed post Covid-19 immunization for 15 minutes without incidence. He was provided with Vaccine Information Sheet and instruction to access the V-Safe system.   [name] was instructed to call 911 with any severe reactions post vaccine: Marland Kitchen Difficulty breathing  . Swelling of your face and throat  . A fast heartbeat  . A bad rash all over your body  . Dizziness and weakness    Immunizations Administered    Name Date Dose VIS Date Route   Moderna COVID-19 Vaccine 12/13/2019  1:30 PM 0.5 mL 09/29/2019 Intramuscular   Manufacturer: Moderna   Lot: [id number]   [id number]

## 2020-01-10 ENCOUNTER — Ambulatory Visit: Payer: Medicare HMO | Attending: Internal Medicine

## 2020-01-10 DIAGNOSIS — Z23 Encounter for immunization: Secondary | ICD-10-CM

## 2020-01-10 NOTE — Progress Notes (Signed)
   [id number] Vaccination Clinic  Name:  [name].    MRN: [id number] DOB: December 30, 1940  01/10/2020  Mr. Kosior was observed post Covid-19 immunization for 15 minutes without incident. He was provided with Vaccine Information Sheet and instruction to access the V-Safe system.   Mr. Abruzzese was instructed to call 911 with any severe reactions post vaccine: Marland Kitchen Difficulty breathing  . Swelling of face and throat  . A fast heartbeat  . A bad rash all over body  . Dizziness and weakness   Immunizations Administered    Name Date Dose VIS Date Route   Moderna COVID-19 Vaccine 01/10/2020 12:41 PM 0.5 mL 09/29/2019 Intramuscular   Manufacturer: Moderna   Lot: [id number]   [id number]

## 2020-01-12 DIAGNOSIS — G4733 Obstructive sleep apnea (adult) (pediatric): Secondary | ICD-10-CM | POA: Diagnosis not present

## 2020-01-20 ENCOUNTER — Other Ambulatory Visit: Payer: Self-pay | Admitting: Neurology

## 2020-01-20 DIAGNOSIS — F952 Tourette's disorder: Secondary | ICD-10-CM

## 2020-01-25 ENCOUNTER — Telehealth: Payer: Self-pay

## 2020-01-25 NOTE — Telephone Encounter (Signed)
Received request from [facility] to complete a PA for guanfacine. Completed via covermymeds. [name] - PA Case [id number]. Sent to [facility] [employer].  "Your information has been submitted to [place] [employer] Part D. [employer] will review the request and will issue a decision, typically within 1-3 days from your submission. You can check the updated outcome later by reopening this request.  If [employer] has not responded in 1-3 days or if you have any questions about your ePA request, please contact [place] [employer] Part D at [phone]. If you think there may be a problem with your PA request, use our live chat feature at the bottom right."

## 2020-01-25 NOTE — Telephone Encounter (Signed)
PA approved. This approval authorizes your coverage from 10/30/2019 - 10/28/2020.  [facility] informed.

## 2020-02-01 ENCOUNTER — Ambulatory Visit: Payer: Medicare HMO | Admitting: Neurology

## 2020-02-01 ENCOUNTER — Other Ambulatory Visit: Payer: Self-pay

## 2020-02-01 ENCOUNTER — Encounter: Payer: Self-pay | Admitting: Neurology

## 2020-02-01 VITALS — BP 133/88 | HR 74 | Ht 70.0 in | Wt 215.0 lb

## 2020-02-01 DIAGNOSIS — R69 Illness, unspecified: Secondary | ICD-10-CM | POA: Diagnosis not present

## 2020-02-01 DIAGNOSIS — G4733 Obstructive sleep apnea (adult) (pediatric): Secondary | ICD-10-CM | POA: Diagnosis not present

## 2020-02-01 DIAGNOSIS — F952 Tourette's disorder: Secondary | ICD-10-CM

## 2020-02-01 NOTE — Progress Notes (Signed)
Order for cpap supplies sent to [employer] via community message. Confirmation received that the order transmitted was successful.  

## 2020-02-01 NOTE — Patient Instructions (Addendum)
Please continue using your BiPAP regularly. While your insurance requires that you use BiPAP at least 4 hours each night on 70% of the nights, I recommend, that you not skip any nights and use it throughout the night if you can. Getting used to BiPAP and staying with the treatment long term does take time and patience and discipline. Untreated obstructive sleep apnea when it is moderate to severe can have an adverse impact on cardiovascular health and raise her risk for heart disease, arrhythmias, hypertension, congestive heart failure, stroke and diabetes. Untreated obstructive sleep apnea causes sleep disruption, nonrestorative sleep, and sleep deprivation. This can have an impact on your day to day functioning and cause daytime sleepiness and impairment of cognitive function, memory loss, mood disturbance, and problems focussing. Using BiPAP regularly can improve these symptoms. We will keep your medication for tourette's the same. Your exam is stable. Please follow up in one year.  Keep up the good work with your weight loss endeavors.

## 2020-02-01 NOTE — Progress Notes (Signed)
Subjective:    Patient ID: [name]. is a 79 y.o. male.  HPI     Interim history:   [name] is a very pleasant 79 year old right-handed gentleman with an underlying medical history of coronary artery disease (s/p angioplasty in 1996 and stent in 1997), s/p back surgeries(x 2 at [facility]), hyperlipidemia, obesity, and Tourette's syndrome, who presents for follow-up consultation of his sleep apnea, treated with BiPAP and his Tourette's syndrome.  The patient is unaccompanied today.  I last saw him in a virtual visit on 02/26/2019, at which time he was compliant with his new BiPAP machine.  Today, 02/01/2020: I reviewed his BiPAP compliance data from 12/29/2019 through 01/27/2020, which is a total of 30 days, during which time he used his machine 28 days with percent used days greater than 4 hours at 73%, indicating adequate compliance with an average usage of 4 hours and 47 minutes, residual AHI borderline at 6.3/h, leak high with a 95th percentile at 54.2 L/min on a pressure of 15/11 centimeters.  He reports doing well but needs new headgear.  He called his DME company and was told that he may not be eligible through his insurance until a certain time and lapses.  He is compliant with treatment and uses his old machine as backup when he is at his [place] home.  He is physically very active and has continued to work on weight loss even though he has not been able to work with his personal trainer due to the pandemic.  He got his second dose of the Covid vaccine in March and did well with it.  He was recently started on low-dose lisinopril secondary to proteinuria as I understand.  His Tourette's is stable he feels, he continues to take the medication each night.  The patient's allergies, current medications, family history, past medical history, past social history, past surgical history and problem list were reviewed and updated as appropriate.    Previously (copied from previous notes for reference):     I saw him on 12/01/2018, at which time he was compliant with his BiPAP. We talked about his eligibility for a new machine. He has started using a new BiPAP machine on 12/15/2018. We kept his generic Tenex the same for his Tourette's syndrome.   I reviewed his BiPAP compliance data from 01/27/2019 through 02/25/2019 which is a total of 30 days, during which time he used his machine every night except for 1, with percent used days greater than 4 hours at 90%, indicating excellent compliance with an average usage of 6 hours and 22 minutes, residual AHI at goal at 4.7 per hour, leak on the high side with the 95th percentile at 47.1 L/m on a pressure of 15/11.   I saw him on 05/26/2018, at which time he had trouble using his BiPAP due to lack of supplies. I order new supplies and a new BiPAP machine.    I reviewed his BiPAP compliance data from 11/01/18 to 11/30/18, which is a total of 30 days, during which time he used his machine 28 days, with percent used days greater than 4 hours of 93%, indicating excellent compliance, with an average usage of 6 hours, and 12 minutes, residual AHI at goal at 2.6/hour, Leak high, with the 95th percentile at 40.1 L/m on a pressure of 15/11 cm.    I saw him 11/25/2017, at which time he requested a new BiPAP machine. He was doing well with his medication regimen.  I saw him on 05/22/2017, at which time he was compliant with his BiPAP. He was not happy with this DME company. I suggested we try to get some new BiPAP machine. I suggested we could try to switch to another DME company. For his tic disorder he was advised to continue with Tenex 2 mg each night.   I reviewed his BiPAP compliance data from 10/26/2017 through 11/24/2017, which is a total of 30 days, during which time he used his BiPAP 29 days with percent used days greater than 4 hours at 90%, indicating excellent compliance with an average usage of 6 hours and 33 minutes, residual AHI 4.9 per hour, leak  consistently high with the 95th percentile at 94.1 L/m on a pressure of 15/11 cm.    I saw him on 10/24/2016, at which time he reported full compliance with his BiPAP but I could not review of compliance data at the time. Overall, he reported doing well, he was working with a Physiological scientist. He was also going to the gym in between. He did have some interim stressors what with his son's health. His Tourette's symptoms were stable on his medication regimen and we mutually agreed to continue with his medications at the current doses.    I saw him on 02/15/2016, at which time he was fully compliant with his BiPAP therapy. He was doing well. He and his wife had enrolled in a study at [place] promoting healthy lifestyle, exercise, weight loss and memory improvement. He had lost a significant amount of weight in the past 6 months and felt much better.   I saw him on 08/09/15, at which time he was fully compliant with his BiPAP, he felt he was doing well. He had some short-term memory issues according to his wife, mostly mild forgetfulness and misplacing things. His tics sometimes would flare up with shaving or when talking with people. His dentist made him a bite guard because he was also grinding his teeth but he felt his bite guard was too uncomfortable. His weight was stable. He was tolerating the Tenex at the current dose and we mutually agreed to continue with the medication as well as with BiPAP.   I reviewed his BiPAP compliance data from 01/16/2016 through 02/14/2016 which is a total of 30 days during which time he used his machine every night with percent used days greater than 4 hours at 100%, indicating superb compliance with an average usage of 5 hours and 30 minutes, residual AHI 2.7, leak at times high with the 95th percentile at 41.2 L/m on 15/11 cm.   I saw him on 02/01/2015, at which time he reported compliance with BiPAP therapy, but he had issues with mask air leaking. He needed new supplies.  His tics were under reasonable control with Tenex 2 mg each night. He was not always drinking enough water. I suggested we continue with the current dose of Tenex and that he continue with BiPAP at the current settings. He was completely compliant with treatment.   I reviewed his BiPAP compliance data from 07/08/2015 through 08/06/2015 which is a total of 30 days during which time he used his BiPAP every night with percent used days greater than 4 hours was 90%, indicating excellent compliance with an average usage of 5 hours and 36 minutes, residual AHI low at 1.7 per hour, leak for the most part acceptable with the 95th percentile at 24.7 L/m on a pressure of 15/11 cm.   I saw him  on 07/29/2014, at which time he reported compliance with BiPAP treatment but he had taken a trip to the mountains and was not able to use his machine. He felt that the increase in Tenex in the recent past had improved his tooth grinding and jaw clenching.     I reviewed his BiPAP compliance data from 12/27/2014 through 01/25/2015 which is a total of 30 days during which time he used his machine every night. Percent used days greater than 4 hours was 97%, indicating excellent compliance. Average usage was 5 hours and 47 minutes, residual AHI low at 1.4 per hour and leak acceptable with the 95th percentile at 20.6 L/m on a pressure of 15/11 cm.   I saw him on 01/27/2014, at which time I suggested he continue with Tenex 2 mg each night and he was encouraged to continue with BiPAP therapy.   I reviewed his compliance data from 06/27/2014 to 07/26/2014 which is a total of 30 days during which time he used his BiPAP on 25 nights. Percent used days greater than 4 hours was 63%, indicating suboptimal compliance. Average usage of 3 hours and 58 minutes. Residual AHI at 3.7 indicating inadequate pressure setting of 15/11. His leak continued to be quite high many times with the 95th percentile at 76.2 L per minute.   I saw him on  08/05/2013, at which time we talked about his sleep study results and his compliance data. He indicated improvement in his daytime somnolence and sleep consolidation but his compliance was suboptimal at the time. I encouraged him to use BiPAP regularly. He reported improvement of his tics with Tenex. He denied major side effects. I suggested he gradually increase it to 2 mg each night.    I reviewed the patient's BiPAP compliance data from 11/17/2013 to 12/16/2013, which is a total of 30 days, during which time the patient used BiPAP every day. The average usage for all days was 5 hours and 29 minutes. The percent used days greater than 4 hours was 77 %, indicating good compliance. The residual AHI was 3.1 per hour, indicating an adequate treatment pressure of 15/11 cm.     I reviewed his compliance data from 12/28/2013 through 01/26/2014 which is a total of 30 days during which time he used BiPAP every night except for one night. Percent used days greater than 4 hours was 80%, indicating very good compliance, average usage was 5 hours and 7 minutes. His residual AHI is 3.1 which is in the desired range. He is on a pressure of 15/11 cm with spontaneous mode. His leak was high at times.     I first met him on 04/01/2013 for his Hx of TS, at which time he also reported a prior diagnosis of severe OSA, for which he had never been treated. I suggested a trial of Tenex for his Tourette's syndrome. I asked him to come back for sleep study. He had a split-night sleep study on 04/23/2013: His baseline sleep efficiency was reduced at 62.2% with a latency to sleep of 4 minutes and wake after sleep onset of 17 minutes with severe sleep fragmentation noted. He had a increased percentage of stage I sleep, I reduced percentage of stage II sleep, absence of slow-wave sleep and absence of REM sleep prior to CPAP. His baseline oxygen saturation was 91%, his nadir was 73%. He was noted to have mild to moderate snoring. His AHI  was 64 per hour. He was then titrated on CPAP with pressures  of 5-12 cm. He had ongoing central apneas and was therefore switched to BiPAP and titrated from 14/10 cm to 15/11 cm. His AHI was reduced to 7.5 events at that pressure. Based on the test results I prescribed BiPAP for the patient at a pressure of 15/11 cm with a large size nasal pillows mask. I reviewed compliance data from 06/15/2013 through 08/02/2013 (49 days), during which time he used it 35 days (71%). Percent used days greater than 4 hours was only 47%. Average usage for all days was 3 hours and 10 minutes an average usage for days use was 4 hours and 26 minutes. His residual AHI was 3.5 per hour, indicating an appropriate BiPAP pressure of 15/11 cm with "easy-breathe" on.   He reported not taking his machine when he goes to the mountains. He felt better with regards to his sleep. He had no major difficulty tolerating BiPAP at this pressure. He had been going to bed late, around 2 AM by habit.   He has had improvement in his tics, which have primarily been jaw clenching and phonic tics with Tenex, which was initially at 1 mg qHS. He had no major side effects.   He started having having phonic tic at age 81. He saw Dr. Cathie Olden at [facility] for CO2 treatments. He had ongoing motor and phonic tics and also had a period of coprolalia over the years and was diagnosed with Tourette's syndrome at age 89 by Dr. Bearl Mulberry, Neurologist at [facility]. 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 His youngest son has tourettes per patient. The patient currently has jaw clenching and tooth grinding tics, neck jerking tics, and grunting and coughing tics.   He had different motor tics and phonic tics in the past. He was treated in the past with Haldol, but he was not on any other meds for this. He has had some balance problems, and fell recently, with no injury to head or otherwise, no LOC.  His Past Medical History Is Significant For: Past Medical History:   Diagnosis Date  . Diabetes ([place])   . OSA (obstructive sleep apnea) 08/05/2013  . Tourette syndrome   . Tourette's syndrome 04/01/2013    His Past Surgical History Is Significant For: Past Surgical History:  Procedure Laterality Date  . CATARACT EXTRACTION  11/2012  . heart stent  01/16/1996    His Family History Is Significant For: Family History  Problem Relation Age of Onset  . Pancreatic cancer Mother   . Heart disease Father     His Social History Is Significant For: Social History   Socioeconomic History  . Marital status: Married    Spouse name: Edd Fabian  . Number of children: 3  . Years of education: college  . Highest education level: Not on file  Occupational History  . Not on file  Tobacco Use  . Smoking status: Former Research scientist (life sciences)  . Smokeless tobacco: Never Used  . Tobacco comment: Quit over 20 years ago  Substance and Sexual Activity  . Alcohol use: No    Alcohol/week: 0.0 standard drinks  . Drug use: No  . Sexual activity: Not on file  Other Topics Concern  . Not on file  Social History Narrative   Patient consumes 2 cups of coffee daily   Social Determinants of Health   Financial Resource Strain:   . Difficulty of Paying Living Expenses:   Food Insecurity:   . Worried About Charity fundraiser in the Last Year:   .  Ran Out of Food in the Last Year:   Transportation Needs:   . Film/video editor (Medical):   Marland Kitchen Lack of Transportation (Non-Medical):   Physical Activity:   . Days of Exercise per Week:   . Minutes of Exercise per Session:   Stress:   . Feeling of Stress :   Social Connections:   . Frequency of Communication with Friends and Family:   . Frequency of Social Gatherings with Friends and Family:   . Attends Religious Services:   . Active Member of Clubs or Organizations:   . Attends Archivist Meetings:   Marland Kitchen Marital Status:     His Allergies Are:  No Known Allergies:   His Current Medications Are:  Outpatient Encounter  Medications as of 02/01/2020  Medication Sig  . aspirin (ECOTRIN LOW STRENGTH) 81 MG EC tablet Take 81 mg by mouth daily. Swallow whole.  . Cetirizine HCl (ZYRTEC ALLERGY PO) Take by mouth.  . guanFACINE (TENEX) 1 MG tablet TAKE 2 TABLETS BY MOUTH AT BEDTIME  . lisinopril (ZESTRIL) 2.5 MG tablet Take 2.5 mg by mouth daily.  . metoprolol succinate (TOPROL-XL) 25 MG 24 hr tablet Take 25 mg by mouth 2 (two) times daily. One am , one pm  . simvastatin (ZOCOR) 20 MG tablet Take 20 mg by mouth daily.   No facility-administered encounter medications on file as of 02/01/2020.  :  Review of Systems:  Out of a complete 14 point review of systems, all are reviewed and negative with the exception of these symptoms as listed below: Review of Systems  Neurological:       Pt presents today to discuss his cpap. Pt reports that it is going well.    Objective:  Neurological Exam  Physical Exam Physical Examination:   Vitals:   02/01/20 1256  BP: 133/88  Pulse: 74    General Examination: The patient is a very pleasant 79 y.o. male in no acute distress. He appears well-developed and well-nourished and well groomed.   HEENT:He has intermittent jaw clenching tics and very few neck flexion tics and rare grunting and rare yelling out tics and some sniffing and eye rolling tics, especially noticeable when he gets excited while talking, all stable, including facial grimacing tics. Pupils are equal, round and reactive to light. Extraocular tracking is good without limitation to gaze excursion or nystagmus noted. Normal smooth pursuit is noted. Hearing is grossly intact. Face is symmetric with normal facial animation and normal facial sensation. Speech is clear with no dysarthria noted. There is no hypophonia. There is no lip, neck/head, jaw or voice tremor. NeckFROM.Oropharynx exam reveals: no new change. Tongue protrudes centrally and palate elevates symmetrically.   Chest:Clear to auscultation without  wheezing, rhonchi or crackles noted.  Heart:S1+S2+0, regular and normal without murmurs, rubs or gallops noted.   Abdomen:Soft, non-tender and non-distended with normal bowel sounds appreciated on auscultation.  Extremities:There is trace pitting edema in the distal right lower extremity.  Skin: Warm and dry without trophic changes noted.   Musculoskeletal: exam reveals no obvious joint deformities, tenderness or joint swelling or erythema.   Neurologically:   Mental status: The patient is awake, alert and oriented in all 4 spheres. His memory, attention, language and knowledge are appropriate. There is no aphasia, agnosia, apraxia or anomia. Speech is clear with normal prosody and enunciation with intermittent grunting, sniffing, yelling out, throat clearing, coughing tics. Thought process is linear. Mood is congruent and affect is normal.  Cranial nerves  are as described above under HEENT exam.  Motor exam: Normal bulk, strength and tone is noted. Tics are described above. There is no tremor. Fine motor skills aregrosslyintact.  Cerebellar testing shows no dysmetria or intention tremor. There is no truncal or gait ataxia.  Sensory exam is stable.  Gait, station and balance are unremarkable. No veering to one side is noted. No leaning to one side is noted. Posture is more stooped. No problems walking and turning are noted. Tandem walk is nottried for safety reasons.  Assessment and Plan:   In summary, RUSTIN ERHART is a very pleasant 79 year old male with an underlying history of diabetes and obesity, who presents for followup consultation of his Tourette's syndrome as well as severe OSA with a central component, stableon BiPAP.He is up to date with his Rx for Tenex 2 mg at night. He has been compliant with his BiPAP, received a new machine about a year ago. His physical exam is stable, tics arestable.He has been working on weight loss and is trying to  maintain a healthy lifestyle. He is commended forallthis.  He is advised to continue with generic Tenex for his Tourette's syndrome and to use his BiPAP consistently.  He is advised to follow-up routinely in 1 year.  I answered all his questions today and he was in agreement.  I wrote for new PAP supplies. I spent 30 minutes in total face-to-face time and in reviewing records during pre-charting, more than 50% of which was spent in counseling and coordination of care, reviewing test results, reviewing medications and treatment regimen and/or in discussing or reviewing the diagnosis of OSA, TS, the prognosis and treatment options. Pertinent laboratory and imaging test results that were available during this visit with the patient were reviewed by me and considered in my medical decision making (see chart for details).

## 2020-02-05 DIAGNOSIS — G9009 Other idiopathic peripheral autonomic neuropathy: Secondary | ICD-10-CM | POA: Diagnosis not present

## 2020-02-05 DIAGNOSIS — N529 Male erectile dysfunction, unspecified: Secondary | ICD-10-CM | POA: Diagnosis not present

## 2020-02-05 DIAGNOSIS — I10 Essential (primary) hypertension: Secondary | ICD-10-CM | POA: Diagnosis not present

## 2020-02-05 DIAGNOSIS — R809 Proteinuria, unspecified: Secondary | ICD-10-CM | POA: Diagnosis not present

## 2020-02-05 DIAGNOSIS — E1169 Type 2 diabetes mellitus with other specified complication: Secondary | ICD-10-CM | POA: Diagnosis not present

## 2020-02-05 DIAGNOSIS — G4730 Sleep apnea, unspecified: Secondary | ICD-10-CM | POA: Diagnosis not present

## 2020-02-05 DIAGNOSIS — R69 Illness, unspecified: Secondary | ICD-10-CM | POA: Diagnosis not present

## 2020-02-05 DIAGNOSIS — I2510 Atherosclerotic heart disease of native coronary artery without angina pectoris: Secondary | ICD-10-CM | POA: Diagnosis not present

## 2020-02-05 DIAGNOSIS — E782 Mixed hyperlipidemia: Secondary | ICD-10-CM | POA: Diagnosis not present

## 2020-03-02 DIAGNOSIS — R69 Illness, unspecified: Secondary | ICD-10-CM | POA: Diagnosis not present

## 2020-03-24 DIAGNOSIS — Z809 Family history of malignant neoplasm, unspecified: Secondary | ICD-10-CM | POA: Diagnosis not present

## 2020-03-24 DIAGNOSIS — Z6833 Body mass index (BMI) 33.0-33.9, adult: Secondary | ICD-10-CM | POA: Diagnosis not present

## 2020-03-24 DIAGNOSIS — N529 Male erectile dysfunction, unspecified: Secondary | ICD-10-CM | POA: Diagnosis not present

## 2020-03-24 DIAGNOSIS — E669 Obesity, unspecified: Secondary | ICD-10-CM | POA: Diagnosis not present

## 2020-03-24 DIAGNOSIS — I2510 Atherosclerotic heart disease of native coronary artery without angina pectoris: Secondary | ICD-10-CM | POA: Diagnosis not present

## 2020-03-24 DIAGNOSIS — Z008 Encounter for other general examination: Secondary | ICD-10-CM | POA: Diagnosis not present

## 2020-03-24 DIAGNOSIS — R809 Proteinuria, unspecified: Secondary | ICD-10-CM | POA: Diagnosis not present

## 2020-03-24 DIAGNOSIS — E785 Hyperlipidemia, unspecified: Secondary | ICD-10-CM | POA: Diagnosis not present

## 2020-03-24 DIAGNOSIS — Z87891 Personal history of nicotine dependence: Secondary | ICD-10-CM | POA: Diagnosis not present

## 2020-03-24 DIAGNOSIS — Z8249 Family history of ischemic heart disease and other diseases of the circulatory system: Secondary | ICD-10-CM | POA: Diagnosis not present

## 2020-04-11 DIAGNOSIS — R69 Illness, unspecified: Secondary | ICD-10-CM | POA: Diagnosis not present

## 2020-06-28 DIAGNOSIS — R69 Illness, unspecified: Secondary | ICD-10-CM | POA: Diagnosis not present

## 2020-07-21 DIAGNOSIS — R69 Illness, unspecified: Secondary | ICD-10-CM | POA: Diagnosis not present

## 2020-08-11 DIAGNOSIS — N529 Male erectile dysfunction, unspecified: Secondary | ICD-10-CM | POA: Diagnosis not present

## 2020-08-11 DIAGNOSIS — R69 Illness, unspecified: Secondary | ICD-10-CM | POA: Diagnosis not present

## 2020-08-11 DIAGNOSIS — E1169 Type 2 diabetes mellitus with other specified complication: Secondary | ICD-10-CM | POA: Diagnosis not present

## 2020-08-11 DIAGNOSIS — G4730 Sleep apnea, unspecified: Secondary | ICD-10-CM | POA: Diagnosis not present

## 2020-08-11 DIAGNOSIS — I10 Essential (primary) hypertension: Secondary | ICD-10-CM | POA: Diagnosis not present

## 2020-08-11 DIAGNOSIS — I2510 Atherosclerotic heart disease of native coronary artery without angina pectoris: Secondary | ICD-10-CM | POA: Diagnosis not present

## 2020-08-11 DIAGNOSIS — E782 Mixed hyperlipidemia: Secondary | ICD-10-CM | POA: Diagnosis not present

## 2020-08-11 DIAGNOSIS — R809 Proteinuria, unspecified: Secondary | ICD-10-CM | POA: Diagnosis not present

## 2020-08-11 DIAGNOSIS — G9009 Other idiopathic peripheral autonomic neuropathy: Secondary | ICD-10-CM | POA: Diagnosis not present

## 2020-09-30 ENCOUNTER — Other Ambulatory Visit: Payer: Self-pay | Admitting: Neurology

## 2020-09-30 DIAGNOSIS — F952 Tourette's disorder: Secondary | ICD-10-CM

## 2020-10-26 DIAGNOSIS — E782 Mixed hyperlipidemia: Secondary | ICD-10-CM | POA: Diagnosis not present

## 2020-10-26 DIAGNOSIS — I10 Essential (primary) hypertension: Secondary | ICD-10-CM | POA: Diagnosis not present

## 2020-10-26 DIAGNOSIS — R69 Illness, unspecified: Secondary | ICD-10-CM | POA: Diagnosis not present

## 2020-10-26 DIAGNOSIS — G4733 Obstructive sleep apnea (adult) (pediatric): Secondary | ICD-10-CM | POA: Diagnosis not present

## 2020-10-26 DIAGNOSIS — Z125 Encounter for screening for malignant neoplasm of prostate: Secondary | ICD-10-CM | POA: Diagnosis not present

## 2020-10-26 DIAGNOSIS — I2510 Atherosclerotic heart disease of native coronary artery without angina pectoris: Secondary | ICD-10-CM | POA: Diagnosis not present

## 2020-10-26 DIAGNOSIS — I519 Heart disease, unspecified: Secondary | ICD-10-CM | POA: Diagnosis not present

## 2020-10-26 DIAGNOSIS — G9009 Other idiopathic peripheral autonomic neuropathy: Secondary | ICD-10-CM | POA: Diagnosis not present

## 2020-10-26 DIAGNOSIS — R809 Proteinuria, unspecified: Secondary | ICD-10-CM | POA: Diagnosis not present

## 2020-10-26 DIAGNOSIS — E119 Type 2 diabetes mellitus without complications: Secondary | ICD-10-CM | POA: Diagnosis not present

## 2020-11-02 DIAGNOSIS — G9009 Other idiopathic peripheral autonomic neuropathy: Secondary | ICD-10-CM | POA: Diagnosis not present

## 2020-11-02 DIAGNOSIS — E6609 Other obesity due to excess calories: Secondary | ICD-10-CM | POA: Diagnosis not present

## 2020-11-02 DIAGNOSIS — I10 Essential (primary) hypertension: Secondary | ICD-10-CM | POA: Diagnosis not present

## 2020-11-02 DIAGNOSIS — E1169 Type 2 diabetes mellitus with other specified complication: Secondary | ICD-10-CM | POA: Diagnosis not present

## 2020-11-02 DIAGNOSIS — G4730 Sleep apnea, unspecified: Secondary | ICD-10-CM | POA: Diagnosis not present

## 2020-11-02 DIAGNOSIS — R809 Proteinuria, unspecified: Secondary | ICD-10-CM | POA: Diagnosis not present

## 2020-11-02 DIAGNOSIS — I2510 Atherosclerotic heart disease of native coronary artery without angina pectoris: Secondary | ICD-10-CM | POA: Diagnosis not present

## 2020-11-02 DIAGNOSIS — R69 Illness, unspecified: Secondary | ICD-10-CM | POA: Diagnosis not present

## 2020-11-02 DIAGNOSIS — N529 Male erectile dysfunction, unspecified: Secondary | ICD-10-CM | POA: Diagnosis not present

## 2020-11-02 DIAGNOSIS — E782 Mixed hyperlipidemia: Secondary | ICD-10-CM | POA: Diagnosis not present

## 2020-11-22 DIAGNOSIS — G4733 Obstructive sleep apnea (adult) (pediatric): Secondary | ICD-10-CM | POA: Diagnosis not present

## 2020-11-26 DIAGNOSIS — R809 Proteinuria, unspecified: Secondary | ICD-10-CM | POA: Diagnosis not present

## 2020-11-26 DIAGNOSIS — E782 Mixed hyperlipidemia: Secondary | ICD-10-CM | POA: Diagnosis not present

## 2020-11-26 DIAGNOSIS — G4730 Sleep apnea, unspecified: Secondary | ICD-10-CM | POA: Diagnosis not present

## 2020-11-26 DIAGNOSIS — G9009 Other idiopathic peripheral autonomic neuropathy: Secondary | ICD-10-CM | POA: Diagnosis not present

## 2020-11-26 DIAGNOSIS — E1169 Type 2 diabetes mellitus with other specified complication: Secondary | ICD-10-CM | POA: Diagnosis not present

## 2020-11-26 DIAGNOSIS — I10 Essential (primary) hypertension: Secondary | ICD-10-CM | POA: Diagnosis not present

## 2020-11-26 DIAGNOSIS — I2510 Atherosclerotic heart disease of native coronary artery without angina pectoris: Secondary | ICD-10-CM | POA: Diagnosis not present

## 2020-11-26 DIAGNOSIS — N529 Male erectile dysfunction, unspecified: Secondary | ICD-10-CM | POA: Diagnosis not present

## 2020-11-26 DIAGNOSIS — R69 Illness, unspecified: Secondary | ICD-10-CM | POA: Diagnosis not present

## 2020-12-06 ENCOUNTER — Telehealth: Payer: Self-pay

## 2020-12-06 NOTE — Telephone Encounter (Signed)
PA for Guanfacine was attempted to be sent to pt's insurance via [place] and fax. Insurance would not accept PA. Will follow. There is question if PA is even needed for this med.

## 2020-12-13 NOTE — Progress Notes (Signed)
Subjective: 1. Gross hematuria   2. Urinary tract infection with hematuria, site unspecified   3. History of nephrolithiasis   4. BPH with urinary obstruction   5. Urgency of urination      [name] is an 80 yo male who is sent by Dr. Nevada Crane for hematuria.  He has a history of stones with prior ESWL in 2008 for a mixed calcium stone.  He has had some recent increased voiding symptoms.  His IPSS is 15 with a reduced stream and he has key in the door urgency with some UUI.   He has some intermittency.   He had an episode about 3 weeks ago.  He had been on Super Beta prostate and has been eating a lot of dried beets as well.  He may have had a small clot with the hematuria.  He had a few episodes over a week to 10 days.  He has had no flank pain.   His UA today has >30 RBC's, WBC 6-10 and mod bacteria.   His Cr was 0.86 in 10/20.  His PSA was only 0.6.  ROS:  ROS  No Known Allergies  Past Medical History:  Diagnosis Date  . Coronary artery disease   . Diabetes ([place])   . Hypercholesteremia   . OSA (obstructive sleep apnea) 08/05/2013  . Sleep apnea   . Tourette syndrome   . Tourette's syndrome 04/01/2013    Past Surgical History:  Procedure Laterality Date  . CATARACT EXTRACTION  11/2012  . heart stent  01/16/1996    Social History   Socioeconomic History  . Marital status: Married    Spouse name: Edd Fabian  . Number of children: 3  . Years of education: college  . Highest education level: Not on file  Occupational History  . Not on file  Tobacco Use  . Smoking status: Former Research scientist (life sciences)  . Smokeless tobacco: Never Used  . Tobacco comment: Quit over 20 years ago  Substance and Sexual Activity  . Alcohol use: No    Alcohol/week: 0.0 standard drinks  . Drug use: No  . Sexual activity: Not on file  Other Topics Concern  . Not on file  Social History Narrative   Patient consumes 2 cups of coffee daily   Social Determinants of Health   Financial Resource Strain: Not on file   Food Insecurity: Not on file  Transportation Needs: Not on file  Physical Activity: Not on file  Stress: Not on file  Social Connections: Not on file  Intimate Partner Violence: Not on file    Family History  Problem Relation Age of Onset  . Pancreatic cancer Mother   . Heart disease Father     Anti-infectives: Anti-infectives (From admission, onward)   None      Current Outpatient Medications  Medication Sig Dispense Refill  . aspirin 81 MG EC tablet Take 81 mg by mouth daily. Swallow whole.    . Cetirizine HCl (ZYRTEC ALLERGY PO) Take by mouth.    . guanFACINE (TENEX) 1 MG tablet TAKE 2 TABLETS BY MOUTH AT BEDTIME 180 tablet 2  . lisinopril (ZESTRIL) 2.5 MG tablet Take 2.5 mg by mouth daily.    . metoprolol succinate (TOPROL-XL) 25 MG 24 hr tablet Take 25 mg by mouth 2 (two) times daily. One am , one pm    . simvastatin (ZOCOR) 20 MG tablet Take 20 mg by mouth daily.     No current facility-administered medications for this visit.  Objective: Vital signs in last 24 hours: BP 107/70   Pulse 63   Temp 98.4 F (36.9 C)   Ht 5\' 10"  (1.778 m)   Wt 214 lb (97.1 kg)   BMI 30.71 kg/m   Intake/Output from previous day: No intake/output data recorded. Intake/Output this shift: @IOTHISSHIFT @   Physical Exam Vitals reviewed.  Constitutional:      Appearance: Normal appearance.  HENT:     Head: Normocephalic and atraumatic.  Cardiovascular:     Rate and Rhythm: Normal rate and regular rhythm.  Pulmonary:     Effort: Pulmonary effort is normal.  Abdominal:     General: Abdomen is flat.     Palpations: Abdomen is soft.     Tenderness: There is no abdominal tenderness.     Hernia: No hernia is present.  Genitourinary:    Comments: Normal penis and meatus. Scrotum is normal. Left testicle is absent/non-palpable Right testicle is normal. AP without lesions. NST without mass.  Prostate 2+ benign. SV non-palpable.  Musculoskeletal:        General: No  swelling or tenderness. Normal range of motion.     Cervical back: Normal range of motion and neck supple.  Skin:    General: Skin is warm and dry.  Neurological:     General: No focal deficit present.     Mental Status: He is alert and oriented to person, place, and time.  Psychiatric:        Mood and Affect: Mood normal.        Behavior: Behavior normal.     Lab Results:  See UA and labs in history. Results for orders placed or performed in visit on 12/15/20 (from the past 24 hour(s))  Urinalysis, Routine w reflex microscopic     Status: Abnormal   Collection Time: 12/15/20  9:32 AM  Result Value Ref Range   Specific Gravity, UA 1.025 1.005 - 1.030   pH, UA 7.5 5.0 - 7.5   Color, UA Amber (A) Yellow   Appearance Ur Cloudy (A) Clear   Leukocytes,UA Trace (A) Negative   Protein,UA 3+ (A) Negative/Trace   Glucose, UA Negative Negative   Ketones, UA Negative Negative   RBC, UA 3+ (A) Negative   Bilirubin, UA Negative Negative   Urobilinogen, Ur 1.0 0.2 - 1.0 mg/dL   Nitrite, UA Negative Negative   Microscopic Examination See below:    Narrative   Performed at:  [facility] [address] [place], [place]  [id number] Lab Director: Mina Marble MT, Phone:  [phone]  Microscopic Examination     Status: Abnormal   Collection Time: 12/15/20  9:32 AM   Urine  Result Value Ref Range   WBC, UA 6-10 (A) 0 - 5 /hpf   RBC >30 (A) 0 - 2 /hpf   Epithelial Cells (non renal) 0-10 0 - 10 /hpf   Renal Epithel, UA None seen None seen /hpf   Mucus, UA Present Not Estab.   Bacteria, UA Moderate (A) None seen/Few   Narrative   Performed at:  [facility] [address] [place], [place]  [id number] Lab Director: Beatrice, Phone:  [phone]    Studies/Results:  He brought his labs.    Assessment/Plan: Gross hematuria.   He has a history of stones and a possible UTI.   I will get a culture and treat if positive.  I will get a CT hematuria  and have him return for a possible  cystoscopy.   No orders of the defined types were placed in this encounter.    Orders Placed This Encounter  Procedures  . Microscopic Examination  . CT HEMATURIA WORKUP    Standing Status:   Future    Standing Expiration Date:   01/12/2021    Order Specific Question:   Reason for Exam (SYMPTOM  OR DIAGNOSIS REQUIRED)    Answer:   hematuria    Order Specific Question:   Preferred imaging location?    Answer:   [facility]    Order Specific Question:   Radiology Contrast Protocol - do NOT remove file path    Answer:   [email or web address]  . Urinalysis, Routine w reflex microscopic  . Basic metabolic panel     Return for Next available with CT report for cystoscopy. .    CC: Dr. Wende Neighbors.      Irine Seal 12/15/2020 [phone]

## 2020-12-15 ENCOUNTER — Encounter: Payer: Self-pay | Admitting: Urology

## 2020-12-15 ENCOUNTER — Other Ambulatory Visit: Payer: Self-pay

## 2020-12-15 ENCOUNTER — Ambulatory Visit (INDEPENDENT_AMBULATORY_CARE_PROVIDER_SITE_OTHER): Payer: Medicare HMO | Admitting: Urology

## 2020-12-15 VITALS — BP 107/70 | HR 63 | Temp 98.4°F | Ht 70.0 in | Wt 214.0 lb

## 2020-12-15 DIAGNOSIS — R310 Gross hematuria: Secondary | ICD-10-CM | POA: Diagnosis not present

## 2020-12-15 DIAGNOSIS — N138 Other obstructive and reflux uropathy: Secondary | ICD-10-CM

## 2020-12-15 DIAGNOSIS — Z87442 Personal history of urinary calculi: Secondary | ICD-10-CM | POA: Diagnosis not present

## 2020-12-15 DIAGNOSIS — R3915 Urgency of urination: Secondary | ICD-10-CM

## 2020-12-15 DIAGNOSIS — N390 Urinary tract infection, site not specified: Secondary | ICD-10-CM

## 2020-12-15 DIAGNOSIS — R319 Hematuria, unspecified: Secondary | ICD-10-CM

## 2020-12-15 DIAGNOSIS — N401 Enlarged prostate with lower urinary tract symptoms: Secondary | ICD-10-CM

## 2020-12-15 LAB — URINALYSIS, ROUTINE W REFLEX MICROSCOPIC
Bilirubin, UA: NEGATIVE
Glucose, UA: NEGATIVE
Ketones, UA: NEGATIVE
Nitrite, UA: NEGATIVE
Specific Gravity, UA: 1.025 (ref 1.005–1.030)
Urobilinogen, Ur: 1 mg/dL (ref 0.2–1.0)
pH, UA: 7.5 (ref 5.0–7.5)

## 2020-12-15 LAB — MICROSCOPIC EXAMINATION
RBC, Urine: >30 /hpf — AB (ref 0–2)
Renal Epithel, UA: NONE SEEN /hpf

## 2020-12-15 NOTE — Progress Notes (Signed)
Urological Symptom Review  Patient is experiencing the following symptoms: Frequent urination Leakage of urine Trouble starting stream Blood in urine Weak stream Erection problems (male only)   Review of Systems  Gastrointestinal (upper)  : Negative for upper GI symptoms  Gastrointestinal (lower) : Negative for lower GI symptoms  Constitutional : Negative for symptoms  Skin: Negative for skin symptoms  Eyes: Negative for eye symptoms  Ear/Nose/Throat : Negative for Ear/Nose/Throat symptoms  Hematologic/Lymphatic: Negative for Hematologic/Lymphatic symptoms  Cardiovascular : Negative for cardiovascular symptoms  Respiratory : Negative for respiratory symptoms  Endocrine: Negative for endocrine symptoms  Musculoskeletal: Negative for musculoskeletal symptoms  Neurological: Negative for neurological symptoms  Psychologic: Negative for psychiatric symptoms

## 2020-12-23 DIAGNOSIS — G4733 Obstructive sleep apnea (adult) (pediatric): Secondary | ICD-10-CM | POA: Diagnosis not present

## 2021-01-03 ENCOUNTER — Ambulatory Visit (HOSPITAL_COMMUNITY)
Admission: RE | Admit: 2021-01-03 | Discharge: 2021-01-03 | Disposition: A | Discharge status: Home / Self Care | Payer: Medicare HMO | Source: Ambulatory Visit | Attending: Urology | Admitting: Urology

## 2021-01-03 ENCOUNTER — Other Ambulatory Visit: Payer: Self-pay

## 2021-01-03 DIAGNOSIS — R319 Hematuria, unspecified: Secondary | ICD-10-CM | POA: Diagnosis not present

## 2021-01-03 DIAGNOSIS — N200 Calculus of kidney: Secondary | ICD-10-CM | POA: Diagnosis not present

## 2021-01-03 DIAGNOSIS — R310 Gross hematuria: Secondary | ICD-10-CM | POA: Insufficient documentation

## 2021-01-03 DIAGNOSIS — R350 Frequency of micturition: Secondary | ICD-10-CM | POA: Diagnosis not present

## 2021-01-03 DIAGNOSIS — N281 Cyst of kidney, acquired: Secondary | ICD-10-CM | POA: Diagnosis not present

## 2021-01-03 LAB — POCT I-STAT CREATININE: Creatinine, Ser: 0.9 mg/dL (ref 0.61–1.24)

## 2021-01-03 MED ORDER — IOHEXOL 300 MG/ML  SOLN
125.0000 mL | Freq: Once | INTRAMUSCULAR | Status: AC | PRN
  Administered 2021-01-03: 125 mL via INTRAVENOUS

## 2021-01-04 ENCOUNTER — Telehealth: Payer: Self-pay

## 2021-01-04 NOTE — H&P (View-Only) (Signed)
Subjective: 1. Gross hematuria      12/15/20: [name] is an 80 yo male who is sent by Dr. Nevada Crane for hematuria.  He has a history of stones with prior ESWL in 2008 for a mixed calcium stone.  He has had some recent increased voiding symptoms.  His IPSS is 15 with a reduced stream and he has key in the door urgency with some UUI.   He has some intermittency.   He had an episode about 3 weeks ago.  He had been on Super Beta prostate and has been eating a lot of dried beets as well.  He may have had a small clot with the hematuria.  He had a few episodes over a week to 10 days.  He has had no flank pain.   His UA today has >30 RBC's, WBC 6-10 and mod bacteria.   His Cr was 0.86 in 10/20.  His PSA was only 0.6.   01/05/21: [name] returns today in f/u.  The CT hematuria study shows bladder findings consistent with multifocal urothelial neoplasms.   ROS:  ROS  No Known Allergies  Past Medical History:  Diagnosis Date  . Coronary artery disease   . Diabetes ([place])   . Hypercholesteremia   . OSA (obstructive sleep apnea) 08/05/2013  . Sleep apnea   . Tourette syndrome   . Tourette's syndrome 04/01/2013    Past Surgical History:  Procedure Laterality Date  . CATARACT EXTRACTION  11/2012  . heart stent  01/16/1996    Social History   Socioeconomic History  . Marital status: Married    Spouse name: Edd Fabian  . Number of children: 3  . Years of education: college  . Highest education level: Not on file  Occupational History  . Not on file  Tobacco Use  . Smoking status: Former Research scientist (life sciences)  . Smokeless tobacco: Never Used  . Tobacco comment: Quit over 20 years ago  Substance and Sexual Activity  . Alcohol use: No    Alcohol/week: 0.0 standard drinks  . Drug use: No  . Sexual activity: Not on file  Other Topics Concern  . Not on file  Social History Narrative   Patient consumes 2 cups of coffee daily   Social Determinants of Health   Financial Resource Strain: Not on file  Food  Insecurity: Not on file  Transportation Needs: Not on file  Physical Activity: Not on file  Stress: Not on file  Social Connections: Not on file  Intimate Partner Violence: Not on file    Family History  Problem Relation Age of Onset  . Pancreatic cancer Mother   . Heart disease Father     Anti-infectives: Anti-infectives (From admission, onward)   Start     Dose/Rate Route Frequency Ordered Stop   01/05/21 1330  CIPROFLOXACIN HCL 500 MG PO TABS        500 mg Oral  Once 01/05/21 1327        Current Outpatient Medications  Medication Sig Dispense Refill  . aspirin 81 MG EC tablet Take 81 mg by mouth daily. Swallow whole.    . Cetirizine HCl (ZYRTEC ALLERGY PO) Take by mouth.    . Coenzyme Q10 (COQ-10) 100 MG CAPS Take by mouth.    . guanFACINE (TENEX) 1 MG tablet TAKE 2 TABLETS BY MOUTH AT BEDTIME 180 tablet 2  . lisinopril (ZESTRIL) 2.5 MG tablet Take 2.5 mg by mouth daily.    . metoprolol succinate (TOPROL-XL) 25 MG 24 hr  tablet Take 25 mg by mouth 2 (two) times daily. One am , one pm    . Multiple Vitamin (MULTIVITAMIN WITH MINERALS) TABS tablet Take 1 tablet by mouth daily.    . simvastatin (ZOCOR) 20 MG tablet Take 20 mg by mouth daily.    . vitamin C (ASCORBIC ACID) 500 MG tablet Take 500 mg by mouth daily.     Current Facility-Administered Medications  Medication Dose Route Frequency Provider Last Rate Last Admin  . ciprofloxacin (CIPRO) tablet 500 mg  500 mg Oral Once Irine Seal, MD         Objective: Vital signs in last 24 hours: BP 127/73   Pulse 68   Temp 97.7 F (36.5 C)   Ht 5\' 10"  (1.778 m)   Wt 214 lb (97.1 kg)   BMI 30.71 kg/m   Intake/Output from previous day: No intake/output data recorded. Intake/Output this shift: @IOTHISSHIFT @   Physical Exam  Lab Results:  See UA and labs in history. No results found for this or any previous visit (from the past 24 hour(s)).  Studies/Results: IMPRESSION: 1. Grossly abnormal appearance of the  bladder with multiple irregular, partially calcified enhancing bladder masses, most consistent with multifocal transitional cell carcinoma. Cystoscopy recommended. 2. No evidence of metastatic disease. 3. Nonobstructing bilateral renal calculi. No evidence of ureteral calculus or obstruction. Bilateral renal cysts. 4. Aortic Atherosclerosis (ICD10-I70.0). 3.7 cm abdominal aortic aneurysm. Recommend follow-up ultrasound every 2 years. This recommendation follows ACR consensus guidelines: White Paper of the ACR Incidental Findings Committee II on Vascular Findings. J Am Coll Radiol 2013; 10:789-794. 5. These results will be called to the ordering clinician or representative by the Radiologist Assistant, and communication documented in the PACS or [employer].   Electronically Signed   By: Richardean Sale M.D.   On: 01/03/2021 13:51   Procedure:   Flexible cystoscopy  He was placed supine and prepped with betadine and 2% lidocaine jelly.  Cipro 500mg  was given po.  The flexible scope was passed.  The urethra is normal.  The prostate with 4cm with bilobar hyperplasia with coaptation.  There is a papillary tumor at the left bladder neck and multiple large papillary tumors of the dome, lateral and posterior walls, right > left.  There were some areas with possible dystrophic calcification.  The trigone was not clearly involved but the urine was slightly cloudy and the UO's were not well seen.  There was moderate trabeculation and some erythema, possibly consistent with CIS on the inferior posterior wall.     Assessment/Plan: Multifocal urothelial neoplasms with recent hematuria.   He is going to need a TURBT with possible instillation of Gemcitabine and I will be prepared to place stents if needed.  I reviewed the risks of bleeding, infection, bladder and ureteral injury, chemical cystitis, thrombotic events and anesthetic complications.  I will have him scheduled for a possible  overnight stay because of the extent of disease.      Renal stones.  Non-obstructing and will just need monitoring.      3.7cm AAA.   This will need monitoring over time.   Meds ordered this encounter  Medications  . ciprofloxacin (CIPRO) tablet 500 mg     Orders Placed This Encounter  Procedures  . Urinalysis, Routine w reflex microscopic     Return for Schedule surgery ASAP. Marland Kitchen    CC: Dr. Wende Neighbors.      Irine Seal 01/05/2021 [phone]

## 2021-01-04 NOTE — Telephone Encounter (Signed)
-----   Message from Irine Seal, MD sent at 01/04/2021  7:38 AM EST ----- Mr. Schreur has some bladder findings that need further evaluation and needs cystoscopy sooner than mid April.  Can we get him added in this week or next.

## 2021-01-04 NOTE — H&P (View-Only) (Signed)
Subjective: 1. Gross hematuria      12/15/20: [name] is an 80 yo male who is sent by Dr. Nevada Crane for hematuria.  He has a history of stones with prior ESWL in 2008 for a mixed calcium stone.  He has had some recent increased voiding symptoms.  His IPSS is 15 with a reduced stream and he has key in the door urgency with some UUI.   He has some intermittency.   He had an episode about 3 weeks ago.  He had been on Super Beta prostate and has been eating a lot of dried beets as well.  He may have had a small clot with the hematuria.  He had a few episodes over a week to 10 days.  He has had no flank pain.   His UA today has >30 RBC's, WBC 6-10 and mod bacteria.   His Cr was 0.86 in 10/20.  His PSA was only 0.6.   01/05/21: [name] returns today in f/u.  The CT hematuria study shows bladder findings consistent with multifocal urothelial neoplasms.   ROS:  ROS  No Known Allergies  Past Medical History:  Diagnosis Date  . Coronary artery disease   . Diabetes ([place])   . Hypercholesteremia   . OSA (obstructive sleep apnea) 08/05/2013  . Sleep apnea   . Tourette syndrome   . Tourette's syndrome 04/01/2013    Past Surgical History:  Procedure Laterality Date  . CATARACT EXTRACTION  11/2012  . heart stent  01/16/1996    Social History   Socioeconomic History  . Marital status: Married    Spouse name: Edd Fabian  . Number of children: 3  . Years of education: college  . Highest education level: Not on file  Occupational History  . Not on file  Tobacco Use  . Smoking status: Former Research scientist (life sciences)  . Smokeless tobacco: Never Used  . Tobacco comment: Quit over 20 years ago  Substance and Sexual Activity  . Alcohol use: No    Alcohol/week: 0.0 standard drinks  . Drug use: No  . Sexual activity: Not on file  Other Topics Concern  . Not on file  Social History Narrative   Patient consumes 2 cups of coffee daily   Social Determinants of Health   Financial Resource Strain: Not on file  Food  Insecurity: Not on file  Transportation Needs: Not on file  Physical Activity: Not on file  Stress: Not on file  Social Connections: Not on file  Intimate Partner Violence: Not on file    Family History  Problem Relation Age of Onset  . Pancreatic cancer Mother   . Heart disease Father     Anti-infectives: Anti-infectives (From admission, onward)   Start     Dose/Rate Route Frequency Ordered Stop   01/05/21 1330  CIPROFLOXACIN HCL 500 MG PO TABS        500 mg Oral  Once 01/05/21 1327        Current Outpatient Medications  Medication Sig Dispense Refill  . aspirin 81 MG EC tablet Take 81 mg by mouth daily. Swallow whole.    . Cetirizine HCl (ZYRTEC ALLERGY PO) Take by mouth.    . Coenzyme Q10 (COQ-10) 100 MG CAPS Take by mouth.    . guanFACINE (TENEX) 1 MG tablet TAKE 2 TABLETS BY MOUTH AT BEDTIME 180 tablet 2  . lisinopril (ZESTRIL) 2.5 MG tablet Take 2.5 mg by mouth daily.    . metoprolol succinate (TOPROL-XL) 25 MG 24 hr  tablet Take 25 mg by mouth 2 (two) times daily. One am , one pm    . Multiple Vitamin (MULTIVITAMIN WITH MINERALS) TABS tablet Take 1 tablet by mouth daily.    . simvastatin (ZOCOR) 20 MG tablet Take 20 mg by mouth daily.    . vitamin C (ASCORBIC ACID) 500 MG tablet Take 500 mg by mouth daily.     Current Facility-Administered Medications  Medication Dose Route Frequency Provider Last Rate Last Admin  . ciprofloxacin (CIPRO) tablet 500 mg  500 mg Oral Once Irine Seal, MD         Objective: Vital signs in last 24 hours: BP 127/73   Pulse 68   Temp 97.7 F (36.5 C)   Ht 5\' 10"  (1.778 m)   Wt 214 lb (97.1 kg)   BMI 30.71 kg/m   Intake/Output from previous day: No intake/output data recorded. Intake/Output this shift: @IOTHISSHIFT @   Physical Exam  Lab Results:  See UA and labs in history. No results found for this or any previous visit (from the past 24 hour(s)).  Studies/Results: IMPRESSION: 1. Grossly abnormal appearance of the  bladder with multiple irregular, partially calcified enhancing bladder masses, most consistent with multifocal transitional cell carcinoma. Cystoscopy recommended. 2. No evidence of metastatic disease. 3. Nonobstructing bilateral renal calculi. No evidence of ureteral calculus or obstruction. Bilateral renal cysts. 4. Aortic Atherosclerosis (ICD10-I70.0). 3.7 cm abdominal aortic aneurysm. Recommend follow-up ultrasound every 2 years. This recommendation follows ACR consensus guidelines: White Paper of the ACR Incidental Findings Committee II on Vascular Findings. J Am Coll Radiol 2013; 10:789-794. 5. These results will be called to the ordering clinician or representative by the Radiologist Assistant, and communication documented in the PACS or [employer].   Electronically Signed   By: Richardean Sale M.D.   On: 01/03/2021 13:51   Procedure:   Flexible cystoscopy  He was placed supine and prepped with betadine and 2% lidocaine jelly.  Cipro 500mg  was given po.  The flexible scope was passed.  The urethra is normal.  The prostate with 4cm with bilobar hyperplasia with coaptation.  There is a papillary tumor at the left bladder neck and multiple large papillary tumors of the dome, lateral and posterior walls, right > left.  There were some areas with possible dystrophic calcification.  The trigone was not clearly involved but the urine was slightly cloudy and the UO's were not well seen.  There was moderate trabeculation and some erythema, possibly consistent with CIS on the inferior posterior wall.     Assessment/Plan: Multifocal urothelial neoplasms with recent hematuria.   He is going to need a TURBT with possible instillation of Gemcitabine and I will be prepared to place stents if needed.  I reviewed the risks of bleeding, infection, bladder and ureteral injury, chemical cystitis, thrombotic events and anesthetic complications.  I will have him scheduled for a possible  overnight stay because of the extent of disease.      Renal stones.  Non-obstructing and will just need monitoring.      3.7cm AAA.   This will need monitoring over time.   Meds ordered this encounter  Medications  . ciprofloxacin (CIPRO) tablet 500 mg     Orders Placed This Encounter  Procedures  . Urinalysis, Routine w reflex microscopic     Return for Schedule surgery ASAP. Marland Kitchen    CC: Dr. Wende Neighbors.      Irine Seal 01/05/2021 [phone]

## 2021-01-04 NOTE — Progress Notes (Signed)
Pt scheduled for 3/10 at 1:00 and notified.

## 2021-01-04 NOTE — Telephone Encounter (Signed)
Called pt and scheduled him for tomorrow at 1:00 per Dr. Denton Lank note.

## 2021-01-04 NOTE — Progress Notes (Signed)
Subjective: 1. Gross hematuria      12/15/20: [name] is an 80 yo male who is sent by Dr. Nevada Crane for hematuria.  He has a history of stones with prior ESWL in 2008 for a mixed calcium stone.  He has had some recent increased voiding symptoms.  His IPSS is 15 with a reduced stream and he has key in the door urgency with some UUI.   He has some intermittency.   He had an episode about 3 weeks ago.  He had been on Super Beta prostate and has been eating a lot of dried beets as well.  He may have had a small clot with the hematuria.  He had a few episodes over a week to 10 days.  He has had no flank pain.   His UA today has >30 RBC's, WBC 6-10 and mod bacteria.   His Cr was 0.86 in 10/20.  His PSA was only 0.6.   01/05/21: [name] returns today in f/u.  The CT hematuria study shows bladder findings consistent with multifocal urothelial neoplasms.   ROS:  ROS  No Known Allergies  Past Medical History:  Diagnosis Date  . Coronary artery disease   . Diabetes ([place])   . Hypercholesteremia   . OSA (obstructive sleep apnea) 08/05/2013  . Sleep apnea   . Tourette syndrome   . Tourette's syndrome 04/01/2013    Past Surgical History:  Procedure Laterality Date  . CATARACT EXTRACTION  11/2012  . heart stent  01/16/1996    Social History   Socioeconomic History  . Marital status: Married    Spouse name: [name]  . Number of children: 3  . Years of education: college  . Highest education level: Not on file  Occupational History  . Not on file  Tobacco Use  . Smoking status: Former Research scientist (life sciences)  . Smokeless tobacco: Never Used  . Tobacco comment: Quit over 20 years ago  Substance and Sexual Activity  . Alcohol use: No    Alcohol/week: 0.0 standard drinks  . Drug use: No  . Sexual activity: Not on file  Other Topics Concern  . Not on file  Social History Narrative   Patient consumes 2 cups of coffee daily   Social Determinants of Health   Financial Resource Strain: Not on file  Food  Insecurity: Not on file  Transportation Needs: Not on file  Physical Activity: Not on file  Stress: Not on file  Social Connections: Not on file  Intimate Partner Violence: Not on file    Family History  Problem Relation Age of Onset  . Pancreatic cancer Mother   . Heart disease Father     Anti-infectives: Anti-infectives (From admission, onward)   Start     Dose/Rate Route Frequency Ordered Stop   01/05/21 1330  CIPROFLOXACIN HCL 500 MG PO TABS        500 mg Oral  Once 01/05/21 1327        Current Outpatient Medications  Medication Sig Dispense Refill  . aspirin 81 MG EC tablet Take 81 mg by mouth daily. Swallow whole.    . Cetirizine HCl (ZYRTEC ALLERGY PO) Take by mouth.    . Coenzyme Q10 (COQ-10) 100 MG CAPS Take by mouth.    . guanFACINE (TENEX) 1 MG tablet TAKE 2 TABLETS BY MOUTH AT BEDTIME 180 tablet 2  . lisinopril (ZESTRIL) 2.5 MG tablet Take 2.5 mg by mouth daily.    . metoprolol succinate (TOPROL-XL) 25 MG 24 hr  tablet Take 25 mg by mouth 2 (two) times daily. One am , one pm    . Multiple Vitamin (MULTIVITAMIN WITH MINERALS) TABS tablet Take 1 tablet by mouth daily.    . simvastatin (ZOCOR) 20 MG tablet Take 20 mg by mouth daily.    . vitamin C (ASCORBIC ACID) 500 MG tablet Take 500 mg by mouth daily.     Current Facility-Administered Medications  Medication Dose Route Frequency Provider Last Rate Last Admin  . ciprofloxacin (CIPRO) tablet 500 mg  500 mg Oral Once Irine Seal, MD         Objective: Vital signs in last 24 hours: BP 127/73   Pulse 68   Temp 97.7 F (36.5 C)   Ht 5\' 10"  (1.778 m)   Wt 214 lb (97.1 kg)   BMI 30.71 kg/m   Intake/Output from previous day: No intake/output data recorded. Intake/Output this shift: @IOTHISSHIFT @   Physical Exam  Lab Results:  See UA and labs in history. No results found for this or any previous visit (from the past 24 hour(s)).  Studies/Results: IMPRESSION: 1. Grossly abnormal appearance of the  bladder with multiple irregular, partially calcified enhancing bladder masses, most consistent with multifocal transitional cell carcinoma. Cystoscopy recommended. 2. No evidence of metastatic disease. 3. Nonobstructing bilateral renal calculi. No evidence of ureteral calculus or obstruction. Bilateral renal cysts. 4. Aortic Atherosclerosis (ICD10-I70.0). 3.7 cm abdominal aortic aneurysm. Recommend follow-up ultrasound every 2 years. This recommendation follows ACR consensus guidelines: White Paper of the ACR Incidental Findings Committee II on Vascular Findings. J Am Coll Radiol 2013; 10:789-794. 5. These results will be called to the ordering clinician or representative by the Radiologist Assistant, and communication documented in the PACS or [employer].   Electronically Signed   By: Richardean Sale M.D.   On: 01/03/2021 13:51   Procedure:   Flexible cystoscopy  He was placed supine and prepped with betadine and 2% lidocaine jelly.  Cipro 500mg  was given po.  The flexible scope was passed.  The urethra is normal.  The prostate with 4cm with bilobar hyperplasia with coaptation.  There is a papillary tumor at the left bladder neck and multiple large papillary tumors of the dome, lateral and posterior walls, right > left.  There were some areas with possible dystrophic calcification.  The trigone was not clearly involved but the urine was slightly cloudy and the UO's were not well seen.  There was moderate trabeculation and some erythema, possibly consistent with CIS on the inferior posterior wall.     Assessment/Plan: Multifocal urothelial neoplasms with recent hematuria.   He is going to need a TURBT with possible instillation of Gemcitabine and I will be prepared to place stents if needed.  I reviewed the risks of bleeding, infection, bladder and ureteral injury, chemical cystitis, thrombotic events and anesthetic complications.  I will have him scheduled for a possible  overnight stay because of the extent of disease.      Renal stones.  Non-obstructing and will just need monitoring.      3.7cm AAA.   This will need monitoring over time.   Meds ordered this encounter  Medications  . ciprofloxacin (CIPRO) tablet 500 mg     Orders Placed This Encounter  Procedures  . Urinalysis, Routine w reflex microscopic     Return for Schedule surgery ASAP. Marland Kitchen    CC: Dr. Wende Neighbors.      Irine Seal 01/05/2021 [phone]

## 2021-01-05 ENCOUNTER — Other Ambulatory Visit: Payer: Self-pay

## 2021-01-05 ENCOUNTER — Ambulatory Visit (INDEPENDENT_AMBULATORY_CARE_PROVIDER_SITE_OTHER): Payer: Medicare HMO | Admitting: Urology

## 2021-01-05 VITALS — BP 127/73 | HR 68 | Temp 97.7°F | Ht 70.0 in | Wt 214.0 lb

## 2021-01-05 DIAGNOSIS — C678 Malignant neoplasm of overlapping sites of bladder: Secondary | ICD-10-CM | POA: Diagnosis not present

## 2021-01-05 DIAGNOSIS — R310 Gross hematuria: Secondary | ICD-10-CM | POA: Diagnosis not present

## 2021-01-05 DIAGNOSIS — I714 Abdominal aortic aneurysm, without rupture, unspecified: Secondary | ICD-10-CM

## 2021-01-05 DIAGNOSIS — N200 Calculus of kidney: Secondary | ICD-10-CM

## 2021-01-05 LAB — MICROSCOPIC EXAMINATION
Epithelial Cells (non renal): NONE SEEN /hpf (ref 0–10)
RBC, Urine: >30 /hpf — AB (ref 0–2)
Renal Epithel, UA: NONE SEEN /hpf
WBC, UA: NONE SEEN /hpf (ref 0–5)

## 2021-01-05 LAB — URINALYSIS, ROUTINE W REFLEX MICROSCOPIC
Bilirubin, UA: NEGATIVE
Glucose, UA: NEGATIVE
Ketones, UA: NEGATIVE
Leukocytes,UA: NEGATIVE
Nitrite, UA: NEGATIVE
Specific Gravity, UA: >1.03 — ABNORMAL HIGH (ref 1.005–1.030)
Urobilinogen, Ur: 4 mg/dL — ABNORMAL HIGH (ref 0.2–1.0)
pH, UA: 6 (ref 5.0–7.5)

## 2021-01-05 MED ORDER — CIPROFLOXACIN HCL 500 MG PO TABS
500.0000 mg | ORAL_TABLET | Freq: Once | ORAL | Status: AC
Start: 2021-01-05 — End: 2021-01-05
  Administered 2021-01-05: 500 mg via ORAL

## 2021-01-05 NOTE — Progress Notes (Signed)
Urological Symptom Review  Patient is experiencing the following symptoms: Frequent urination Blood in urine Weak stream  Review of Systems  Gastrointestinal (upper)  : Negative for upper GI symptoms  Gastrointestinal (lower) : Negative for lower GI symptoms  Constitutional : Negative for symptoms  Skin: Negative for skin symptoms  Eyes: Negative for eye symptoms  Ear/Nose/Throat : Negative for Ear/Nose/Throat symptoms  Hematologic/Lymphatic: Negative for Hematologic/Lymphatic symptoms  Cardiovascular : Negative for cardiovascular symptoms  Respiratory : Negative for respiratory symptoms  Endocrine: Negative for endocrine symptoms  Musculoskeletal: Negative for musculoskeletal symptoms  Neurological: Negative for neurological symptoms  Psychologic: Negative for psychiatric symptoms

## 2021-01-09 ENCOUNTER — Telehealth: Payer: Self-pay | Admitting: Urology

## 2021-01-09 NOTE — Telephone Encounter (Signed)
Pt stopped by and wanted to speak with about getting his surgery scheduled. Please call when you can. Thanks!

## 2021-01-10 ENCOUNTER — Other Ambulatory Visit: Payer: Self-pay | Admitting: Urology

## 2021-01-10 NOTE — Telephone Encounter (Signed)
I returned patient call- patient is having surgery at [facility] with Dr. Jeffie Pollock. Number given to patient for Pam, Dr. Jeffie Pollock OR scheduler in [place] office. Patient voiced understanding.   Surgery faxed to [facility] on 01/05/2021

## 2021-01-13 ENCOUNTER — Encounter (HOSPITAL_COMMUNITY): Payer: Self-pay

## 2021-01-13 ENCOUNTER — Other Ambulatory Visit (HOSPITAL_COMMUNITY)
Admission: RE | Admit: 2021-01-13 | Discharge: 2021-01-13 | Disposition: A | Discharge status: Home / Self Care | Payer: Medicare HMO | Source: Ambulatory Visit | Attending: Urology | Admitting: Urology

## 2021-01-13 DIAGNOSIS — Z01812 Encounter for preprocedural laboratory examination: Secondary | ICD-10-CM | POA: Insufficient documentation

## 2021-01-13 DIAGNOSIS — Z20822 Contact with and (suspected) exposure to covid-19: Secondary | ICD-10-CM | POA: Diagnosis not present

## 2021-01-13 NOTE — Patient Instructions (Addendum)
DUE TO COVID-19 ONLY ONE VISITOR IS ALLOWED TO COME WITH YOU AND STAY IN THE WAITING ROOM ONLY DURING PRE OP AND PROCEDURE DAY OF SURGERY. THE 1 VISITOR  MAY VISIT WITH YOU AFTER SURGERY IN YOUR PRIVATE ROOM DURING VISITING HOURS ONLY!  YOU NEED TO HAVE A COVID 19 TEST ON_3/21______ @__12 :55_____, THIS TEST MUST BE DONE BEFORE SURGERY,  COVID TESTING SITE [place] [address], IT IS ON THE RIGHT GOING OUT [address] APPROXIMATELY  2 MINUTES PAST [employer] ON THE RIGHT. ONCE YOUR COVID TEST IS COMPLETED,  PLEASE BEGIN THE QUARANTINE INSTRUCTIONS AS OUTLINED IN YOUR HANDOUT.                [name].   Your procedure is scheduled on: 01/17/21   Report to [facility] Main  Entrance   Report to admitting at  8:45 AM     Call this number if you have problems the morning of surgery [phone]    Remember: Do not eat food or drink liquids :After Midnight.    BRUSH YOUR TEETH MORNING OF SURGERY AND RINSE YOUR MOUTH OUT, NO CHEWING GUM CANDY OR MINTS.     Take these medicines the morning of surgery with A SIP OF WATER: Metoprolol. Bring your mask and tubing to the hospital                                 You may not have any metal on your body including hair pins and              piercings  Do not wear jewelry,  lotions, powders or deodorant              Men may shave face and neck.   Do not bring valuables to the hospital. [place].  Contacts, dentures or bridgework may not be worn into surgery.      Patients discharged the day of surgery will not be allowed to drive home.   IF YOU ARE HAVING SURGERY AND GOING HOME THE SAME DAY, YOU MUST HAVE AN ADULT TO DRIVE YOU HOME AND BE WITH YOU FOR 24 HOURS. YOU MAY GO HOME BY TAXI OR UBER OR ORTHERWISE, BUT AN ADULT MUST ACCOMPANY YOU HOME AND STAY WITH YOU FOR 24 HOURS.  Name and phone number of your driver:  Special Instructions:  N/A              Please read over the following fact sheets you were given: _____________________________________________________________________             [facility] - Preparing for Surgery Before surgery, you can play an important role.  Because skin is not sterile, your skin needs to be as free of germs as possible.  You can reduce the number of germs on your skin by washing with CHG (chlorahexidine gluconate) soap before surgery.  CHG is an antiseptic cleaner which kills germs and bonds with the skin to continue killing germs even after washing. Please DO NOT use if you have an allergy to CHG or antibacterial soaps.  If your skin becomes reddened/irritated stop using the CHG and inform your nurse when you arrive at Short Stay.  You may shave your face/neck. Please follow these instructions carefully:  1.  Shower with CHG Soap the night before surgery and the  morning of Surgery.  2.  If you choose to wash your hair, wash your hair first as usual with your  normal  shampoo.  3.  After you shampoo, rinse your hair and body thoroughly to remove the  shampoo.                            .           4  Use CHG as you would any other liquid soap.  You can apply chg directly  to the skin and wash                       Gently with a scrungie or clean washcloth.  5.  Apply the CHG Soap to your body ONLY FROM THE NECK DOWN.   Do not use on face/ open                           Wound or open sores. Avoid contact with eyes, ears mouth and genitals (private parts).                       Wash face,  Genitals (private parts) with your normal soap.             6.  Wash thoroughly, paying special attention to the area where your surgery  will be performed.  7.  Thoroughly rinse your body with warm water from the neck down.  8.  DO NOT shower/wash with your normal soap after using and rinsing off  the CHG Soap.             9.  Pat yourself dry with a clean towel.            10.  Wear clean pajamas.             11.  Place clean sheets on your bed the night of your first shower and do not  sleep with pets. Day of Surgery : Do not apply any lotions/deodorants the morning of surgery.  Please wear clean clothes to the hospital/surgery center.  FAILURE TO FOLLOW THESE INSTRUCTIONS MAY RESULT IN THE CANCELLATION OF YOUR SURGERY PATIENT SIGNATURE_________________________________  NURSE SIGNATURE__________________________________  ________________________________________________________________________

## 2021-01-14 LAB — SARS CORONAVIRUS 2 (TAT 6-24 HRS): SARS Coronavirus 2: NEGATIVE

## 2021-01-16 ENCOUNTER — Encounter (HOSPITAL_COMMUNITY)
Admission: RE | Admit: 2021-01-16 | Discharge: 2021-01-16 | Disposition: A | Discharge status: Home / Self Care | Payer: Medicare HMO | Source: Ambulatory Visit | Attending: Urology | Admitting: Urology

## 2021-01-16 ENCOUNTER — Encounter (HOSPITAL_COMMUNITY): Payer: Self-pay

## 2021-01-16 ENCOUNTER — Other Ambulatory Visit (HOSPITAL_COMMUNITY): Payer: Medicare HMO

## 2021-01-16 ENCOUNTER — Other Ambulatory Visit: Payer: Self-pay

## 2021-01-16 DIAGNOSIS — I4510 Unspecified right bundle-branch block: Secondary | ICD-10-CM | POA: Diagnosis not present

## 2021-01-16 DIAGNOSIS — Z01818 Encounter for other preprocedural examination: Secondary | ICD-10-CM | POA: Diagnosis not present

## 2021-01-16 DIAGNOSIS — I440 Atrioventricular block, first degree: Secondary | ICD-10-CM

## 2021-01-16 HISTORY — DX: Prediabetes: R73.03

## 2021-01-16 HISTORY — DX: Atrioventricular block, first degree: I44.0

## 2021-01-16 HISTORY — DX: Unspecified right bundle-branch block: I45.10

## 2021-01-16 HISTORY — DX: Essential (primary) hypertension: I10

## 2021-01-16 HISTORY — DX: Personal history of urinary calculi: Z87.442

## 2021-01-16 HISTORY — DX: Myoneural disorder, unspecified: G70.9

## 2021-01-16 LAB — BASIC METABOLIC PANEL
Anion gap: 10 (ref 5–15)
BUN: 15 mg/dL (ref 8–23)
CO2: 26 mmol/L (ref 22–32)
Calcium: 9.5 mg/dL (ref 8.9–10.3)
Chloride: 101 mmol/L (ref 98–111)
Creatinine, Ser: 0.84 mg/dL (ref 0.61–1.24)
GFR, Estimated: >60 mL/min (ref >60)
Glucose, Bld: 121 mg/dL — ABNORMAL HIGH (ref 70–99)
Potassium: 4.3 mmol/L (ref 3.5–5.1)
Sodium: 137 mmol/L (ref 135–145)

## 2021-01-16 LAB — HEMOGLOBIN A1C
Hgb A1c MFr Bld: 6.5 % — ABNORMAL HIGH (ref 4.8–5.6)
Mean Plasma Glucose: 139.85 mg/dL

## 2021-01-16 LAB — CBC
HCT: 43.6 % (ref 39.0–52.0)
Hemoglobin: 14.1 g/dL (ref 13.0–17.0)
MCH: 29.3 pg (ref 26.0–34.0)
MCHC: 32.3 g/dL (ref 30.0–36.0)
MCV: 90.6 fL (ref 80.0–100.0)
Platelets: 238 10*3/uL (ref 150–400)
RBC: 4.81 MIL/uL (ref 4.22–5.81)
RDW: 12.8 % (ref 11.5–15.5)
WBC: 6.9 10*3/uL (ref 4.0–10.5)
nRBC: 0 % (ref 0.0–0.2)

## 2021-01-16 NOTE — Anesthesia Preprocedure Evaluation (Addendum)
Anesthesia Evaluation  Patient identified by MRN, date of birth, ID band Patient awake    Reviewed: Allergy & Precautions, NPO status , Patient's Chart, lab work & pertinent test results  Airway Mallampati: II  TM Distance: >3 FB Neck ROM: Full    Dental  (+) Chipped, Dental Advisory Given, Missing   Pulmonary sleep apnea , former smoker,    Pulmonary exam normal breath sounds clear to auscultation       Cardiovascular METS: 3 - Mets hypertension, Pt. on home beta blockers and Pt. on medications (-) angina+ CAD  Normal cardiovascular exam Rhythm:Regular Rate:Normal     Neuro/Psych PSYCHIATRIC DISORDERS negative neurological ROS     GI/Hepatic negative GI ROS, Neg liver ROS,   Endo/Other  negative endocrine ROS  Renal/GU negative Renal ROS     Musculoskeletal negative musculoskeletal ROS (+)   Abdominal   Peds  Hematology negative hematology ROS (+)   Anesthesia Other Findings   Reproductive/Obstetrics                         Anesthesia Physical Anesthesia Plan  ASA: III  Anesthesia Plan: General   Post-op Pain Management:    Induction: Intravenous  PONV Risk Score and Plan: 3 and Ondansetron, Dexamethasone, Diphenhydramine and Treatment may vary due to age or medical condition  Airway Management Planned: LMA and Oral ETT  Additional Equipment: None  Intra-op Plan:   Post-operative Plan: Extubation in OR  Informed Consent: I have reviewed the patients History and Physical, chart, labs and discussed the procedure including the risks, benefits and alternatives for the proposed anesthesia with the patient or authorized representative who has indicated his/her understanding and acceptance.     Dental advisory given  Plan Discussed with: CRNA  Anesthesia Plan Comments: (Excellent exercise tolerance per patient. 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  See APP  note by Durel Salts, FNP )    Anesthesia Quick Evaluation

## 2021-01-16 NOTE — Progress Notes (Signed)
Unable to reach pt nor his wife at all numbers listed in regards to surgical time change and arrival time.

## 2021-01-16 NOTE — Progress Notes (Signed)
Anesthesia Chart Review:   Case: [id number] Date/Time: 01/17/21 1103   Procedure: TRANSURETHRAL RESECTION OF BLADDER TUMOR (TURBT) POSSIBLE URETERAL STENT POSSIBLE GEMCITABINE (Bilateral ) - GENERAL ANESTHESIA WITH  PARLYSIS   Anesthesia type: General   Pre-op diagnosis: MULTIFOCAL BLADDER TURMOR   Location: [place] / [facility] ORS   Surgeons: Irine Seal, MD      DISCUSSION: (CAD (s/p stent to RCA 1997), HTN, OSA, pre-diabetes, Tourette syndrome  I do not have any cardiac testing/evaluation for patient since negative stress test in 2003.  Pt did not report active CV symptoms at pre-surgical testing   VS: BP 104/68   Pulse (!) 58   Temp 36.6 C (Oral)   Resp 20   Ht 5\' 10"  (1.778 m)   Wt 97.1 kg   SpO2 100%   BMI 30.71 kg/m   PROVIDERS: - PCP is Celene Squibb, MD   LABS: Labs reviewed: Acceptable for surgery. (all labs ordered are listed, but only abnormal results are displayed)  Labs Reviewed  HEMOGLOBIN A1C - Abnormal; Notable for the following components:      Result Value   Hgb A1c MFr Bld 6.5 (*)    All other components within normal limits  BASIC METABOLIC PANEL - Abnormal; Notable for the following components:   Glucose, Bld 121 (*)    All other components within normal limits  CBC    EKG 01/16/21: Sinus bradycardia with 1st degree A-V block. RBBB.    CV: none recent   Past Medical History:  Diagnosis Date  . Anginal pain ([place]) 1997  . Coronary artery disease   . History of kidney stones   . Hypercholesteremia   . Hypertension   . Neuromuscular disorder ([place])    peripheral neuropathy  . OSA (obstructive sleep apnea) 08/05/2013  . Pre-diabetes   . Sleep apnea   . Tourette syndrome   . Tourette's syndrome 04/01/2013    Past Surgical History:  Procedure Laterality Date  . BACK SURGERY  1983  . CARDIAC CATHETERIZATION  1997  . CATARACT EXTRACTION  11/2012  . heart stent  01/16/1996    MEDICATIONS: . aspirin 81 MG EC tablet  . cetirizine  (ZYRTEC) 10 MG tablet  . Coenzyme Q10 (COQ-10) 100 MG CAPS  . guanFACINE (TENEX) 1 MG tablet  . lisinopril (ZESTRIL) 2.5 MG tablet  . metoprolol succinate (TOPROL-XL) 25 MG 24 hr tablet  . Multiple Vitamin (MULTIVITAMIN WITH MINERALS) TABS tablet  . Phenylephrine HCl (NEO-SYNEPHRINE NA)  . simvastatin (ZOCOR) 20 MG tablet  . vitamin C (ASCORBIC ACID) 500 MG tablet   No current facility-administered medications for this encounter.   Pt will need further eval by assigned anesthesiologist day of surgery.  Willeen Cass, PhD, FNP-BC [facility]/Anesthesiology Phone: [phone] 01/16/2021 4:19 PM

## 2021-01-16 NOTE — Progress Notes (Signed)
COVID Vaccine Completed:Yes Date COVID Vaccine completed:01/10/20-booster 10/12/20 COVID vaccine manufacturer:   Moderna   PCP - Dr. Lenetta Quaker Cardiologist -Dr. Harold Hedge   Chest x-ray - no EKG - 01/16/21-epic Stress Test - 05/19/02-epic ECHO - no Cardiac Cath - with stent 01/16/96-epic Pacemaker/ICD device last checked:NA  Sleep Study - yes CPAP - yes  Fasting Blood Sugar - NA Checks Blood Sugar _____ times a day  Blood Thinner Instructions:ASA 81mg / Dr. Verl Blalock Aspirin Instructions:stop 5 days prior/Dr. Jeffie Pollock Last Dose:01/09/21  Anesthesia review:   Patient denies shortness of breath, fever, cough and chest pain at PAT appointment  yes Patient verbalized understanding of instructions that were given to them at the PAT appointment. Patient was also instructed that they will need to review over the PAT instructions again at home before surgery.yes Pt doesn't climb stairs but reports no SOB. He walks slowly and is a little unsteady. He has tourette"s syndrome from treatments as a child.

## 2021-01-17 ENCOUNTER — Ambulatory Visit (HOSPITAL_COMMUNITY): Payer: Medicare HMO | Admitting: Certified Registered"

## 2021-01-17 ENCOUNTER — Ambulatory Visit (HOSPITAL_COMMUNITY): Payer: Medicare HMO | Admitting: Emergency Medicine

## 2021-01-17 ENCOUNTER — Encounter (HOSPITAL_COMMUNITY): Payer: Self-pay | Admitting: Urology

## 2021-01-17 ENCOUNTER — Encounter (HOSPITAL_COMMUNITY)
Admission: RE | Disposition: A | Discharge status: Home / Self Care | Payer: Self-pay | Source: Ambulatory Visit | Attending: Urology

## 2021-01-17 ENCOUNTER — Observation Stay (HOSPITAL_COMMUNITY)
Admission: RE | Admit: 2021-01-17 | Discharge: 2021-01-18 | Disposition: A | Discharge status: Home / Self Care | Payer: Medicare HMO | Source: Ambulatory Visit | Attending: Urology | Admitting: Urology

## 2021-01-17 DIAGNOSIS — C672 Malignant neoplasm of lateral wall of bladder: Secondary | ICD-10-CM | POA: Diagnosis not present

## 2021-01-17 DIAGNOSIS — D494 Neoplasm of unspecified behavior of bladder: Secondary | ICD-10-CM | POA: Diagnosis not present

## 2021-01-17 DIAGNOSIS — E7800 Pure hypercholesterolemia, unspecified: Secondary | ICD-10-CM | POA: Diagnosis not present

## 2021-01-17 DIAGNOSIS — Z7982 Long term (current) use of aspirin: Secondary | ICD-10-CM | POA: Insufficient documentation

## 2021-01-17 DIAGNOSIS — E119 Type 2 diabetes mellitus without complications: Secondary | ICD-10-CM | POA: Diagnosis not present

## 2021-01-17 DIAGNOSIS — R310 Gross hematuria: Secondary | ICD-10-CM | POA: Diagnosis present

## 2021-01-17 DIAGNOSIS — D090 Carcinoma in situ of bladder: Secondary | ICD-10-CM | POA: Diagnosis not present

## 2021-01-17 DIAGNOSIS — C678 Malignant neoplasm of overlapping sites of bladder: Secondary | ICD-10-CM | POA: Diagnosis not present

## 2021-01-17 DIAGNOSIS — Z79899 Other long term (current) drug therapy: Secondary | ICD-10-CM | POA: Diagnosis not present

## 2021-01-17 DIAGNOSIS — Z87891 Personal history of nicotine dependence: Secondary | ICD-10-CM | POA: Diagnosis not present

## 2021-01-17 DIAGNOSIS — I2510 Atherosclerotic heart disease of native coronary artery without angina pectoris: Secondary | ICD-10-CM | POA: Insufficient documentation

## 2021-01-17 DIAGNOSIS — I10 Essential (primary) hypertension: Secondary | ICD-10-CM | POA: Insufficient documentation

## 2021-01-17 DIAGNOSIS — N3289 Other specified disorders of bladder: Secondary | ICD-10-CM | POA: Diagnosis not present

## 2021-01-17 HISTORY — PX: TRANSURETHRAL RESECTION OF BLADDER TUMOR: SHX2575

## 2021-01-17 SURGERY — TURBT (TRANSURETHRAL RESECTION OF BLADDER TUMOR)
Anesthesia: General | Laterality: Bilateral

## 2021-01-17 MED ORDER — LORATADINE 10 MG PO TABS
10.0000 mg | ORAL_TABLET | Freq: Every day | ORAL | Status: DC
  Administered 2021-01-18: 10 mg via ORAL
  Filled 2021-01-17: qty 1

## 2021-01-17 MED ORDER — EPHEDRINE SULFATE-NACL 50-0.9 MG/10ML-% IV SOSY
PREFILLED_SYRINGE | INTRAVENOUS | Status: DC | PRN
  Administered 2021-01-17: 5 mg via INTRAVENOUS
  Administered 2021-01-17: 10 mg via INTRAVENOUS

## 2021-01-17 MED ORDER — METOPROLOL SUCCINATE ER 25 MG PO TB24
25.0000 mg | ORAL_TABLET | Freq: Two times a day (BID) | ORAL | Status: DC
  Administered 2021-01-17 – 2021-01-18 (×2): 25 mg via ORAL
  Filled 2021-01-17 (×2): qty 1

## 2021-01-17 MED ORDER — LACTATED RINGERS IV SOLN: INTRAVENOUS | Status: DC

## 2021-01-17 MED ORDER — PROPOFOL 10 MG/ML IV BOLUS
INTRAVENOUS | Status: DC | PRN
  Administered 2021-01-17: 100 mg via INTRAVENOUS

## 2021-01-17 MED ORDER — BISACODYL 10 MG RE SUPP: 10.0000 mg | Freq: Every day | RECTAL | Status: DC | PRN

## 2021-01-17 MED ORDER — SIMVASTATIN 10 MG PO TABS
20.0000 mg | ORAL_TABLET | Freq: Every day | ORAL | Status: DC
  Administered 2021-01-17: 20 mg via ORAL
  Filled 2021-01-17: qty 2

## 2021-01-17 MED ORDER — OXYBUTYNIN CHLORIDE 5 MG PO TABS: 5.0000 mg | ORAL_TABLET | Freq: Three times a day (TID) | ORAL | Status: DC | PRN

## 2021-01-17 MED ORDER — POTASSIUM CHLORIDE IN NACL 20-0.45 MEQ/L-% IV SOLN
INTRAVENOUS | Status: DC
  Filled 2021-01-17 (×3): qty 1000

## 2021-01-17 MED ORDER — CHLORHEXIDINE GLUCONATE 0.12 % MT SOLN
15.0000 mL | Freq: Once | OROMUCOSAL | Status: AC
  Administered 2021-01-17: 15 mL via OROMUCOSAL

## 2021-01-17 MED ORDER — COQ-10 100 MG PO CAPS: 100.0000 mg | ORAL_CAPSULE | Freq: Two times a day (BID) | ORAL | Status: DC

## 2021-01-17 MED ORDER — PHENYLEPHRINE HCL 0.25 % NA SOLN: 1.0000 | Freq: Four times a day (QID) | NASAL | Status: DC | PRN

## 2021-01-17 MED ORDER — SENNOSIDES-DOCUSATE SODIUM 8.6-50 MG PO TABS: 1.0000 | ORAL_TABLET | Freq: Every evening | ORAL | Status: DC | PRN

## 2021-01-17 MED ORDER — SODIUM CHLORIDE 0.9 % IR SOLN
Status: DC | PRN
  Administered 2021-01-17: 36000 mL

## 2021-01-17 MED ORDER — FENTANYL CITRATE (PF) 250 MCG/5ML IJ SOLN
INTRAMUSCULAR | Status: DC | PRN
  Administered 2021-01-17 (×2): 25 ug via INTRAVENOUS
  Administered 2021-01-17: 50 ug via INTRAVENOUS

## 2021-01-17 MED ORDER — ONDANSETRON HCL 4 MG/2ML IJ SOLN
INTRAMUSCULAR | Status: AC
  Filled 2021-01-17: qty 2

## 2021-01-17 MED ORDER — SUGAMMADEX SODIUM 200 MG/2ML IV SOLN
INTRAVENOUS | Status: DC | PRN
  Administered 2021-01-17: 200 mg via INTRAVENOUS

## 2021-01-17 MED ORDER — FLEET ENEMA 7-19 GM/118ML RE ENEM: 1.0000 | ENEMA | Freq: Once | RECTAL | Status: DC | PRN

## 2021-01-17 MED ORDER — ONDANSETRON HCL 4 MG/2ML IJ SOLN
INTRAMUSCULAR | Status: DC | PRN
  Administered 2021-01-17: 4 mg via INTRAVENOUS

## 2021-01-17 MED ORDER — CHLORHEXIDINE GLUCONATE CLOTH 2 % EX PADS: 6.0000 | MEDICATED_PAD | Freq: Every day | CUTANEOUS | Status: DC

## 2021-01-17 MED ORDER — GUANFACINE HCL 1 MG PO TABS
2.0000 mg | ORAL_TABLET | Freq: Every day | ORAL | Status: DC
  Administered 2021-01-17: 2 mg via ORAL
  Filled 2021-01-17: qty 2

## 2021-01-17 MED ORDER — PROPOFOL 10 MG/ML IV BOLUS
INTRAVENOUS | Status: AC
  Filled 2021-01-17: qty 20

## 2021-01-17 MED ORDER — ORAL CARE MOUTH RINSE: 15.0000 mL | Freq: Once | OROMUCOSAL | Status: AC

## 2021-01-17 MED ORDER — DEXAMETHASONE SODIUM PHOSPHATE 10 MG/ML IJ SOLN
INTRAMUSCULAR | Status: AC
  Filled 2021-01-17: qty 1

## 2021-01-17 MED ORDER — OXYCODONE HCL 5 MG PO TABS: 5.0000 mg | ORAL_TABLET | ORAL | Status: DC | PRN

## 2021-01-17 MED ORDER — GLYCOPYRROLATE 0.2 MG/ML IJ SOLN
INTRAMUSCULAR | Status: DC | PRN
  Administered 2021-01-17: .2 mg via INTRAVENOUS

## 2021-01-17 MED ORDER — DEXAMETHASONE SODIUM PHOSPHATE 10 MG/ML IJ SOLN
INTRAMUSCULAR | Status: DC | PRN
  Administered 2021-01-17: 4 mg via INTRAVENOUS

## 2021-01-17 MED ORDER — ZOLPIDEM TARTRATE 5 MG PO TABS: 5.0000 mg | ORAL_TABLET | Freq: Every evening | ORAL | Status: DC | PRN

## 2021-01-17 MED ORDER — FENTANYL CITRATE (PF) 100 MCG/2ML IJ SOLN
25.0000 ug | INTRAMUSCULAR | Status: DC | PRN
  Administered 2021-01-17: 50 ug via INTRAVENOUS

## 2021-01-17 MED ORDER — FENTANYL CITRATE (PF) 100 MCG/2ML IJ SOLN
INTRAMUSCULAR | Status: AC
  Filled 2021-01-17: qty 2

## 2021-01-17 MED ORDER — HYDROCODONE-ACETAMINOPHEN 5-325 MG PO TABS: 1.0000 | ORAL_TABLET | Freq: Four times a day (QID) | ORAL | 0 refills | Status: DC | PRN

## 2021-01-17 MED ORDER — FENTANYL CITRATE (PF) 100 MCG/2ML IJ SOLN
INTRAMUSCULAR | Status: AC
  Administered 2021-01-17: 50 ug via INTRAVENOUS
  Filled 2021-01-17: qty 2

## 2021-01-17 MED ORDER — AMISULPRIDE (ANTIEMETIC) 5 MG/2ML IV SOLN: 10.0000 mg | Freq: Once | INTRAVENOUS | Status: DC | PRN

## 2021-01-17 MED ORDER — LIDOCAINE 2% (20 MG/ML) 5 ML SYRINGE
INTRAMUSCULAR | Status: AC
  Filled 2021-01-17: qty 5

## 2021-01-17 MED ORDER — HYDROMORPHONE HCL 1 MG/ML IJ SOLN: 0.5000 mg | INTRAMUSCULAR | Status: DC | PRN

## 2021-01-17 MED ORDER — ROCURONIUM BROMIDE 10 MG/ML (PF) SYRINGE
PREFILLED_SYRINGE | INTRAVENOUS | Status: DC | PRN
  Administered 2021-01-17: 50 mg via INTRAVENOUS
  Administered 2021-01-17: 10 mg via INTRAVENOUS
  Administered 2021-01-17 (×2): 20 mg via INTRAVENOUS

## 2021-01-17 MED ORDER — CEFAZOLIN SODIUM-DEXTROSE 2-4 GM/100ML-% IV SOLN
2.0000 g | INTRAVENOUS | Status: AC
  Administered 2021-01-17: 2 g via INTRAVENOUS
  Filled 2021-01-17: qty 100

## 2021-01-17 MED ORDER — LIDOCAINE 2% (20 MG/ML) 5 ML SYRINGE
INTRAMUSCULAR | Status: DC | PRN
  Administered 2021-01-17: 80 mg via INTRAVENOUS

## 2021-01-17 MED ORDER — 0.9 % SODIUM CHLORIDE (POUR BTL) OPTIME
TOPICAL | Status: DC | PRN
  Administered 2021-01-17: 1000 mL

## 2021-01-17 MED ORDER — LISINOPRIL 5 MG PO TABS
2.5000 mg | ORAL_TABLET | Freq: Every day | ORAL | Status: DC
  Administered 2021-01-18: 2.5 mg via ORAL
  Filled 2021-01-17: qty 1

## 2021-01-17 MED ORDER — ACETAMINOPHEN 325 MG PO TABS: 650.0000 mg | ORAL_TABLET | ORAL | Status: DC | PRN

## 2021-01-17 MED ORDER — ONDANSETRON HCL 4 MG/2ML IJ SOLN: 4.0000 mg | INTRAMUSCULAR | Status: DC | PRN

## 2021-01-17 SURGICAL SUPPLY — 21 items
BAG DRN RND TRDRP ANRFLXCHMBR (UROLOGICAL SUPPLIES)
BAG URINE DRAIN 2000ML AR STRL (UROLOGICAL SUPPLIES) IMPLANT
BAG URO CATCHER STRL LF (MISCELLANEOUS) ×2 IMPLANT
CATH FOLEY 2WAY SLVR  5CC 22FR (CATHETERS) ×2
CATH FOLEY 2WAY SLVR 5CC 22FR (CATHETERS) IMPLANT
CATH FOLEY 3WAY 30CC 22FR (CATHETERS) IMPLANT
CATH URET 5FR 28IN OPEN ENDED (CATHETERS) IMPLANT
DRAPE FOOT SWITCH (DRAPES) ×2 IMPLANT
GLOVE SURG POLYISO LF SZ8 (GLOVE) IMPLANT
GOWN STRL REUS W/TWL XL LVL3 (GOWN DISPOSABLE) ×2 IMPLANT
HOLDER FOLEY CATH W/STRAP (MISCELLANEOUS) IMPLANT
KIT TURNOVER KIT A (KITS) ×2 IMPLANT
LOOP CUT BIPOLAR 24F LRG (ELECTROSURGICAL) ×1 IMPLANT
MANIFOLD NEPTUNE II (INSTRUMENTS) ×2 IMPLANT
PACK CYSTO (CUSTOM PROCEDURE TRAY) ×2 IMPLANT
SUT ETHILON 3 0 PS 1 (SUTURE) IMPLANT
SYR 30ML LL (SYRINGE) IMPLANT
SYR TOOMEY IRRIG 70ML (MISCELLANEOUS)
SYRINGE TOOMEY IRRIG 70ML (MISCELLANEOUS) IMPLANT
TUBING CONNECTING 10 (TUBING) ×2 IMPLANT
TUBING UROLOGY SET (TUBING) ×2 IMPLANT

## 2021-01-17 NOTE — Progress Notes (Signed)
Pt declined nocturnal cpap tonight.  Pt was advised that RT is available all night should he change his mind.

## 2021-01-17 NOTE — Interval H&P Note (Signed)
History and Physical Interval Note:  01/17/2021 10:13 AM  [name].  has presented today for surgery, with the diagnosis of [place].  The various methods of treatment have been discussed with the patient and family. After consideration of risks, benefits and other options for treatment, the patient has consented to  Procedure(s) with comments: TRANSURETHRAL RESECTION OF BLADDER TUMOR (TURBT) POSSIBLE URETERAL STENT POSSIBLE GEMCITABINE (Bilateral) - GENERAL ANESTHESIA WITH  PARLYSIS as a surgical intervention.  The patient's history has been reviewed, patient examined, no change in status, stable for surgery.  I have reviewed the patient's chart and labs.  Questions were answered to the patient's satisfaction.     Irine Seal

## 2021-01-17 NOTE — Discharge Instructions (Addendum)
Transurethral Resection of Bladder Tumor, Care After This sheet gives you information about how to care for yourself after your procedure. Your health care provider may also give you more specific instructions. If you have problems or questions, contact your health care provider. What can I expect after the procedure? After the procedure, it is common to have:  A small amount of blood in your urine for up to 2 weeks.  Soreness or mild pain from your catheter. After your catheter is removed, you may have mild soreness, especially when urinating.  Pain in your lower abdomen. Follow these instructions at home: Medicines  Take over-the-counter and prescription medicines only as told by your health care provider.  If you were prescribed an antibiotic medicine, take it as told by your health care provider. Do not stop taking the antibiotic even if you start to feel better.  Do not drive for 24 hours if you were given a sedative during your procedure.  Ask your health care provider if the medicine prescribed to you: ? Requires you to avoid driving or using heavy machinery. ? Can cause constipation. You may need to take these actions to prevent or treat constipation:  Take over-the-counter or prescription medicines.  Eat foods that are high in fiber, such as beans, whole grains, and fresh fruits and vegetables.  Limit foods that are high in fat and processed sugars, such as fried or sweet foods.   Activity  Return to your normal activities as told by your health care provider. Ask your health care provider what activities are safe for you.  Do not lift anything that is heavier than 10 lb (4.5 kg), or the limit that you are told, until your health care provider says that it is safe.  Avoid intense physical activity for as long as told by your health care provider.  Rest as told by your health care provider.  Avoid sitting for a long time without moving. Get up to take short walks every  1-2 hours. This is important to improve blood flow and breathing. Ask for help if you feel weak or unsteady. General instructions  Do not drink alcohol for as long as told by your health care provider. This is especially important if you are taking prescription pain medicines.  Do not take baths, swim, or use a hot tub until your health care provider approves. Ask your health care provider if you may take showers. You may only be allowed to take sponge baths.  If you have a catheter, follow instructions from your health care provider about caring for your catheter and your drainage bag.  Drink enough fluid to keep your urine pale yellow.  Wear compression stockings as told by your health care provider. These stockings help to prevent blood clots and reduce swelling in your legs.  Keep all follow-up visits as told by your health care provider. This is important. ? You will need to be followed closely with regular checks of your bladder and urethra (cystoscopies) to make sure that the cancer does not come back.   Contact a health care provider if:  You have pain that gets worse or does not improve with medicine.  You have blood in your urine for more than 2 weeks.  You have cloudy or bad-smelling urine.  You become constipated. Signs of constipation may include having: ? Fewer than three bowel movements in a week. ? Difficulty having a bowel movement. ? Stools that are dry, hard, or larger than normal.  You have a fever. Get help right away if:  You have: ? Severe pain. ? Bright red blood in your urine. ? Blood clots in your urine. ? A lot of blood in your urine.  Your catheter has been removed and you are not able to urinate.  You have a catheter in place and the catheter is not draining urine. Summary  After your procedure, it is common to have a small amount of blood in your urine, soreness or mild pain from your catheter, and pain in your lower abdomen.  Take  over-the-counter and prescription medicines only as told by your health care provider.  Rest as told by your health care provider. Follow your health care provider's instructions about returning to normal activities. Ask what activities are safe for you.  If you have a catheter, follow instructions from your health care provider about caring for your catheter and your drainage bag.  Get help right away if you cannot urinate, you have severe pain, or you have bright red blood or blood clots in your urine.  Please continue to hold the ASA for another week.  This information is not intended to replace advice given to you by your health care provider. Make sure you discuss any questions you have with your health care provider. Document Revised: 05/15/2018 Document Reviewed: 05/15/2018 Elsevier Patient Education  [place].

## 2021-01-17 NOTE — Op Note (Addendum)
Procedure: Cystoscopy with first stage transurethral resection of large bladder tumor from overlapping sites.  Preop diagnosis: Multifocal bladder cancer.  Postop diagnosis: Same with possible involvement of the left ureteral orifice.  Surgeon: Dr. Irine Seal.  Anesthesia: General.  Specimen: 1.  Bladder tumor chips. 2.  Right lateral wall tumor.  Drains: 64 French Foley catheter.  EBL: 100 mL.  Complications: None.  Indications: The patient is an 80 year old male who was seen for hematuria and on CT scan he was noted to have multiple bladder lesions worrisome for neoplasm.  This was confirmed by office cystoscopy.  It was felt there was possible involvement of left ureteral orifice.  Procedure: He was taken operating room where general anesthetic was induced.  He was given Ancef.  He was placed in lithotomy position and fitted with PAS hose.  His perineum and genitalia were prepped with Betadine solution he was draped in usual sterile fashion.  The urethra was calibrated to 30 [place] with [employer] sounds and the 26 French continuous-flow resectoscope sheath was placed with the aid of the visual obturator.  Inspection demonstrated a normal urethra.  The external sphincter was intact.  The prostatic urethra had bilobar hyperplasia without significant coaptation, but there was a papillary neoplasm involving the left and anterior bladder neck and proximal prostatic urethra.  Examination of bladder revealed multifocal urothelial carcinoma involving the left trigone, the left lateral wall, the anterior wall, right posterior and mid posterior wall and dome.  The right trigone and ureteral orifice were spared.  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  The contiguous tumor mass was well in excess of 5 cm in greatest diameter.    The  visual obturator was replaced with the [facility] handle with a bipolar loop and 30 degree lens.  Saline was used as the irrigant.  I initially resected the tumor from the proximal prostatic urethra and bladder neck.  This was followed by resection of the tumor from the left trigone.  The left ureteral orifice was unroofed and there was a suggestion of a few papillary fronds within the distal ureter however on later inspection that was not clearly the case but could not be ruled out.  I then resected extensive anterior wall tumor.  The specimen was retrieved throughout the procedure to aid visualization.  There was a very dense tumor approximately 2 cm in diameter on the right lateral wall with some associated dystrophic calcification there was resected and retrieved and sent as a separate specimen.  Further resection was performed to try to debulk as much tumor as possible and fulgurate as much of the abnormal mucosa as possible, but I was unable to resect the entire tumor burden with a few lesions remaining on the left anterior wall and I am.  The bladder was evacuated free of the residual tissue and final hemostasis was achieved.  Once no active bleeding remained, the scope was removed and a 22 [place] Foley catheter was inserted.  The balloon was filled with 10 mL of sterile fluid.  Initial irrigation returned bloody so the catheter was removed and the scope was replaced and a couple of additional bleeders on the dome and some of the residual tumor were identified and fulgurated.  The scope was then removed again and the catheter was replaced.  This time the irrigant returned clear.  The cath was placed to straight drainage and he was taken down from lithotomy position.  His anesthetic was reversed and he was moved recovery in a stable condition.  There were no complications.  I did not feel that gemcitabine was appropriate due to significant residual tumor burden.

## 2021-01-17 NOTE — Anesthesia Procedure Notes (Signed)
Procedure Name: Intubation Date/Time: 01/17/2021 10:35 AM Performed by: Eben Burow, CRNA Pre-anesthesia Checklist: Patient identified, Emergency Drugs available, Suction available, Patient being monitored and Timeout performed Patient Re-evaluated:Patient Re-evaluated prior to induction Oxygen Delivery Method: Circle system utilized Preoxygenation: Pre-oxygenation with 100% oxygen Induction Type: IV induction Ventilation: Mask ventilation without difficulty Laryngoscope Size: Mac and 4 Grade View: Grade I Tube type: Oral Tube size: 7.5 mm Number of attempts: 1 Airway Equipment and Method: Stylet Placement Confirmation: ETT inserted through vocal cords under direct vision,  positive ETCO2 and breath sounds checked- equal and bilateral Secured at: 23 cm Tube secured with: Tape Dental Injury: Teeth and Oropharynx as per pre-operative assessment

## 2021-01-17 NOTE — Transfer of Care (Signed)
Immediate Anesthesia Transfer of Care Note  Patient: [name].  Procedure(s) Performed: TRANSURETHRAL RESECTION OF BLADDER TUMOR (TURBT) (Bilateral )  Patient Location: PACU  Anesthesia Type:General  Level of Consciousness: awake, alert  and patient cooperative  Airway & Oxygen Therapy: Patient Spontanous Breathing and Patient connected to face mask oxygen  Post-op Assessment: Report given to RN and Post -op Vital signs reviewed and stable  Post vital signs: Reviewed and stable  Last Vitals:  Vitals Value Taken Time  BP 131/114 01/17/21 1222  Temp    Pulse    Resp    SpO2    Vitals shown include unvalidated device data.  Last Pain:  Vitals:   01/17/21 0935  TempSrc:   PainSc: 0-No pain         Complications: No complications documented.

## 2021-01-18 ENCOUNTER — Encounter (HOSPITAL_COMMUNITY): Payer: Self-pay | Admitting: Urology

## 2021-01-18 DIAGNOSIS — R310 Gross hematuria: Secondary | ICD-10-CM | POA: Diagnosis not present

## 2021-01-18 DIAGNOSIS — E119 Type 2 diabetes mellitus without complications: Secondary | ICD-10-CM | POA: Diagnosis not present

## 2021-01-18 DIAGNOSIS — Z79899 Other long term (current) drug therapy: Secondary | ICD-10-CM | POA: Diagnosis not present

## 2021-01-18 DIAGNOSIS — Z7982 Long term (current) use of aspirin: Secondary | ICD-10-CM | POA: Diagnosis not present

## 2021-01-18 DIAGNOSIS — I10 Essential (primary) hypertension: Secondary | ICD-10-CM | POA: Diagnosis not present

## 2021-01-18 DIAGNOSIS — C672 Malignant neoplasm of lateral wall of bladder: Secondary | ICD-10-CM | POA: Diagnosis not present

## 2021-01-18 DIAGNOSIS — Z87891 Personal history of nicotine dependence: Secondary | ICD-10-CM | POA: Diagnosis not present

## 2021-01-18 DIAGNOSIS — I2510 Atherosclerotic heart disease of native coronary artery without angina pectoris: Secondary | ICD-10-CM | POA: Diagnosis not present

## 2021-01-18 DIAGNOSIS — C678 Malignant neoplasm of overlapping sites of bladder: Secondary | ICD-10-CM | POA: Diagnosis not present

## 2021-01-18 LAB — SURGICAL PATHOLOGY

## 2021-01-18 NOTE — Discharge Summary (Signed)
Physician Discharge Summary  Patient ID: [name]. MRN: [id number] DOB/AGE: 1941-03-03 80 y.o.  Admit date: 01/17/2021 Discharge date: 01/18/2021  Admission Diagnoses:  Malignant neoplasm of overlapping sites of bladder [facility])  Discharge Diagnoses:  Principal Problem:   Malignant neoplasm of overlapping sites of bladder [facility])   Past Medical History:  Diagnosis Date  . Anginal pain ([place]) 1997  . Coronary artery disease   . History of kidney stones   . Hypercholesteremia   . Hypertension   . Neuromuscular disorder ([place])    peripheral neuropathy  . OSA (obstructive sleep apnea) 08/05/2013  . Pre-diabetes   . Sleep apnea   . Tourette syndrome   . Tourette's syndrome 04/01/2013    Surgeries: Procedure(s): TRANSURETHRAL RESECTION OF BLADDER TUMOR (TURBT) on 01/17/2021   Consultants (if any):   Discharged Condition: Improved  Hospital Course: Alex Shellhammer. is an 80 y.o. male who was admitted 01/17/2021 with a diagnosis of Malignant neoplasm of overlapping sites of bladder [facility]) and went to the operating room on 01/17/2021 and underwent the above named procedures.  He had very extensive bladder cancer and a full resection couldn't be completed.  He is doing well with the foley and the urine is clear today.  He will be discharged home with the foley and will return Friday for removal.     He was given perioperative antibiotics:  Anti-infectives (From admission, onward)   Start     Dose/Rate Route Frequency Ordered Stop   01/17/21 0913  ceFAZolin (ANCEF) IVPB 2g/100 mL premix        2 g 200 mL/hr over 30 Minutes Intravenous 30 min pre-op 01/17/21 0913 01/17/21 1036    .  He was given sequential compression devices for DVT prophylaxis.  He benefited maximally from the hospital stay and there were no complications.    Recent vital signs:  Vitals:   01/18/21 0012 01/18/21 0316  BP: 136/71 139/70  Pulse: (!) 48 (!) 50  Resp: 18 17  Temp: 98.1 F (36.7 C) 98 F  (36.7 C)  SpO2: 96% 97%    Recent laboratory studies:  Lab Results  Component Value Date   HGB 14.1 01/16/2021   Lab Results  Component Value Date   WBC 6.9 01/16/2021   PLT 238 01/16/2021   No results found for: INR Lab Results  Component Value Date   NA 137 01/16/2021   K 4.3 01/16/2021   CL 101 01/16/2021   CO2 26 01/16/2021   BUN 15 01/16/2021   CREATININE 0.84 01/16/2021   GLUCOSE 121 (H) 01/16/2021    Discharge Medications:   Allergies as of 01/18/2021   No Known Allergies     Medication List    TAKE these medications   aspirin 81 MG EC tablet Take 81 mg by mouth daily. Swallow whole.   cetirizine 10 MG tablet Commonly known as: ZYRTEC Take 10 mg by mouth at bedtime.   CoQ-10 100 MG Caps Take 100 mg by mouth 2 (two) times daily.   guanFACINE 1 MG tablet Commonly known as: TENEX TAKE 2 TABLETS BY MOUTH AT BEDTIME   HYDROcodone-acetaminophen 5-325 MG tablet Commonly known as: NORCO/VICODIN Take 1 tablet by mouth every 6 (six) hours as needed for moderate pain.   lisinopril 2.5 MG tablet Commonly known as: ZESTRIL Take 2.5 mg by mouth daily.   metoprolol succinate 25 MG 24 hr tablet Commonly known as: TOPROL-XL Take 25 mg by mouth 2 (two) times daily.  multivitamin with minerals Tabs tablet Take 1 tablet by mouth daily.   NEO-SYNEPHRINE NA Place 1 spray into the nose at bedtime as needed (congestion).   simvastatin 20 MG tablet Commonly known as: ZOCOR Take 20 mg by mouth at bedtime.   vitamin C 500 MG tablet Commonly known as: ASCORBIC ACID Take 500 mg by mouth daily.       Diagnostic Studies: CT HEMATURIA WORKUP  Result Date: 01/03/2021 CLINICAL DATA:  Hematuria with increasing urinary frequency for 3 weeks. History of kidney stones. EXAM: CT ABDOMEN AND PELVIS WITHOUT AND WITH CONTRAST TECHNIQUE: Multidetector CT imaging of the abdomen and pelvis was performed following the standard protocol before and following the bolus  administration of intravenous contrast. CONTRAST:  169mL OMNIPAQUE IOHEXOL 300 MG/ML  SOLN COMPARISON:  CT 05/20/2007.  Radiographs 06/03/2008. FINDINGS: Lower chest: Mild scarring and traction bronchiectasis at both lung bases. There is a stable calcified right lower lobe granuloma. There is at least moderate atherosclerosis of the aorta and coronary arteries, and probable aortic valvular calcifications are present. Hepatobiliary: The liver is normal in density without suspicious focal abnormality. No evidence of gallstones, gallbladder wall thickening or biliary dilatation. Pancreas: Unremarkable. No pancreatic ductal dilatation or surrounding inflammatory changes. Spleen: Normal in size without focal abnormality. Adrenals/Urinary Tract: The adrenal glands appear stable without significant findings. Pre contrast images demonstrate several nonobstructing bilateral renal calculi. There is no evidence of ureteral calculus or discrete bladder calculus. Post-contrast, both kidneys enhance normally. There is no evidence of enhancing renal mass. There are bilateral renal cysts, largest in the lower pole of the right kidney, measuring up to 5.9 cm in diameter. Delayed images result in segmental visualization of the ureters. No focal upper tract urothelial abnormalities are identified. The bladder is grossly abnormal with multiple irregular, partially calcified enhancing bladder masses. Given the irregular shape of these masses, they are difficult to measure. There is an inferior component measuring 3.3 x 1.7 cm on image 82/7. The masses are nearly circumferential, relatively sparing the posterior wall. No evidence resulting ureteral obstruction. Stomach/Bowel: The stomach appears unremarkable for its degree of distension. No evidence of bowel wall thickening, distention or surrounding inflammatory change. The appendix appears normal. Vascular/Lymphatic: There are no enlarged abdominal or pelvic lymph nodes. There is  diffuse aortic and branch vessel atherosclerosis with tortuosity of the distal aorta and iliac arteries. The distal aorta is dilated to 3.7 cm on image 52/7. No evidence of large vessel occlusion. No significant venous abnormalities. Reproductive: Moderate enlargement of the prostate gland without focal abnormality. Other: No evidence of abdominal wall mass or hernia. No ascites. Musculoskeletal: No acute or significant osseous findings. Multilevel thoracolumbar spondylosis associated with a convex left scoliosis. Moderate degenerative changes of both hips. IMPRESSION: 1. Grossly abnormal appearance of the bladder with multiple irregular, partially calcified enhancing bladder masses, most consistent with multifocal transitional cell carcinoma. Cystoscopy recommended. 2. No evidence of metastatic disease. 3. Nonobstructing bilateral renal calculi. No evidence of ureteral calculus or obstruction. Bilateral renal cysts. 4. Aortic Atherosclerosis (ICD10-I70.0). 3.7 cm abdominal aortic aneurysm. Recommend follow-up ultrasound every 2 years. This recommendation follows ACR consensus guidelines: White Paper of the ACR Incidental Findings Committee II on Vascular Findings. J Am Coll Radiol 2013; 10:789-794. 5. These results will be called to the ordering clinician or representative by the Radiologist Assistant, and communication documented in the PACS or [employer]. Electronically Signed   By: Richardean Sale M.D.   On: 01/03/2021 13:51    Disposition: Discharge disposition:  01-Home or Self Care       Discharge Instructions    Discontinue IV   Complete by: As directed        Follow-up Information    Irine Seal, MD On 02/02/2021.   Specialty: Urology Why: [id number] Contact information: [place] [address] [phone]        [facility] [place] Follow up on 01/20/2021.   Why: The office will call to arrange f/u on Friday or Monday morning for foley removal.   Contact information: [place] [address] [phone]               Signed: Irine Seal 01/18/2021, 8:10 AM

## 2021-01-19 NOTE — Anesthesia Postprocedure Evaluation (Signed)
Anesthesia Post Note  Patient: [name].  Procedure(s) Performed: TRANSURETHRAL RESECTION OF BLADDER TUMOR (TURBT) (Bilateral )     Patient location during evaluation: PACU Anesthesia Type: General Level of consciousness: sedated and patient cooperative Pain management: pain level controlled Vital Signs Assessment: post-procedure vital signs reviewed and stable Respiratory status: spontaneous breathing Cardiovascular status: stable Anesthetic complications: no   No complications documented.  Last Vitals:  Vitals:   01/18/21 0316 01/18/21 0857  BP: 139/70 (!) 107/56  Pulse: (!) 50 (!) 56  Resp: 17 16  Temp: 36.7 C 36.8 C  SpO2: 97%     Last Pain:  Vitals:   01/18/21 0857  TempSrc: Oral  PainSc:                  [name]

## 2021-01-20 DIAGNOSIS — G4733 Obstructive sleep apnea (adult) (pediatric): Secondary | ICD-10-CM | POA: Diagnosis not present

## 2021-01-23 ENCOUNTER — Other Ambulatory Visit: Payer: Self-pay

## 2021-01-23 ENCOUNTER — Ambulatory Visit: Payer: Medicare HMO

## 2021-01-23 ENCOUNTER — Ambulatory Visit (INDEPENDENT_AMBULATORY_CARE_PROVIDER_SITE_OTHER): Payer: Medicare HMO

## 2021-01-23 ENCOUNTER — Other Ambulatory Visit: Payer: Self-pay | Admitting: Urology

## 2021-01-23 DIAGNOSIS — C678 Malignant neoplasm of overlapping sites of bladder: Secondary | ICD-10-CM | POA: Diagnosis not present

## 2021-01-23 NOTE — Progress Notes (Signed)
Fill and Pull Catheter Removal  Patient is present today for a catheter removal.  Patient was cleaned and prepped in a sterile fashion 128ml of sterile water/ saline was instilled into the bladder when the patient felt the urge to urinate. 27ml of water was then drained from the balloon.  A 22FR foley cath was removed from the bladder no complications were noted .  Patient was then given some time to void on their own.  Patient can void 179ml on their own after some time.  Patient tolerated well.  Performed by: Xavion Muscat, lpn  Follow up/ Additional notes: Keep next scheduled OV

## 2021-01-25 DIAGNOSIS — G4730 Sleep apnea, unspecified: Secondary | ICD-10-CM | POA: Diagnosis not present

## 2021-01-25 DIAGNOSIS — R809 Proteinuria, unspecified: Secondary | ICD-10-CM | POA: Diagnosis not present

## 2021-01-25 DIAGNOSIS — R69 Illness, unspecified: Secondary | ICD-10-CM | POA: Diagnosis not present

## 2021-01-25 DIAGNOSIS — I10 Essential (primary) hypertension: Secondary | ICD-10-CM | POA: Diagnosis not present

## 2021-01-25 DIAGNOSIS — E782 Mixed hyperlipidemia: Secondary | ICD-10-CM | POA: Diagnosis not present

## 2021-01-25 DIAGNOSIS — N529 Male erectile dysfunction, unspecified: Secondary | ICD-10-CM | POA: Diagnosis not present

## 2021-01-25 DIAGNOSIS — G9009 Other idiopathic peripheral autonomic neuropathy: Secondary | ICD-10-CM | POA: Diagnosis not present

## 2021-01-25 DIAGNOSIS — E1169 Type 2 diabetes mellitus with other specified complication: Secondary | ICD-10-CM | POA: Diagnosis not present

## 2021-01-25 DIAGNOSIS — I2510 Atherosclerotic heart disease of native coronary artery without angina pectoris: Secondary | ICD-10-CM | POA: Diagnosis not present

## 2021-01-26 ENCOUNTER — Other Ambulatory Visit: Payer: Self-pay

## 2021-01-26 ENCOUNTER — Encounter (HOSPITAL_COMMUNITY): Payer: Self-pay | Admitting: Urology

## 2021-01-26 NOTE — Progress Notes (Signed)
COVID Vaccine Completed: Yes Date COVID Vaccine completed: 01/10/20 Has received booster: Yes COVID vaccine manufacturer:  Moderna    Date of COVID positive in last 90 days: No  PCP - Celene Squibb, MD Cardiologist - N/A  Chest x-ray - N/A EKG - 01/16/21 in epic Stress Test - N/A ECHO - N/A Cardiac Cath -greater than 2 years  Pacemaker/ICD device last checked:N/A  Sleep Study - 06/10/2013 in epic CPAP - Yes  Fasting Blood Sugar - 90-140 Checks Blood Sugar ___as needed__   Blood Thinner Instructions:N/A Aspirin Instructions: N/A Last Dose:N/A  Activity level:  Can go up a flight of stairs and activities of daily living without stopping and without symptoms     Anesthesia review: OSA, CAD, HTN  Patient denies shortness of breath, fever, cough and chest pain at PAT appointment   Patient verbalized understanding of instructions that were given to them at the PAT appointment. Patient was also instructed that they will need to review over the PAT instructions again at home before surgery.

## 2021-01-30 ENCOUNTER — Telehealth: Payer: Self-pay | Admitting: Neurology

## 2021-01-30 DIAGNOSIS — F952 Tourette's disorder: Secondary | ICD-10-CM

## 2021-01-30 MED ORDER — GUANFACINE HCL 1 MG PO TABS: 2.0000 mg | ORAL_TABLET | Freq: Every day | ORAL | 0 refills | Status: AC

## 2021-01-30 NOTE — Addendum Note (Signed)
Addended by: Verlin Grills on: 01/30/2021 03:17 PM   Modules accepted: Orders

## 2021-01-30 NOTE — Telephone Encounter (Signed)
90 day rx has been sent. Pt will have to make f/u for further refills.

## 2021-01-30 NOTE — Telephone Encounter (Signed)
Pt wanted to let us know that he is having bladder surgery this coming Friday. He cancelled his 1 yr f/u and will call us after recovering to r/s. He would like his prescriptions from [place] to be refilled in the meantime.

## 2021-02-01 ENCOUNTER — Ambulatory Visit: Payer: Medicare HMO | Admitting: Neurology

## 2021-02-01 ENCOUNTER — Other Ambulatory Visit (HOSPITAL_COMMUNITY)
Admission: RE | Admit: 2021-02-01 | Discharge: 2021-02-01 | Disposition: A | Discharge status: Home / Self Care | Payer: Medicare HMO | Source: Ambulatory Visit | Attending: Urology | Admitting: Urology

## 2021-02-01 DIAGNOSIS — Z20822 Contact with and (suspected) exposure to covid-19: Secondary | ICD-10-CM | POA: Insufficient documentation

## 2021-02-01 DIAGNOSIS — Z01812 Encounter for preprocedural laboratory examination: Secondary | ICD-10-CM | POA: Insufficient documentation

## 2021-02-01 LAB — SARS CORONAVIRUS 2 (TAT 6-24 HRS): SARS Coronavirus 2: NEGATIVE

## 2021-02-02 ENCOUNTER — Ambulatory Visit: Payer: Medicare HMO | Admitting: Urology

## 2021-02-03 ENCOUNTER — Other Ambulatory Visit: Payer: Self-pay

## 2021-02-03 ENCOUNTER — Encounter (HOSPITAL_COMMUNITY): Payer: Self-pay | Admitting: Urology

## 2021-02-03 ENCOUNTER — Ambulatory Visit (HOSPITAL_COMMUNITY): Payer: Medicare HMO | Admitting: Registered Nurse

## 2021-02-03 ENCOUNTER — Observation Stay (HOSPITAL_COMMUNITY)
Admission: RE | Admit: 2021-02-03 | Discharge: 2021-02-05 | Disposition: A | Discharge status: Home / Self Care | Payer: Medicare HMO | Source: Ambulatory Visit | Attending: Urology | Admitting: Urology

## 2021-02-03 ENCOUNTER — Encounter (HOSPITAL_COMMUNITY)
Admission: RE | Disposition: A | Discharge status: Home / Self Care | Payer: Self-pay | Source: Ambulatory Visit | Attending: Urology

## 2021-02-03 DIAGNOSIS — Z7982 Long term (current) use of aspirin: Secondary | ICD-10-CM | POA: Diagnosis not present

## 2021-02-03 DIAGNOSIS — R310 Gross hematuria: Secondary | ICD-10-CM

## 2021-02-03 DIAGNOSIS — C679 Malignant neoplasm of bladder, unspecified: Principal | ICD-10-CM | POA: Insufficient documentation

## 2021-02-03 DIAGNOSIS — Z87891 Personal history of nicotine dependence: Secondary | ICD-10-CM | POA: Insufficient documentation

## 2021-02-03 DIAGNOSIS — Z7984 Long term (current) use of oral hypoglycemic drugs: Secondary | ICD-10-CM | POA: Insufficient documentation

## 2021-02-03 DIAGNOSIS — I10 Essential (primary) hypertension: Secondary | ICD-10-CM | POA: Diagnosis not present

## 2021-02-03 DIAGNOSIS — C678 Malignant neoplasm of overlapping sites of bladder: Secondary | ICD-10-CM | POA: Diagnosis not present

## 2021-02-03 DIAGNOSIS — E7800 Pure hypercholesterolemia, unspecified: Secondary | ICD-10-CM | POA: Diagnosis not present

## 2021-02-03 DIAGNOSIS — Z79899 Other long term (current) drug therapy: Secondary | ICD-10-CM | POA: Insufficient documentation

## 2021-02-03 DIAGNOSIS — I2510 Atherosclerotic heart disease of native coronary artery without angina pectoris: Secondary | ICD-10-CM | POA: Insufficient documentation

## 2021-02-03 DIAGNOSIS — G4733 Obstructive sleep apnea (adult) (pediatric): Secondary | ICD-10-CM | POA: Diagnosis not present

## 2021-02-03 DIAGNOSIS — E119 Type 2 diabetes mellitus without complications: Secondary | ICD-10-CM | POA: Diagnosis not present

## 2021-02-03 HISTORY — PX: TRANSURETHRAL RESECTION OF BLADDER TUMOR: SHX2575

## 2021-02-03 SURGERY — TURBT (TRANSURETHRAL RESECTION OF BLADDER TUMOR)
Anesthesia: General | Laterality: Left

## 2021-02-03 MED ORDER — CHLORHEXIDINE GLUCONATE 0.12 % MT SOLN
15.0000 mL | Freq: Once | OROMUCOSAL | Status: AC
  Administered 2021-02-03: 15 mL via OROMUCOSAL

## 2021-02-03 MED ORDER — STERILE WATER FOR IRRIGATION IR SOLN
Status: DC | PRN
  Administered 2021-02-03: 500 mL

## 2021-02-03 MED ORDER — CHLORHEXIDINE GLUCONATE CLOTH 2 % EX PADS
6.0000 | MEDICATED_PAD | Freq: Every day | CUTANEOUS | Status: DC
  Administered 2021-02-04 – 2021-02-05 (×2): 6 via TOPICAL

## 2021-02-03 MED ORDER — ONDANSETRON HCL 4 MG/2ML IJ SOLN: 4.0000 mg | INTRAMUSCULAR | Status: DC | PRN

## 2021-02-03 MED ORDER — DEXAMETHASONE SODIUM PHOSPHATE 10 MG/ML IJ SOLN
INTRAMUSCULAR | Status: AC
  Filled 2021-02-03: qty 1

## 2021-02-03 MED ORDER — FENTANYL CITRATE (PF) 100 MCG/2ML IJ SOLN
INTRAMUSCULAR | Status: AC
  Filled 2021-02-03: qty 2

## 2021-02-03 MED ORDER — HYOSCYAMINE SULFATE 0.125 MG SL SUBL
0.1250 mg | SUBLINGUAL_TABLET | SUBLINGUAL | Status: DC | PRN
  Administered 2021-02-03 – 2021-02-04 (×3): 0.125 mg via SUBLINGUAL
  Filled 2021-02-03 (×6): qty 1

## 2021-02-03 MED ORDER — SODIUM CHLORIDE 0.9 % IR SOLN
Status: DC | PRN
  Administered 2021-02-03: 12000 mL
  Administered 2021-02-03: 6000 mL
  Administered 2021-02-03: 1000 mL

## 2021-02-03 MED ORDER — ONDANSETRON HCL 4 MG/2ML IJ SOLN
INTRAMUSCULAR | Status: AC
  Filled 2021-02-03: qty 2

## 2021-02-03 MED ORDER — HYDROMORPHONE HCL 1 MG/ML IJ SOLN
0.5000 mg | INTRAMUSCULAR | Status: DC | PRN
  Administered 2021-02-03: 1 mg via INTRAVENOUS
  Administered 2021-02-03: 0.5 mg via INTRAVENOUS
  Administered 2021-02-04 – 2021-02-05 (×3): 1 mg via INTRAVENOUS
  Filled 2021-02-03 (×5): qty 1

## 2021-02-03 MED ORDER — OXYCODONE HCL 5 MG PO TABS
5.0000 mg | ORAL_TABLET | Freq: Once | ORAL | Status: AC
  Administered 2021-02-03: 5 mg via ORAL

## 2021-02-03 MED ORDER — CEPHALEXIN 500 MG PO CAPS
500.0000 mg | ORAL_CAPSULE | Freq: Three times a day (TID) | ORAL | Status: DC
  Administered 2021-02-03 – 2021-02-05 (×6): 500 mg via ORAL
  Filled 2021-02-03 (×7): qty 1

## 2021-02-03 MED ORDER — ACETAMINOPHEN 325 MG PO TABS: 650.0000 mg | ORAL_TABLET | ORAL | Status: DC | PRN

## 2021-02-03 MED ORDER — LIDOCAINE HCL (CARDIAC) PF 50 MG/5ML IV SOSY
PREFILLED_SYRINGE | INTRAVENOUS | Status: DC | PRN
  Administered 2021-02-03: 75 mg via INTRAVENOUS

## 2021-02-03 MED ORDER — OXYCODONE HCL 5 MG PO TABS
ORAL_TABLET | ORAL | Status: AC
  Filled 2021-02-03: qty 1

## 2021-02-03 MED ORDER — FLEET ENEMA 7-19 GM/118ML RE ENEM: 1.0000 | ENEMA | Freq: Once | RECTAL | Status: DC | PRN

## 2021-02-03 MED ORDER — OXYBUTYNIN CHLORIDE 5 MG PO TABS
5.0000 mg | ORAL_TABLET | Freq: Three times a day (TID) | ORAL | Status: DC | PRN
  Administered 2021-02-03 – 2021-02-05 (×3): 5 mg via ORAL
  Filled 2021-02-03 (×3): qty 1

## 2021-02-03 MED ORDER — POTASSIUM CHLORIDE IN NACL 20-0.45 MEQ/L-% IV SOLN
INTRAVENOUS | Status: DC
  Filled 2021-02-03 (×6): qty 1000

## 2021-02-03 MED ORDER — GUANFACINE HCL 1 MG PO TABS
2.0000 mg | ORAL_TABLET | Freq: Every day | ORAL | Status: DC
  Administered 2021-02-03 – 2021-02-04 (×2): 2 mg via ORAL
  Filled 2021-02-03 (×2): qty 2

## 2021-02-03 MED ORDER — OXYCODONE HCL 5 MG PO TABS
5.0000 mg | ORAL_TABLET | ORAL | Status: DC | PRN
  Administered 2021-02-03 – 2021-02-04 (×2): 5 mg via ORAL
  Filled 2021-02-03 (×4): qty 1

## 2021-02-03 MED ORDER — PHENYLEPHRINE HCL (PRESSORS) 10 MG/ML IV SOLN
INTRAVENOUS | Status: DC | PRN
  Administered 2021-02-03 (×2): 120 ug via INTRAVENOUS

## 2021-02-03 MED ORDER — DEXAMETHASONE SODIUM PHOSPHATE 10 MG/ML IJ SOLN
INTRAMUSCULAR | Status: DC | PRN
  Administered 2021-02-03: 4 mg via INTRAVENOUS

## 2021-02-03 MED ORDER — CEFAZOLIN SODIUM-DEXTROSE 2-4 GM/100ML-% IV SOLN
2.0000 g | INTRAVENOUS | Status: AC
  Administered 2021-02-03: 2 g via INTRAVENOUS
  Filled 2021-02-03: qty 100

## 2021-02-03 MED ORDER — BISACODYL 10 MG RE SUPP: 10.0000 mg | Freq: Every day | RECTAL | Status: DC | PRN

## 2021-02-03 MED ORDER — OXYCODONE HCL 5 MG/5ML PO SOLN: 5.0000 mg | Freq: Once | ORAL | Status: DC | PRN

## 2021-02-03 MED ORDER — OXYCODONE HCL 5 MG PO TABS: 5.0000 mg | ORAL_TABLET | Freq: Once | ORAL | Status: DC | PRN

## 2021-02-03 MED ORDER — 0.9 % SODIUM CHLORIDE (POUR BTL) OPTIME
TOPICAL | Status: DC | PRN
  Administered 2021-02-03: 1000 mL

## 2021-02-03 MED ORDER — FENTANYL CITRATE (PF) 100 MCG/2ML IJ SOLN
25.0000 ug | INTRAMUSCULAR | Status: DC | PRN
  Administered 2021-02-03 (×3): 50 ug via INTRAVENOUS

## 2021-02-03 MED ORDER — AMISULPRIDE (ANTIEMETIC) 5 MG/2ML IV SOLN
10.0000 mg | Freq: Once | INTRAVENOUS | Status: DC | PRN
Start: 2021-02-03 — End: 2021-02-03

## 2021-02-03 MED ORDER — FENTANYL CITRATE (PF) 100 MCG/2ML IJ SOLN
INTRAMUSCULAR | Status: DC | PRN
  Administered 2021-02-03: 25 ug via INTRAVENOUS
  Administered 2021-02-03: 50 ug via INTRAVENOUS
  Administered 2021-02-03: 25 ug via INTRAVENOUS
  Administered 2021-02-03 (×3): 50 ug via INTRAVENOUS

## 2021-02-03 MED ORDER — SENNOSIDES-DOCUSATE SODIUM 8.6-50 MG PO TABS
1.0000 | ORAL_TABLET | Freq: Every evening | ORAL | Status: DC | PRN
  Administered 2021-02-03: 1 via ORAL
  Filled 2021-02-03: qty 1

## 2021-02-03 MED ORDER — PROPOFOL 10 MG/ML IV BOLUS
INTRAVENOUS | Status: DC | PRN
  Administered 2021-02-03: 110 mg via INTRAVENOUS

## 2021-02-03 MED ORDER — ONDANSETRON HCL 4 MG/2ML IJ SOLN: 4.0000 mg | Freq: Once | INTRAMUSCULAR | Status: DC | PRN

## 2021-02-03 MED ORDER — ROCURONIUM BROMIDE 100 MG/10ML IV SOLN
INTRAVENOUS | Status: DC | PRN
  Administered 2021-02-03: 40 mg via INTRAVENOUS

## 2021-02-03 MED ORDER — BELLADONNA ALKALOIDS-OPIUM 16.2-60 MG RE SUPP
RECTAL | Status: AC
  Filled 2021-02-03: qty 1

## 2021-02-03 MED ORDER — METOPROLOL SUCCINATE ER 25 MG PO TB24
25.0000 mg | ORAL_TABLET | Freq: Two times a day (BID) | ORAL | Status: DC
  Administered 2021-02-03 – 2021-02-05 (×4): 25 mg via ORAL
  Filled 2021-02-03 (×4): qty 1

## 2021-02-03 MED ORDER — ENSURE ENLIVE PO LIQD
237.0000 mL | Freq: Two times a day (BID) | ORAL | Status: DC
  Administered 2021-02-04 – 2021-02-05 (×3): 237 mL via ORAL

## 2021-02-03 MED ORDER — LIDOCAINE 2% (20 MG/ML) 5 ML SYRINGE
INTRAMUSCULAR | Status: AC
  Filled 2021-02-03: qty 5

## 2021-02-03 MED ORDER — LORATADINE 10 MG PO TABS
10.0000 mg | ORAL_TABLET | Freq: Every day | ORAL | Status: DC
  Administered 2021-02-03 – 2021-02-05 (×3): 10 mg via ORAL
  Filled 2021-02-03 (×3): qty 1

## 2021-02-03 MED ORDER — PROPOFOL 10 MG/ML IV BOLUS
INTRAVENOUS | Status: AC
  Filled 2021-02-03: qty 20

## 2021-02-03 MED ORDER — SIMVASTATIN 20 MG PO TABS
20.0000 mg | ORAL_TABLET | Freq: Every day | ORAL | Status: DC
  Administered 2021-02-03 – 2021-02-04 (×2): 20 mg via ORAL
  Filled 2021-02-03 (×2): qty 1

## 2021-02-03 MED ORDER — BELLADONNA ALKALOIDS-OPIUM 16.2-60 MG RE SUPP
1.0000 | Freq: Once | RECTAL | Status: AC
Start: 2021-02-03 — End: 2021-02-03
  Administered 2021-02-03: 1 via RECTAL

## 2021-02-03 MED ORDER — ONDANSETRON HCL 4 MG/2ML IJ SOLN
INTRAMUSCULAR | Status: DC | PRN
  Administered 2021-02-03: 4 mg via INTRAVENOUS

## 2021-02-03 MED ORDER — SUGAMMADEX SODIUM 200 MG/2ML IV SOLN
INTRAVENOUS | Status: DC | PRN
  Administered 2021-02-03: 200 mg via INTRAVENOUS

## 2021-02-03 MED ORDER — PHENYLEPHRINE 40 MCG/ML (10ML) SYRINGE FOR IV PUSH (FOR BLOOD PRESSURE SUPPORT)
PREFILLED_SYRINGE | INTRAVENOUS | Status: AC
  Filled 2021-02-03: qty 10

## 2021-02-03 MED ORDER — ORAL CARE MOUTH RINSE: 15.0000 mL | Freq: Once | OROMUCOSAL | Status: AC

## 2021-02-03 MED ORDER — FENTANYL CITRATE (PF) 100 MCG/2ML IJ SOLN
50.0000 ug | Freq: Once | INTRAMUSCULAR | Status: AC
  Administered 2021-02-03: 50 ug via INTRAVENOUS

## 2021-02-03 MED ORDER — LACTATED RINGERS IV SOLN: INTRAVENOUS | Status: DC

## 2021-02-03 MED ORDER — LISINOPRIL 5 MG PO TABS
2.5000 mg | ORAL_TABLET | Freq: Every day | ORAL | Status: DC
  Administered 2021-02-03 – 2021-02-05 (×3): 2.5 mg via ORAL
  Filled 2021-02-03 (×3): qty 1

## 2021-02-03 SURGICAL SUPPLY — 21 items
BAG DRN RND TRDRP ANRFLXCHMBR (UROLOGICAL SUPPLIES)
BAG URINE DRAIN 2000ML AR STRL (UROLOGICAL SUPPLIES) IMPLANT
BAG URO CATCHER STRL LF (MISCELLANEOUS) ×2 IMPLANT
CATH FOLEY 2WAY SLVR  5CC 22FR (CATHETERS) ×2
CATH FOLEY 2WAY SLVR 5CC 22FR (CATHETERS) IMPLANT
CATH FOLEY 3WAY 30CC 22FR (CATHETERS) IMPLANT
CATH URET 5FR 28IN OPEN ENDED (CATHETERS) IMPLANT
DRAPE FOOT SWITCH (DRAPES) ×2 IMPLANT
GLOVE SURG POLYISO LF SZ8 (GLOVE) IMPLANT
GOWN STRL REUS W/TWL XL LVL3 (GOWN DISPOSABLE) ×2 IMPLANT
HOLDER FOLEY CATH W/STRAP (MISCELLANEOUS) IMPLANT
KIT TURNOVER KIT A (KITS) ×2 IMPLANT
LOOP CUT BIPOLAR 24F LRG (ELECTROSURGICAL) ×2 IMPLANT
MANIFOLD NEPTUNE II (INSTRUMENTS) ×2 IMPLANT
PACK CYSTO (CUSTOM PROCEDURE TRAY) ×2 IMPLANT
SUT ETHILON 3 0 PS 1 (SUTURE) IMPLANT
SYR 30ML LL (SYRINGE) IMPLANT
SYR TOOMEY IRRIG 70ML (MISCELLANEOUS) ×2
SYRINGE TOOMEY IRRIG 70ML (MISCELLANEOUS) IMPLANT
TUBING CONNECTING 10 (TUBING) ×2 IMPLANT
TUBING UROLOGY SET (TUBING) ×2 IMPLANT

## 2021-02-03 NOTE — Plan of Care (Signed)
  Problem: Education: Goal: Knowledge of General Education information will improve Description: Including pain rating scale, medication(s)/side effects and non-pharmacologic comfort measures Outcome: Progressing   Problem: Clinical Measurements: Goal: Cardiovascular complication will be avoided Outcome: Progressing   Problem: Activity: Goal: Risk for activity intolerance will decrease Outcome: Progressing   Problem: Nutrition: Goal: Adequate nutrition will be maintained Outcome: Progressing   Problem: Elimination: Goal: Will not experience complications related to bowel motility Outcome: Progressing Goal: Will not experience complications related to urinary retention Outcome: Progressing   Problem: Pain Managment: Goal: General experience of comfort will improve Outcome: Progressing

## 2021-02-03 NOTE — Anesthesia Procedure Notes (Signed)
Procedure Name: Intubation Date/Time: 02/03/2021 10:37 AM Performed by: Lissa Morales, CRNA Pre-anesthesia Checklist: Patient identified, Emergency Drugs available, Suction available and Patient being monitored Patient Re-evaluated:Patient Re-evaluated prior to induction Oxygen Delivery Method: Circle system utilized Preoxygenation: Pre-oxygenation with 100% oxygen Induction Type: IV induction Ventilation: Mask ventilation without difficulty LMA: LMA with gastric port inserted LMA Size: 5.0 Tube type: Oral Number of attempts: 1 Airway Equipment and Method: Oral airway Placement Confirmation: positive ETCO2 Tube secured with: Tape Dental Injury: Teeth and Oropharynx as per pre-operative assessment

## 2021-02-03 NOTE — Interval H&P Note (Signed)
History and Physical Interval Note:  His initial partial resection demonstrated T1 HGNMIBC.   He is not felt to be a candidate for a cystectomy so I am going to take him back for a second stage resection to try to get him clear so we can try BCG therapy.    02/03/2021 10:12 AM  [name].  has presented today for surgery, with the diagnosis of BLADDER TUMOR.  The various methods of treatment have been discussed with the patient and family. After consideration of risks, benefits and other options for treatment, the patient has consented to  Procedure(s) with comments: TRANSURETHRAL RESECTION OF BLADDER TUMOR (TURBT)POSSIBLE  LEFT  URETEROSECOPY (Left) - WITH PARALYPIC as a surgical intervention.  The patient's history has been reviewed, patient examined, no change in status, stable for surgery.  I have reviewed the patient's chart and labs.  Questions were answered to the patient's satisfaction.     Irine Seal

## 2021-02-03 NOTE — Transfer of Care (Signed)
Immediate Anesthesia Transfer of Care Note  Patient: [name].  Procedure(s) Performed: TRANSURETHRAL RESECTION OF BLADDER TUMOR (TURBT) (Left )  Patient Location: PACU  Anesthesia Type:General  Level of Consciousness: awake, alert , oriented and patient cooperative  Airway & Oxygen Therapy: Patient Spontanous Breathing and Patient connected to face mask oxygen  Post-op Assessment: Report given to RN, Post -op Vital signs reviewed and stable and Patient moving all extremities X 4  Post vital signs: stable  Last Vitals:  Vitals Value Taken Time  BP 140/118 02/03/21 1200  Temp 36.9 C 02/03/21 1155  Pulse 66 02/03/21 1204  Resp 13 02/03/21 1204  SpO2 100 % 02/03/21 1204  Vitals shown include unvalidated device data.  Last Pain:  Vitals:   02/03/21 0801  TempSrc:   PainSc: 0-No pain      Patients Stated Pain Goal: 3 (72/90/21 1155)  Complications: No complications documented.

## 2021-02-03 NOTE — Progress Notes (Addendum)
Patient foley catheter had clotted on arrival to floor from PACU.  Changed out foley to 24Fr 2 way and irrigated.  Small clot burden returned.  Moderately red urine draining after irrigation.  Will keep foley to gravity drainage and reassess in AM.    -manually irrigate as needed to keep foley draining -oxybutynin 5mg  PO q8 hr bladder spasms -B&O suppository PRN bladder spasms

## 2021-02-03 NOTE — Anesthesia Postprocedure Evaluation (Signed)
Anesthesia Post Note  Patient: [name].  Procedure(s) Performed: TRANSURETHRAL RESECTION OF BLADDER TUMOR (TURBT) (Left )     Patient location during evaluation: PACU Anesthesia Type: General Level of consciousness: awake and alert Pain management: pain level controlled Vital Signs Assessment: post-procedure vital signs reviewed and stable Respiratory status: spontaneous breathing, nonlabored ventilation and respiratory function stable Cardiovascular status: blood pressure returned to baseline and stable Postop Assessment: no apparent nausea or vomiting Anesthetic complications: no   No complications documented.  Last Vitals:  Vitals:   02/03/21 1245 02/03/21 1309  BP: (!) 157/84 121/76  Pulse: 71 62  Resp: (!) 22 (!) 22  Temp:    SpO2: 98% 96%    Last Pain:  Vitals:   02/03/21 0801  TempSrc:   PainSc: 0-No pain                 Lidia Collum

## 2021-02-03 NOTE — Anesthesia Preprocedure Evaluation (Addendum)
Anesthesia Evaluation  Patient identified by MRN, date of birth, ID band Patient awake    Reviewed: Allergy & Precautions, NPO status , Patient's Chart, lab work & pertinent test results, reviewed documented beta blocker date and time   History of Anesthesia Complications Negative for: history of anesthetic complications  Airway Mallampati: II  TM Distance: >3 FB Neck ROM: Full    Dental  (+) Dental Advisory Given, Teeth Intact   Pulmonary sleep apnea , former smoker,    Pulmonary exam normal        Cardiovascular hypertension, Pt. on medications and Pt. on home beta blockers + CAD  Normal cardiovascular exam     Neuro/Psych negative neurological ROS     GI/Hepatic negative GI ROS, Neg liver ROS,   Endo/Other  negative endocrine ROS  Renal/GU negative Renal ROS   Bladder tumor    Musculoskeletal negative musculoskeletal ROS (+)   Abdominal   Peds  Hematology negative hematology ROS (+)   Anesthesia Other Findings   Reproductive/Obstetrics                           Anesthesia Physical Anesthesia Plan  ASA: II  Anesthesia Plan: General   Post-op Pain Management:    Induction: Intravenous  PONV Risk Score and Plan: 2 and Ondansetron, Dexamethasone, Midazolam and Treatment may vary due to age or medical condition  Airway Management Planned: LMA  Additional Equipment: None  Intra-op Plan:   Post-operative Plan: Extubation in OR  Informed Consent: I have reviewed the patients History and Physical, chart, labs and discussed the procedure including the risks, benefits and alternatives for the proposed anesthesia with the patient or authorized representative who has indicated his/her understanding and acceptance.     Dental advisory given  Plan Discussed with:   Anesthesia Plan Comments:         Anesthesia Quick Evaluation

## 2021-02-03 NOTE — Addendum Note (Signed)
Addendum  created 02/03/21 1328 by Lissa Morales, CRNA   Intraprocedure Staff edited

## 2021-02-03 NOTE — Discharge Instructions (Signed)
Transurethral Resection of Bladder Tumor, Care After This sheet gives you information about how to care for yourself after your procedure. Your health care provider may also give you more specific instructions. If you have problems or questions, contact your health care provider. What can I expect after the procedure? After the procedure, it is common to have:  A small amount of blood in your urine for up to 2 weeks.  Soreness or mild pain from your catheter. After your catheter is removed, you may have mild soreness, especially when urinating.  Pain in your lower abdomen. Follow these instructions at home: Medicines  Take over-the-counter and prescription medicines only as told by your health care provider.  If you were prescribed an antibiotic medicine, take it as told by your health care provider. Do not stop taking the antibiotic even if you start to feel better.  Do not drive for 24 hours if you were given a sedative during your procedure.  Ask your health care provider if the medicine prescribed to you: ? Requires you to avoid driving or using heavy machinery. ? Can cause constipation. You may need to take these actions to prevent or treat constipation:  Take over-the-counter or prescription medicines.  Eat foods that are high in fiber, such as beans, whole grains, and fresh fruits and vegetables.  Limit foods that are high in fat and processed sugars, such as fried or sweet foods.   Activity  Return to your normal activities as told by your health care provider. Ask your health care provider what activities are safe for you.  Do not lift anything that is heavier than 10 lb (4.5 kg), or the limit that you are told, until your health care provider says that it is safe.  Avoid intense physical activity for as long as told by your health care provider.  Rest as told by your health care provider.  Avoid sitting for a long time without moving. Get up to take short walks every  1-2 hours. This is important to improve blood flow and breathing. Ask for help if you feel weak or unsteady. General instructions  Do not drink alcohol for as long as told by your health care provider. This is especially important if you are taking prescription pain medicines.  Do not take baths, swim, or use a hot tub until your health care provider approves. Ask your health care provider if you may take showers. You may only be allowed to take sponge baths.  If you have a catheter, follow instructions from your health care provider about caring for your catheter and your drainage bag.  Drink enough fluid to keep your urine pale yellow.  Wear compression stockings as told by your health care provider. These stockings help to prevent blood clots and reduce swelling in your legs.  Keep all follow-up visits as told by your health care provider. This is important. ? You will need to be followed closely with regular checks of your bladder and urethra (cystoscopies) to make sure that the cancer does not come back.   Contact a health care provider if:  You have pain that gets worse or does not improve with medicine.  You have blood in your urine for more than 2 weeks.  You have cloudy or bad-smelling urine.  You become constipated. Signs of constipation may include having: ? Fewer than three bowel movements in a week. ? Difficulty having a bowel movement. ? Stools that are dry, hard, or larger than normal.  You have a fever. Get help right away if:  You have: ? Severe pain. ? Bright red blood in your urine. ? Blood clots in your urine. ? A lot of blood in your urine.  Your catheter has been removed and you are not able to urinate.  You have a catheter in place and the catheter is not draining urine. Summary  After your procedure, it is common to have a small amount of blood in your urine, soreness or mild pain from your catheter, and pain in your lower abdomen.  Take  over-the-counter and prescription medicines only as told by your health care provider.  Rest as told by your health care provider. Follow your health care provider's instructions about returning to normal activities. Ask what activities are safe for you.  If you have a catheter, follow instructions from your health care provider about caring for your catheter and your drainage bag.  Get help right away if you cannot urinate, you have severe pain, or you have bright red blood or blood clots in your urine.  You may remove the catheter at home on Monday by cutting off the nipple on the side arm of the catheter.  The tube should slide out easily once the fluid drains.   This information is not intended to replace advice given to you by your health care provider. Make sure you discuss any questions you have with your health care provider. Document Revised: 05/15/2018 Document Reviewed: 05/15/2018 Elsevier Patient Education  [place].

## 2021-02-03 NOTE — Op Note (Signed)
Procedure: Cystoscopy with second stage transurethral resection of the large bladder tumor at overlapping sites.  Preop diagnosis: Extensive, multifocal T1 high-grade nonmuscle invasive bladder cancer with prior incomplete resection.  Postop diagnosis: Same.  Surgeon: Dr. Irine Seal.  Anesthesia: General.  Specimen: Bladder tumor chips.  Drains: 15 French Foley catheter.  EBL: 10 mL.  Complications: None.  Indications: The patient is an 80 year old male who was evaluated for hematuria and found to have multifocal bladder cancer which was found on initial resection to be T1 high-grade nonmuscle invasive disease.  It was felt that reresection was indicated due to the significant residual tumor burden and the patient's comorbidities that would make cystectomy high risk.  Procedure: He was given an antibiotic.  A general anesthetic was induced.  He was placed in lithotomy position and fitted with PAS hose.  His perineum and genitalia were prepped with Betadine solution he was draped in usual sterile fashion.  Paralytic agent was given to avoid the obturator reflex.  The 74 French continuous-flow resectoscope was inserted with the aid of visual obturator.  And once in the bladder was fitted with an Beatrix Fetters handle with a bipolar loop and 30 degree lens.  On insertion the urethra was unremarkable.  The external sphincter was intact.  The prostatic urethra had bilobar hyperplasia with coaptation.  The bladder neck had evidence of prior resection with some exudative material that was scraped away and removed to aid visualization.  Ureteral orifices were not seen.  The left ureteral orifice was noted to be in the resection bed and there were considerable inflammatory changes this area.  The right orifice was surrounded by edema.  Examination of bladder revealed multiple areas of prior resection on the right lateral wall, the dome, the bladder neck and left trigone, and a posterior wall.  He was still  noted to have significant tumor burden on the right lateral wall, dome, left lateral wall and anterior wall with a diameter of > 5cm.   The tumors were interspersed with areas of edema that had some evidence of neoplastic changes in areas and there was some erythematous mucosa noted as well that is suspicious for CIS.  After inspection, the residual tumor was resected as thoroughly as possible and additional areas were generously fulgurated. I felt I got into muscle in several areas to assess for invasion.   After completion of the resection, the bladder was evacuated free of chips and hemostasis was secured.  The bladder was drained and the scope was removed.  A 34fr foley was placed with clear return and then connected to a drainage bag.  He was taken down from the lithotomy position, his anesthetic was reversed and he was moved to the recovery room in stable condition.   There were no complications.   The residual tumor was resec

## 2021-02-04 ENCOUNTER — Observation Stay (HOSPITAL_COMMUNITY): Payer: Medicare HMO

## 2021-02-04 ENCOUNTER — Encounter (HOSPITAL_COMMUNITY): Payer: Self-pay | Admitting: Urology

## 2021-02-04 DIAGNOSIS — Z7984 Long term (current) use of oral hypoglycemic drugs: Secondary | ICD-10-CM | POA: Diagnosis not present

## 2021-02-04 DIAGNOSIS — Z79899 Other long term (current) drug therapy: Secondary | ICD-10-CM | POA: Diagnosis not present

## 2021-02-04 DIAGNOSIS — C678 Malignant neoplasm of overlapping sites of bladder: Secondary | ICD-10-CM | POA: Diagnosis not present

## 2021-02-04 DIAGNOSIS — Z87891 Personal history of nicotine dependence: Secondary | ICD-10-CM | POA: Diagnosis not present

## 2021-02-04 DIAGNOSIS — Z7982 Long term (current) use of aspirin: Secondary | ICD-10-CM | POA: Diagnosis not present

## 2021-02-04 DIAGNOSIS — C679 Malignant neoplasm of bladder, unspecified: Secondary | ICD-10-CM | POA: Diagnosis not present

## 2021-02-04 DIAGNOSIS — N200 Calculus of kidney: Secondary | ICD-10-CM | POA: Diagnosis not present

## 2021-02-04 DIAGNOSIS — E119 Type 2 diabetes mellitus without complications: Secondary | ICD-10-CM | POA: Diagnosis not present

## 2021-02-04 DIAGNOSIS — R310 Gross hematuria: Secondary | ICD-10-CM | POA: Diagnosis not present

## 2021-02-04 DIAGNOSIS — I2510 Atherosclerotic heart disease of native coronary artery without angina pectoris: Secondary | ICD-10-CM | POA: Diagnosis not present

## 2021-02-04 LAB — HEMOGLOBIN AND HEMATOCRIT, BLOOD
HCT: 40.7 % (ref 39.0–52.0)
Hemoglobin: 13.3 g/dL (ref 13.0–17.0)

## 2021-02-04 NOTE — Progress Notes (Signed)
Renal US IMPRESSION: 1. Possible obstructing LEFT UPJ calculus with mild distension of the LEFT renal pelvis. 2. Non-obstructing RIGHT LOWER pole renal calculi adjacent to a benign cyst, unchanged since the 01/03/2021 CT. 3. Decompressed urinary bladder containing multiple partially calcified masses as noted on the 01/03/2021 CT.   Electronically Signed   By: Evangeline Dakin M.D.   On: 02/04/2021 09:57   -foley draining OK without irrigation since AM rounds -renal US as above -will observe over course of day, NPO at midnight in event we need to go back to OR tomorrow -AM CBC -manually irrigate foley PRN

## 2021-02-04 NOTE — Progress Notes (Signed)
Urology Inpatient Progress Report    Intv/Subj: Patient's Foley required multiple irrigations overnight.  Catheter was upsized from 22Fr to 24Fr 2way by urology yesterday evening.  This morning catheter irrigated well but return of clot was seen.  Active Problems:   Malignant neoplasm of overlapping sites of bladder ([place])  Current Facility-Administered Medications  Medication Dose Route Frequency Provider Last Rate Last Admin  . 0.45 % NaCl with KCl 20 mEq / L infusion   Intravenous Continuous Irine Seal, MD 100 mL/hr at 02/04/21 0442 New Bag at 02/04/21 0442  . acetaminophen (TYLENOL) tablet 650 mg  650 mg Oral Q4H PRN Irine Seal, MD      . bisacodyl (DULCOLAX) suppository 10 mg  10 mg Rectal Daily PRN Irine Seal, MD      . cephALEXin [facility]) capsule 500 mg  500 mg Oral TID Irine Seal, MD   500 mg at 02/03/21 2130  . Chlorhexidine Gluconate Cloth 2 % PADS 6 each  6 each Topical Daily Irine Seal, MD      . feeding supplement (ENSURE ENLIVE / ENSURE PLUS) liquid 237 mL  237 mL Oral BID BM Irine Seal, MD      . guanFACINE (TENEX) tablet 2 mg  2 mg Oral QHS Irine Seal, MD   2 mg at 02/03/21 2130  . HYDROmorphone (DILAUDID) injection 0.5-1 mg  0.5-1 mg Intravenous Q2H PRN Irine Seal, MD   1 mg at 02/04/21 0255  . hyoscyamine (LEVSIN SL) SL tablet 0.125 mg  0.125 mg Sublingual Q4H PRN Irine Seal, MD   0.125 mg at 02/04/21 0150  . lisinopril (ZESTRIL) tablet 2.5 mg  2.5 mg Oral Daily Irine Seal, MD   2.5 mg at 02/03/21 1710  . loratadine (CLARITIN) tablet 10 mg  10 mg Oral Daily Irine Seal, MD   10 mg at 02/03/21 1710  . metoprolol succinate (TOPROL-XL) 24 hr tablet 25 mg  25 mg Oral BID Irine Seal, MD   25 mg at 02/03/21 2130  . ondansetron (ZOFRAN) injection 4 mg  4 mg Intravenous Q4H PRN Irine Seal, MD      . oxybutynin (DITROPAN) tablet 5 mg  5 mg Oral Q8H PRN Jacalyn Lefevre D, MD   5 mg at 02/03/21 1817  . oxyCODONE (Oxy IR/ROXICODONE) immediate release tablet 5 mg  5 mg Oral  Q4H PRN Irine Seal, MD   5 mg at 02/03/21 2130  . senna-docusate (Senokot-S) tablet 1 tablet  1 tablet Oral QHS PRN Irine Seal, MD   1 tablet at 02/03/21 1710  . simvastatin (ZOCOR) tablet 20 mg  20 mg Oral QHS Irine Seal, MD   20 mg at 02/03/21 2130  . sodium phosphate (FLEET) 7-19 GM/118ML enema 1 enema  1 enema Rectal Once PRN Irine Seal, MD         Objective: Vital: Vitals:   02/03/21 1734 02/03/21 2115 02/04/21 0105 02/04/21 0437  BP: (!) 160/69 121/63 (!) 115/59 107/63  Pulse: 73 77 67 78  Resp: 15 20 20 19   Temp: 98.7 F (37.1 C) 97.9 F (36.6 C) 97.7 F (36.5 C) 100.2 F (37.9 C)  TempSrc:  Oral Oral Oral  SpO2: 95% 92% 94% 93%  Weight:      Height:       I/Os: I/O last 3 completed shifts: In: 3931.2 [P.O.:100; I.V.:1681.2; Other:2150] Out: 2500 [Urine:2500]  Physical Exam:  General: Patient is in no apparent distress Lungs: Normal respiratory effort, chest expands symmetrically. GI: The abdomen is soft  and nontender without mass. Foley: 74 French two-way Foley draining light red urine Ext: lower extremities symmetric  Lab Results: No results for input(s): WBC, HGB, HCT in the last 72 hours. No results for input(s): NA, K, CL, CO2, GLUCOSE, BUN, CREATININE, CALCIUM in the last 72 hours. No results for input(s): LABPT, INR in the last 72 hours. No results for input(s): LABURIN in the last 72 hours. Results for orders placed or performed during the hospital encounter of 02/01/21  SARS CORONAVIRUS 2 (TAT 6-24 HRS) Nasopharyngeal Nasopharyngeal Swab     Status: None   Collection Time: 02/01/21 10:16 AM   Specimen: Nasopharyngeal Swab  Result Value Ref Range Status   SARS Coronavirus 2 NEGATIVE NEGATIVE Final    Comment: (NOTE) SARS-CoV-2 target nucleic acids are NOT DETECTED.  The SARS-CoV-2 RNA is generally detectable in upper and lower respiratory specimens during the acute phase of infection. Negative results do not preclude SARS-CoV-2 infection, do not  rule out co-infections with other pathogens, and should not be used as the sole basis for treatment or other patient management decisions. Negative results must be combined with clinical observations, patient history, and epidemiological information. The expected result is Negative.  Fact Sheet for Patients: SugarRoll.be  Fact Sheet for Healthcare Providers: https://www.woods-mathews.com/  This test is not yet approved or cleared by the [place] FDA and  has been authorized for detection and/or diagnosis of SARS-CoV-2 by FDA under an Emergency Use Authorization (EUA). This EUA will remain  in effect (meaning this test can be used) for the duration of the COVID-19 declaration under Se ction 564(b)(1) of the Act, 21 U.S.C. section 360bbb-3(b)(1), unless the authorization is terminated or revoked sooner.  Performed at [facility], [place] [address], [place], [place] [address]     Studies/Results: No results found.  Assessment: 80 year old man with history of high-grade T1 bladder cancer postop day 1 status post second stage TURBT with persisting gross hematuria and clotting of Foley catheter overnight.  -Obtain renal bladder ultrasound today to look for organized clot -Continue with patient n.p.o. until ultrasound results are back -We will get H&H this morning given amount of hematuria patient has had   Jacalyn Lefevre MD Urology 02/04/2021, 6:27 AM

## 2021-02-04 NOTE — Plan of Care (Signed)

## 2021-02-05 DIAGNOSIS — C679 Malignant neoplasm of bladder, unspecified: Secondary | ICD-10-CM | POA: Diagnosis not present

## 2021-02-05 DIAGNOSIS — E119 Type 2 diabetes mellitus without complications: Secondary | ICD-10-CM | POA: Diagnosis not present

## 2021-02-05 DIAGNOSIS — Z7982 Long term (current) use of aspirin: Secondary | ICD-10-CM | POA: Diagnosis not present

## 2021-02-05 DIAGNOSIS — Z87891 Personal history of nicotine dependence: Secondary | ICD-10-CM | POA: Diagnosis not present

## 2021-02-05 DIAGNOSIS — I2510 Atherosclerotic heart disease of native coronary artery without angina pectoris: Secondary | ICD-10-CM | POA: Diagnosis not present

## 2021-02-05 DIAGNOSIS — Z7984 Long term (current) use of oral hypoglycemic drugs: Secondary | ICD-10-CM | POA: Diagnosis not present

## 2021-02-05 DIAGNOSIS — R310 Gross hematuria: Secondary | ICD-10-CM | POA: Diagnosis not present

## 2021-02-05 DIAGNOSIS — C678 Malignant neoplasm of overlapping sites of bladder: Secondary | ICD-10-CM | POA: Diagnosis not present

## 2021-02-05 DIAGNOSIS — Z79899 Other long term (current) drug therapy: Secondary | ICD-10-CM | POA: Diagnosis not present

## 2021-02-05 NOTE — Progress Notes (Signed)
Pt and son, [name], were given and explained discharge instructions, along with follow up appointments that were scheduled. Pt and son educated how to care for the catheter and were given extra supplies needed for home use. All questions answered. IV removed. RN helped pt dress in personal clothing. Pt taken to main entrance via wheelchair.

## 2021-02-05 NOTE — Plan of Care (Signed)
  Problem: Activity: Goal: Risk for activity intolerance will decrease Outcome: Progressing   Problem: Pain Managment: Goal: General experience of comfort will improve Outcome: Progressing   

## 2021-02-05 NOTE — Discharge Summary (Signed)
Date of admission: 02/03/2021  Date of discharge: 02/05/2021  Admission diagnosis: Bladder cancer  Discharge diagnosis: Bladder cancer  Secondary diagnoses:  Patient Active Problem List   Diagnosis Date Noted  . Malignant neoplasm of overlapping sites of bladder ([place]) 01/17/2021  . OSA (obstructive sleep apnea) 08/05/2013  . Tourette's syndrome 04/01/2013    Procedures performed: Procedure(s): TRANSURETHRAL RESECTION OF BLADDER TUMOR (TURBT)  History and Physical: For full details, please see admission history and physical. Briefly, [name]. is a 80 y.o. year old patient with hx of HG T1 NMIBC POD2 s/p 2nd stage TURBT.   Hospital Course: Patient tolerated the procedure well.  He was then transferred to the floor after an uneventful PACU stay.  His hospital course was uncomplicated.  On POD#2 he had met discharge criteria: was eating a regular diet, was up and ambulating independently,  pain was well controlled, catheter was draining well and hematuria had improved and was ready to for discharge.   Laboratory values:  Recent Labs    02/04/21 0952  HGB 13.3  HCT 40.7   No results for input(s): NA, K, CL, CO2, GLUCOSE, BUN, CREATININE, CALCIUM in the last 72 hours. No results for input(s): LABPT, INR in the last 72 hours. No results for input(s): LABURIN in the last 72 hours. Results for orders placed or performed during the hospital encounter of 02/01/21  SARS CORONAVIRUS 2 (TAT 6-24 HRS) Nasopharyngeal Nasopharyngeal Swab     Status: None   Collection Time: 02/01/21 10:16 AM   Specimen: Nasopharyngeal Swab  Result Value Ref Range Status   SARS Coronavirus 2 NEGATIVE NEGATIVE Final    Comment: (NOTE) SARS-CoV-2 target nucleic acids are NOT DETECTED.  The SARS-CoV-2 RNA is generally detectable in upper and lower respiratory specimens during the acute phase of infection. Negative results do not preclude SARS-CoV-2 infection, do not rule out co-infections with other  pathogens, and should not be used as the sole basis for treatment or other patient management decisions. Negative results must be combined with clinical observations, patient history, and epidemiological information. The expected result is Negative.  Fact Sheet for Patients: SugarRoll.be  Fact Sheet for Healthcare Providers: https://www.woods-mathews.com/  This test is not yet approved or cleared by the [place] FDA and  has been authorized for detection and/or diagnosis of SARS-CoV-2 by FDA under an Emergency Use Authorization (EUA). This EUA will remain  in effect (meaning this test can be used) for the duration of the COVID-19 declaration under Se ction 564(b)(1) of the Act, 21 U.S.C. section 360bbb-3(b)(1), unless the authorization is terminated or revoked sooner.  Performed at [facility], [place] [address] [place], [place] [address]     Disposition: Home  Discharge instruction: The patient was instructed to be ambulatory but told to refrain from heavy lifting, strenuous activity, or driving.   Discharge medications:  Allergies as of 02/05/2021   No Known Allergies     Medication List    TAKE these medications   aspirin 81 MG EC tablet Take 81 mg by mouth daily. Swallow whole.   cetirizine 10 MG tablet Commonly known as: ZYRTEC Take 10 mg by mouth at bedtime.   CoQ-10 100 MG Caps Take 100 mg by mouth 2 (two) times daily.   guanFACINE 1 MG tablet Commonly known as: TENEX Take 2 tablets (2 mg total) by mouth at bedtime.   lisinopril 2.5 MG tablet Commonly known as: ZESTRIL Take 2.5 mg by mouth daily.   metoprolol succinate  25 MG 24 hr tablet Commonly known as: TOPROL-XL Take 25 mg by mouth 2 (two) times daily.   multivitamin with minerals Tabs tablet Take 1 tablet by mouth daily.   NEO-SYNEPHRINE NA Place 1 spray into the nose at bedtime as needed (congestion).   simvastatin 20 MG tablet Commonly  known as: ZOCOR Take 20 mg by mouth at bedtime.   vitamin C 500 MG tablet Commonly known as: ASCORBIC ACID Take 500 mg by mouth daily.       Followup:   Follow-up Information    Irine Seal, MD On 02/16/2021.   Specialty: Urology Why: 1pm.    Also the office will call about getting you for catheter removal next week if you don't feel you can do it at home. Contact information: [place] [address] [phone]

## 2021-02-07 ENCOUNTER — Telehealth: Payer: Self-pay

## 2021-02-07 LAB — SURGICAL PATHOLOGY

## 2021-02-07 NOTE — Telephone Encounter (Signed)
-----   Message from Irine Seal, MD sent at 02/03/2021 12:01 PM EDT ----- [name] needs to be brought in next week for foley removal.  I told him he could do it himself but I don't know that he is willing to do that.   He could have it out on Monday but if no one is around, it could wait until Thursday when I am there.

## 2021-02-07 NOTE — Telephone Encounter (Signed)
Patient is aware of appt for this Thursday for catheter removal.

## 2021-02-09 ENCOUNTER — Other Ambulatory Visit: Payer: Self-pay

## 2021-02-09 ENCOUNTER — Telehealth: Payer: Self-pay

## 2021-02-09 ENCOUNTER — Ambulatory Visit (INDEPENDENT_AMBULATORY_CARE_PROVIDER_SITE_OTHER): Payer: Medicare HMO

## 2021-02-09 DIAGNOSIS — C678 Malignant neoplasm of overlapping sites of bladder: Secondary | ICD-10-CM | POA: Diagnosis not present

## 2021-02-09 NOTE — Telephone Encounter (Signed)
I called [employer] yesterday to discuss the PA request for Guanfacine 1 mg from the pharmacy. I spoke with [name] Pharmacist, she sts the FDA has changed the recommendation for the this med and is only indicated for pediatric patients. I advised we would be willing to submit an appeal since the pt had been on the medication for several years and stable. Per [name], since the medication is not recommended by the FDA any longer, the appeal would not make a diffrence in the denial process.  she sts alternative meds are ingrezza, Abilify, haloperidol, Risperdal, and tetrabenazine. Pt is going to check with his pharmacist about the medication cost and let me know what the out pocket price would be. I advised pt to call back to let me know out of pocket cost and we would proceed from there. Pt verbalized understanding.

## 2021-02-09 NOTE — Telephone Encounter (Signed)
Pt called me back and he advised he had discussed the med cost with the pharmacy. 90 supply will be 68.87. Pt reports he will continue to get from [facility] and let us know if he has any issues.  Pt also reports he will call back to make f/u appt.

## 2021-02-09 NOTE — Progress Notes (Signed)
Catheter Removal  Patient is present today for a catheter removal.  78ml of water was drained from the balloon. A 24FR foley cath was removed from the bladder no complications were noted . Patient tolerated well.  Performed by: Henrry Feil, lpn  Follow up/ Additional notes: Keep scheduled post op

## 2021-02-16 ENCOUNTER — Other Ambulatory Visit: Payer: Medicare HMO | Admitting: Urology

## 2021-02-16 ENCOUNTER — Other Ambulatory Visit: Payer: Self-pay

## 2021-02-16 ENCOUNTER — Ambulatory Visit (INDEPENDENT_AMBULATORY_CARE_PROVIDER_SITE_OTHER): Payer: Medicare HMO | Admitting: Urology

## 2021-02-16 VITALS — BP 113/76 | HR 80 | Temp 98.2°F | Ht 70.0 in | Wt 198.4 lb

## 2021-02-16 DIAGNOSIS — C678 Malignant neoplasm of overlapping sites of bladder: Secondary | ICD-10-CM

## 2021-02-16 DIAGNOSIS — N200 Calculus of kidney: Secondary | ICD-10-CM | POA: Diagnosis not present

## 2021-02-16 NOTE — Progress Notes (Signed)
Subjective: 1. Malignant neoplasm of overlapping sites of bladder ([place])   2. Renal stones      12/15/20: [name] is an 80 yo male who is sent by Dr. Nevada Crane for hematuria.  He has a history of stones with prior ESWL in 2008 for a mixed calcium stone.  He has had some recent increased voiding symptoms.  His IPSS is 15 with a reduced stream and he has key in the door urgency with some UUI.   He has some intermittency.   He had an episode about 3 weeks ago.  He had been on Super Beta prostate and has been eating a lot of dried beets as well.  He may have had a small clot with the hematuria.  He had a few episodes over a week to 10 days.  He has had no flank pain.   His UA today has >30 RBC's, WBC 6-10 and mod bacteria.   His Cr was 0.86 in 10/20.  His PSA was only 0.6.   01/05/21: [name] returns today in f/u.  The CT hematuria study shows bladder findings consistent with multifocal urothelial neoplasms.    02/16/21: [name] returns today in f/u.  He had an initial resection of his multifocal bladder cancer on 01/17/21 but the resection was only partial because of the extent of the tumor.  He was found to have T1 HG NMIBC with squamous differentiation.  He was taken back for a second resection of the residual tumor on 02/03/21 and the pathology was confirmed.   He had no muscle invasion.  His left UO was not visualized at the second resection but there may have been minimal tumor involvement of the orifice on the initial resection.  A renal US on 4/9 showed only min/mild left hydro with a possible UPJ stone.  He did have very small renal stones on the CT in march.  He continues to have some incontinence and nocturia.  He has some residual hematuria and dysuria.   He has no flank pain.     ROS:  ROS  No Known Allergies  Past Medical History:  Diagnosis Date  . Anginal pain ([place]) 1997  . Coronary artery disease   . First degree AV block 01/16/2021   Noted on EKG  . History of kidney stones   .  Hypercholesteremia   . Hypertension   . Neuromuscular disorder ([place])    peripheral neuropathy  . OSA (obstructive sleep apnea) 08/05/2013  . Pre-diabetes   . Right bundle branch block (RBBB) 01/16/2021   Noted on EKG  . Sleep apnea   . Tourette syndrome   . Tourette's syndrome 04/01/2013    Past Surgical History:  Procedure Laterality Date  . BACK SURGERY  1983  . CARDIAC CATHETERIZATION  1997  . CATARACT EXTRACTION  11/2012  . heart stent  01/16/1996  . TRANSURETHRAL RESECTION OF BLADDER TUMOR Bilateral 01/17/2021   Procedure: TRANSURETHRAL RESECTION OF BLADDER TUMOR (TURBT);  Surgeon: Irine Seal, MD;  Location: [facility];  Service: Urology;  Laterality: Bilateral;  GENERAL ANESTHESIA WITH  PARLYSIS  . TRANSURETHRAL RESECTION OF BLADDER TUMOR Left 02/03/2021   Procedure: TRANSURETHRAL RESECTION OF BLADDER TUMOR (TURBT);  Surgeon: Irine Seal, MD;  Location: [facility];  Service: Urology;  Laterality: Left;  WITH PARALYPIC    Social History   Socioeconomic History  . Marital status: Married    Spouse name: Edd Fabian  . Number of children: 3  . Years of education: college  .  Highest education level: Not on file  Occupational History  . Not on file  Tobacco Use  . Smoking status: Former Research scientist (life sciences)  . Smokeless tobacco: Never Used  . Tobacco comment: Quit over 20 years ago  Vaping Use  . Vaping Use: Never used  Substance and Sexual Activity  . Alcohol use: No    Alcohol/week: 0.0 standard drinks  . Drug use: No  . Sexual activity: Not on file  Other Topics Concern  . Not on file  Social History Narrative   Patient consumes 2 cups of coffee daily   Social Determinants of Health   Financial Resource Strain: Not on file  Food Insecurity: Not on file  Transportation Needs: Not on file  Physical Activity: Not on file  Stress: Not on file  Social Connections: Not on file  Intimate Partner Violence: Not on file    Family History  Problem Relation Age of Onset  . Pancreatic cancer  Mother   . Heart disease Father     Anti-infectives: Anti-infectives (From admission, onward)   None      Current Outpatient Medications  Medication Sig Dispense Refill  . aspirin 81 MG EC tablet Take 81 mg by mouth daily. Swallow whole.    . cetirizine (ZYRTEC) 10 MG tablet Take 10 mg by mouth at bedtime.    . Coenzyme Q10 (COQ-10) 100 MG CAPS Take 100 mg by mouth 2 (two) times daily.    Marland Kitchen guanFACINE (TENEX) 1 MG tablet Take 2 tablets (2 mg total) by mouth at bedtime. 180 tablet 0  . lisinopril (ZESTRIL) 2.5 MG tablet Take 2.5 mg by mouth daily.    . metoprolol succinate (TOPROL-XL) 25 MG 24 hr tablet Take 25 mg by mouth 2 (two) times daily.    . Multiple Vitamin (MULTIVITAMIN WITH MINERALS) TABS tablet Take 1 tablet by mouth daily.    Marland Kitchen Phenylephrine HCl (NEO-SYNEPHRINE NA) Place 1 spray into the nose at bedtime as needed (congestion).    . simvastatin (ZOCOR) 20 MG tablet Take 20 mg by mouth at bedtime.    . vitamin C (ASCORBIC ACID) 500 MG tablet Take 500 mg by mouth daily.     No current facility-administered medications for this visit.     Objective: Vital signs in last 24 hours: BP 113/76   Pulse 80   Temp 98.2 F (36.8 C)   Ht 5\' 10"  (1.778 m)   Wt 198 lb 6.4 oz (90 kg)   BMI 28.47 kg/m   Intake/Output from previous day: No intake/output data recorded. Intake/Output this shift: @IOTHISSHIFT @   Physical Exam  Lab Results:  See UA and labs in history. No results found for this or any previous visit (from the past 24 hour(s)).  CLINICAL DATA:  80 year old with gross hematuria.  EXAM: RENAL / URINARY TRACT ULTRASOUND COMPLETE  COMPARISON:  Urinary tract ultrasound 09/09/2007 from [facility]. CT abdomen and pelvis 01/03/2021.  FINDINGS: Right Kidney:  Renal measurements: 11.9 x 8.1 x 6.8 cm = volume: 345 mL. Normal parenchymal echotexture. Well-preserved cortex for patient age. No hydronephrosis. Echogenic calculi involving lower pole  calices adjacent to a benign cyst measuring approximately 4.2 x 5.0 x 3.6 cm, unchanged from the recent CT.  Left Kidney:  Renal measurements: 12.1 x 5.7 x 6.1 cm = volume: 222 mL. Normal parenchymal echotexture. Well-preserved cortex for patient age. Mildly distended renal pelvis. Possible obstructing UVJ calculus. Benign cyst in the lower pole measuring 1.8 x 1.8 x 1.3 cm.  Bladder:  Decompressed with multiple partially calcified masses as noted on the recent CT.  Other:  None.  IMPRESSION: 1. Possible obstructing LEFT UPJ calculus with mild distension of the LEFT renal pelvis. 2. Non-obstructing RIGHT LOWER pole renal calculi adjacent to a benign cyst, unchanged since the 01/03/2021 CT. 3. Decompressed urinary bladder containing multiple partially calcified masses as noted on the 01/03/2021 CT.   Electronically Signed   By: Evangeline Dakin M.D.   On: 02/04/2021 09:57  Assessment/Plan: Extensive T1 HG NMIBC with squamous differentiation.  His options include cystectomy vs intravescial BCG.  I think with the extent of his disease and the variant histology that cystectomy would be the most prudent course if he is considered a reasonable surgical candidate.  I will have him see Dr. Tresa Moore for consideration.  If he is not a cystectomy candidate, I will get him back in for induction BCG and reviewed the risks and benefits.   Renal stones.  He has mild left hydro with a possible left UPJ stone.  I am going to get a CT stone study in the next several days to evaluated more clearly.  It would be difficult to handle a stone in the left ureter because of the recent bladder surgery.         No orders of the defined types were placed in this encounter.    Orders Placed This Encounter  Procedures  . CT RENAL STONE STUDY    Standing Status:   Future    Standing Expiration Date:   03/18/2021    Order Specific Question:   Preferred imaging location?    Answer:   [facility]    Order Specific Question:   Call Results- Best Contact Number?    Answer:   [phone]...Marland KitchenMarland KitchenMarland Kitchenpatient can leave    Order Specific Question:   Radiology Contrast Protocol - do NOT remove file path    Answer:   [email or web address]  . Urinalysis, Routine w reflex microscopic  . Ambulatory referral to Urology    Referral Priority:   Urgent    Referral Type:   Consultation    Referral Reason:   Specialty Services Required    Referred to Provider:   Alexis Frock, MD    Requested Specialty:   Urology    Number of Visits Requested:   1     Return in about 3 weeks (around 03/09/2021) for initiate BCG therapy. .    CC: Dr. Wende Neighbors.      Irine Seal 02/16/2021 [phone] ID: [name]., male   DOB: [birth year], 80 y.o.   MRN: [id number]

## 2021-02-16 NOTE — Progress Notes (Signed)
Urological Symptom Review  Patient is experiencing the following symptoms: Patient states nothing has changed.  Review of Systems  Gastrointestinal (upper)  : Negative for upper GI symptoms  Gastrointestinal (lower) : Negative for lower GI symptoms  Constitutional : Negative for symptoms  Skin: Negative for skin symptoms  Eyes: Negative for eye symptoms  Ear/Nose/Throat : Negative for Ear/Nose/Throat symptoms  Hematologic/Lymphatic: Negative for Hematologic/Lymphatic symptoms  Cardiovascular : Negative for cardiovascular symptoms  Respiratory : Negative for respiratory symptoms  Endocrine: Negative for endocrine symptoms  Musculoskeletal: Negative for musculoskeletal symptoms  Neurological: Negative for neurological symptoms  Psychologic: Negative for psychiatric symptoms

## 2021-02-17 ENCOUNTER — Ambulatory Visit (HOSPITAL_COMMUNITY)
Admission: RE | Admit: 2021-02-17 | Discharge: 2021-02-17 | Disposition: A | Discharge status: Home / Self Care | Payer: Medicare HMO | Source: Ambulatory Visit | Attending: Urology | Admitting: Urology

## 2021-02-17 ENCOUNTER — Other Ambulatory Visit: Payer: Self-pay

## 2021-02-17 DIAGNOSIS — N1330 Unspecified hydronephrosis: Secondary | ICD-10-CM | POA: Diagnosis not present

## 2021-02-17 DIAGNOSIS — K8689 Other specified diseases of pancreas: Secondary | ICD-10-CM | POA: Diagnosis not present

## 2021-02-17 DIAGNOSIS — I714 Abdominal aortic aneurysm, without rupture: Secondary | ICD-10-CM | POA: Diagnosis not present

## 2021-02-17 DIAGNOSIS — N200 Calculus of kidney: Secondary | ICD-10-CM | POA: Diagnosis not present

## 2021-02-17 NOTE — Progress Notes (Signed)
Results sent via my chart 

## 2021-02-24 ENCOUNTER — Telehealth: Payer: Self-pay

## 2021-02-24 NOTE — Telephone Encounter (Signed)
See below

## 2021-02-24 NOTE — Telephone Encounter (Signed)
Patient called to let Dr. Jeffie Pollock know he made the appointment with Dr. Tammi Klippel on May 31 @ 3:45.  Thanks, Helene Kelp

## 2021-02-26 DIAGNOSIS — E669 Obesity, unspecified: Secondary | ICD-10-CM | POA: Diagnosis not present

## 2021-02-26 DIAGNOSIS — K219 Gastro-esophageal reflux disease without esophagitis: Secondary | ICD-10-CM | POA: Diagnosis not present

## 2021-02-26 DIAGNOSIS — R809 Proteinuria, unspecified: Secondary | ICD-10-CM | POA: Diagnosis not present

## 2021-02-26 DIAGNOSIS — C678 Malignant neoplasm of overlapping sites of bladder: Secondary | ICD-10-CM | POA: Diagnosis not present

## 2021-02-26 DIAGNOSIS — N202 Calculus of kidney with calculus of ureter: Secondary | ICD-10-CM | POA: Diagnosis not present

## 2021-02-26 DIAGNOSIS — I10 Essential (primary) hypertension: Secondary | ICD-10-CM | POA: Diagnosis not present

## 2021-02-26 DIAGNOSIS — R32 Unspecified urinary incontinence: Secondary | ICD-10-CM | POA: Diagnosis not present

## 2021-02-26 DIAGNOSIS — E1169 Type 2 diabetes mellitus with other specified complication: Secondary | ICD-10-CM | POA: Diagnosis not present

## 2021-02-26 DIAGNOSIS — G4733 Obstructive sleep apnea (adult) (pediatric): Secondary | ICD-10-CM | POA: Diagnosis not present

## 2021-02-26 DIAGNOSIS — I2510 Atherosclerotic heart disease of native coronary artery without angina pectoris: Secondary | ICD-10-CM | POA: Diagnosis not present

## 2021-02-26 DIAGNOSIS — E782 Mixed hyperlipidemia: Secondary | ICD-10-CM | POA: Diagnosis not present

## 2021-03-21 DIAGNOSIS — E782 Mixed hyperlipidemia: Secondary | ICD-10-CM | POA: Diagnosis present

## 2021-03-21 DIAGNOSIS — I10 Essential (primary) hypertension: Secondary | ICD-10-CM | POA: Diagnosis present

## 2021-03-21 DIAGNOSIS — E119 Type 2 diabetes mellitus without complications: Secondary | ICD-10-CM

## 2021-03-22 DIAGNOSIS — I10 Essential (primary) hypertension: Secondary | ICD-10-CM | POA: Diagnosis not present

## 2021-03-22 DIAGNOSIS — E1169 Type 2 diabetes mellitus with other specified complication: Secondary | ICD-10-CM | POA: Diagnosis not present

## 2021-03-28 DIAGNOSIS — I10 Essential (primary) hypertension: Secondary | ICD-10-CM | POA: Diagnosis not present

## 2021-03-28 DIAGNOSIS — C678 Malignant neoplasm of overlapping sites of bladder: Secondary | ICD-10-CM | POA: Diagnosis not present

## 2021-03-28 DIAGNOSIS — N200 Calculus of kidney: Secondary | ICD-10-CM | POA: Diagnosis not present

## 2021-03-28 DIAGNOSIS — E782 Mixed hyperlipidemia: Secondary | ICD-10-CM | POA: Diagnosis not present

## 2021-03-28 DIAGNOSIS — G4733 Obstructive sleep apnea (adult) (pediatric): Secondary | ICD-10-CM | POA: Diagnosis not present

## 2021-03-28 DIAGNOSIS — N281 Cyst of kidney, acquired: Secondary | ICD-10-CM | POA: Diagnosis not present

## 2021-03-28 DIAGNOSIS — R32 Unspecified urinary incontinence: Secondary | ICD-10-CM | POA: Insufficient documentation

## 2021-03-28 DIAGNOSIS — N202 Calculus of kidney with calculus of ureter: Secondary | ICD-10-CM | POA: Diagnosis not present

## 2021-03-28 DIAGNOSIS — R809 Proteinuria, unspecified: Secondary | ICD-10-CM | POA: Diagnosis not present

## 2021-03-28 DIAGNOSIS — E669 Obesity, unspecified: Secondary | ICD-10-CM | POA: Diagnosis not present

## 2021-03-28 DIAGNOSIS — I2510 Atherosclerotic heart disease of native coronary artery without angina pectoris: Secondary | ICD-10-CM | POA: Diagnosis not present

## 2021-03-28 DIAGNOSIS — E1169 Type 2 diabetes mellitus with other specified complication: Secondary | ICD-10-CM | POA: Diagnosis not present

## 2021-04-06 ENCOUNTER — Other Ambulatory Visit: Payer: Self-pay | Admitting: Urology

## 2021-04-20 ENCOUNTER — Other Ambulatory Visit: Payer: Self-pay | Admitting: Urology

## 2021-04-25 ENCOUNTER — Other Ambulatory Visit (HOSPITAL_COMMUNITY): Payer: Self-pay

## 2021-05-09 DIAGNOSIS — C678 Malignant neoplasm of overlapping sites of bladder: Secondary | ICD-10-CM | POA: Diagnosis not present

## 2021-05-09 DIAGNOSIS — R8271 Bacteriuria: Secondary | ICD-10-CM | POA: Diagnosis not present

## 2021-05-18 ENCOUNTER — Inpatient Hospital Stay (HOSPITAL_COMMUNITY)
Admission: AD | Admit: 2021-05-18 | Discharge: 2021-05-25 | DRG: 654 | Disposition: A | Discharge status: Home Health | Payer: Medicare HMO | Attending: Urology | Admitting: Urology

## 2021-05-18 ENCOUNTER — Encounter (HOSPITAL_COMMUNITY): Payer: Self-pay | Admitting: Urology

## 2021-05-18 DIAGNOSIS — Z87891 Personal history of nicotine dependence: Secondary | ICD-10-CM

## 2021-05-18 DIAGNOSIS — I4510 Unspecified right bundle-branch block: Secondary | ICD-10-CM | POA: Diagnosis present

## 2021-05-18 DIAGNOSIS — Z79899 Other long term (current) drug therapy: Secondary | ICD-10-CM

## 2021-05-18 DIAGNOSIS — Z955 Presence of coronary angioplasty implant and graft: Secondary | ICD-10-CM

## 2021-05-18 DIAGNOSIS — Z87442 Personal history of urinary calculi: Secondary | ICD-10-CM

## 2021-05-18 DIAGNOSIS — F952 Tourette's disorder: Secondary | ICD-10-CM | POA: Diagnosis present

## 2021-05-18 DIAGNOSIS — Z7982 Long term (current) use of aspirin: Secondary | ICD-10-CM

## 2021-05-18 DIAGNOSIS — Z20822 Contact with and (suspected) exposure to covid-19: Secondary | ICD-10-CM | POA: Diagnosis present

## 2021-05-18 DIAGNOSIS — R7303 Prediabetes: Secondary | ICD-10-CM | POA: Diagnosis present

## 2021-05-18 DIAGNOSIS — N281 Cyst of kidney, acquired: Secondary | ICD-10-CM | POA: Diagnosis present

## 2021-05-18 DIAGNOSIS — E7800 Pure hypercholesterolemia, unspecified: Secondary | ICD-10-CM | POA: Diagnosis present

## 2021-05-18 DIAGNOSIS — I440 Atrioventricular block, first degree: Secondary | ICD-10-CM | POA: Diagnosis present

## 2021-05-18 DIAGNOSIS — I10 Essential (primary) hypertension: Secondary | ICD-10-CM | POA: Diagnosis present

## 2021-05-18 DIAGNOSIS — I2510 Atherosclerotic heart disease of native coronary artery without angina pectoris: Secondary | ICD-10-CM | POA: Diagnosis present

## 2021-05-18 DIAGNOSIS — G629 Polyneuropathy, unspecified: Secondary | ICD-10-CM | POA: Diagnosis present

## 2021-05-18 DIAGNOSIS — N1330 Unspecified hydronephrosis: Secondary | ICD-10-CM | POA: Diagnosis present

## 2021-05-18 DIAGNOSIS — C679 Malignant neoplasm of bladder, unspecified: Principal | ICD-10-CM | POA: Diagnosis present

## 2021-05-18 DIAGNOSIS — D099 Carcinoma in situ, unspecified: Secondary | ICD-10-CM | POA: Diagnosis present

## 2021-05-18 DIAGNOSIS — G4733 Obstructive sleep apnea (adult) (pediatric): Secondary | ICD-10-CM | POA: Diagnosis present

## 2021-05-18 DIAGNOSIS — Z8249 Family history of ischemic heart disease and other diseases of the circulatory system: Secondary | ICD-10-CM

## 2021-05-18 LAB — COMPREHENSIVE METABOLIC PANEL
ALT: 13 U/L (ref 0–44)
AST: 20 U/L (ref 15–41)
Albumin: 3.9 g/dL (ref 3.5–5.0)
Alkaline Phosphatase: 59 U/L (ref 38–126)
Anion gap: 8 (ref 5–15)
BUN: 23 mg/dL (ref 8–23)
CO2: 27 mmol/L (ref 22–32)
Calcium: 9.5 mg/dL (ref 8.9–10.3)
Chloride: 103 mmol/L (ref 98–111)
Creatinine, Ser: 0.77 mg/dL (ref 0.61–1.24)
GFR, Estimated: >60 mL/min (ref >60)
Glucose, Bld: 104 mg/dL — ABNORMAL HIGH (ref 70–99)
Potassium: 4 mmol/L (ref 3.5–5.1)
Sodium: 138 mmol/L (ref 135–145)
Total Bilirubin: 1.1 mg/dL (ref 0.3–1.2)
Total Protein: 7 g/dL (ref 6.5–8.1)

## 2021-05-18 LAB — CBC
HCT: 42.6 % (ref 39.0–52.0)
Hemoglobin: 13.9 g/dL (ref 13.0–17.0)
MCH: 29 pg (ref 26.0–34.0)
MCHC: 32.6 g/dL (ref 30.0–36.0)
MCV: 88.9 fL (ref 80.0–100.0)
Platelets: 250 10*3/uL (ref 150–400)
RBC: 4.79 MIL/uL (ref 4.22–5.81)
RDW: 13.4 % (ref 11.5–15.5)
WBC: 7 10*3/uL (ref 4.0–10.5)
nRBC: 0 % (ref 0.0–0.2)

## 2021-05-18 LAB — SARS CORONAVIRUS 2 BY RT PCR (HOSPITAL ORDER, PERFORMED IN ~~LOC~~ HOSPITAL LAB): SARS Coronavirus 2: NEGATIVE

## 2021-05-18 LAB — PREPARE RBC (CROSSMATCH)

## 2021-05-18 MED ORDER — PEG 3350-KCL-NA BICARB-NACL 420 G PO SOLR
4000.0000 mL | Freq: Once | ORAL | Status: AC
Start: 2021-05-18 — End: 2021-05-18
  Administered 2021-05-18: 4000 mL via ORAL

## 2021-05-18 MED ORDER — METOPROLOL SUCCINATE ER 25 MG PO TB24
12.5000 mg | ORAL_TABLET | Freq: Every day | ORAL | Status: DC
  Administered 2021-05-18 – 2021-05-19 (×2): 12.5 mg via ORAL
  Filled 2021-05-18 (×2): qty 1

## 2021-05-18 MED ORDER — PIPERACILLIN-TAZOBACTAM 3.375 G IVPB 30 MIN
3.3750 g | INTRAVENOUS | Status: AC
  Administered 2021-05-19: 3.375 g via INTRAVENOUS
  Filled 2021-05-18 (×2): qty 50

## 2021-05-18 MED ORDER — BOOST / RESOURCE BREEZE PO LIQD CUSTOM
1.0000 | Freq: Three times a day (TID) | ORAL | Status: DC
  Administered 2021-05-18: 1 via ORAL

## 2021-05-18 MED ORDER — METRONIDAZOLE 500 MG PO TABS
500.0000 mg | ORAL_TABLET | Freq: Four times a day (QID) | ORAL | Status: AC
  Administered 2021-05-18 – 2021-05-19 (×2): 500 mg via ORAL
  Filled 2021-05-18 (×2): qty 1

## 2021-05-18 MED ORDER — SODIUM CHLORIDE 0.9 % IV SOLN: INTRAVENOUS | Status: DC

## 2021-05-18 MED ORDER — SIMVASTATIN 20 MG PO TABS
20.0000 mg | ORAL_TABLET | Freq: Every day | ORAL | Status: DC
  Administered 2021-05-18: 20 mg via ORAL
  Filled 2021-05-18: qty 1

## 2021-05-18 MED ORDER — NEOMYCIN SULFATE 500 MG PO TABS
1000.0000 mg | ORAL_TABLET | Freq: Four times a day (QID) | ORAL | Status: AC
  Administered 2021-05-18 – 2021-05-19 (×2): 1000 mg via ORAL
  Filled 2021-05-18 (×3): qty 2

## 2021-05-18 NOTE — Plan of Care (Signed)
Pt oriented to room, call bell, and telephone.

## 2021-05-18 NOTE — H&P (Signed)
[name]. is an 80 y.o. male.    Chief Complaint: Pre-OP Cystoprostatectomy / Urinary Diversion  HPI:   1 - Large Volume, Clinical Stage 3 Bladder Cancer - High Grade very large multifocal urothellial carcinoma with 10% Sq differentiation by staged TURBT 12/2020-01/2021, resection incomplete given massive volume. CT 12/2020 with sigificant dome / anterior bladder wall serosal surface contour abnormality c/w T3 disease as well as some left mild malignant hydro (also makes clinical stage 3). Cr 0.84   2 - Right Non-Complex Peripelvic Renal Cyst - 4cm Rt pereipelvic cysts w/o mass effect or neoplastic features.    PMH sig for OSA, Tourettes, Obesity, CAD/Stents in 1990s (not limiting, no blood thinners, mows his own yard). His wife [name] is quite involved and the live indepentanly with family close by. His PCP is Alex Cahill MD in [place].   Today " [name]" is seen to proceed with curative intent cystectomy. He completed bowel prep to clear. Hgb 13.9, Cr <1. C19 screen negative.   Past Medical History:  Diagnosis Date   Anginal pain ([place]) 1997   Coronary artery disease    First degree AV block 01/16/2021   Noted on EKG   History of kidney stones    Hypercholesteremia    Hypertension    Neuromuscular disorder ([place])    peripheral neuropathy   OSA (obstructive sleep apnea) 08/05/2013   Pre-diabetes    Right bundle branch block (RBBB) 01/16/2021   Noted on EKG   Sleep apnea    Tourette syndrome    Tourette's syndrome 04/01/2013    Past Surgical History:  Procedure Laterality Date   [place]   CATARACT EXTRACTION  11/2012   heart stent  01/16/1996   TRANSURETHRAL RESECTION OF BLADDER TUMOR Bilateral 01/17/2021   Procedure: TRANSURETHRAL RESECTION OF BLADDER TUMOR (TURBT);  Surgeon: Irine Seal, MD;  Location: [facility];  Service: Urology;  Laterality: Bilateral;  GENERAL ANESTHESIA WITH  PARLYSIS   TRANSURETHRAL RESECTION OF BLADDER TUMOR Left  02/03/2021   Procedure: TRANSURETHRAL RESECTION OF BLADDER TUMOR (TURBT);  Surgeon: Irine Seal, MD;  Location: [facility];  Service: Urology;  Laterality: Left;  WITH PARALYPIC    Family History  Problem Relation Age of Onset   Pancreatic cancer Mother    Heart disease Father    Social History:  reports that he has quit smoking. He has never used smokeless tobacco. He reports that he does not drink alcohol and does not use drugs.  Allergies: No Known Allergies  Medications Prior to Admission  Medication Sig Dispense Refill   aspirin 81 MG EC tablet Take 81 mg by mouth daily. Swallow whole.     cetirizine (ZYRTEC) 10 MG tablet Take 10 mg by mouth at bedtime.     Coenzyme Q10 (COQ-10) 100 MG CAPS Take 100 mg by mouth 2 (two) times daily.     guanFACINE (TENEX) 1 MG tablet Take 2 tablets (2 mg total) by mouth at bedtime. 180 tablet 0   lisinopril (ZESTRIL) 2.5 MG tablet Take 2.5 mg by mouth in the morning.     metoprolol succinate (TOPROL-XL) 25 MG 24 hr tablet Take 25 mg by mouth in the morning and at bedtime.     simvastatin (ZOCOR) 20 MG tablet Take 20 mg by mouth at bedtime.      No results found for this or any previous visit (from the past 48 hour(s)). No results found.  Review of Systems  Constitutional:  Negative for chills and fever.  All other systems reviewed and are negative.  There were no vitals taken for this visit. Physical Exam Vitals reviewed.  HENT:     Head: Normocephalic.     Nose: Nose normal.  Eyes:     Pupils: Pupils are equal, round, and reactive to light.  Cardiovascular:     Rate and Rhythm: Normal rate.  Abdominal:     Comments: Stable moderate obesity  Genitourinary:    Comments: No CVAT.  Musculoskeletal:     Comments: Some stable chronic LE edema.   Skin:    General: Skin is warm.  Neurological:     General: No focal deficit present.     Mental Status: He is alert.  Psychiatric:        Mood and Affect: Mood normal.      Assessment/Plan  CMP, CBC, GO Lytely, IVF at 39, 1/2 home B Blocker, PO ABX prep, Stomal marking in preparation for major surgery tomorrow. Risks (including non-cure and mortality), benefits, alternatives, expected peri-op course discussed extensively previously and reiterated today.    Alexis Frock, MD 05/18/2021, 5:38 PM

## 2021-05-18 NOTE — Consult Note (Signed)
WOC Nurse Consult Note: WOC Nurse requested for preoperative stoma site marking by Dr. Tresa Moore.  Discussed surgical procedure and stoma creation with patient.  Explained role of the [place] nurse team.  Examined patient lying, sitting, and standing in order to place the marking in the patient's visual field, away from any creases or abdominal contour issues and within the rectus muscle.  There are many creases in the upper quadrants.  Marked for ileal conduit in the RUQ  7cm to the right of the umbilicus and  1cm above the umbilicus.  Patient's abdomen cleansed with CHG wipes at site markings, allowed to air dry prior to marking. Covered mark with thin film transparent dressing to preserve mark until date of surgery (tomorrow, 05/19/21)   [place] Nurse team will follow up with patient after surgery for continue ostomy care and teaching.   Thank you for allowing [place] to meet and mark this patient. Maudie Flakes, MSN, RN, [place], Arther Abbott  Pager# [phone]

## 2021-05-19 ENCOUNTER — Observation Stay (HOSPITAL_COMMUNITY): Payer: Medicare HMO | Admitting: Certified Registered"

## 2021-05-19 ENCOUNTER — Other Ambulatory Visit: Payer: Self-pay

## 2021-05-19 ENCOUNTER — Encounter (HOSPITAL_COMMUNITY)
Admission: AD | Disposition: A | Discharge status: Home Health | Payer: Medicare HMO | Source: Home / Self Care | Attending: Urology

## 2021-05-19 ENCOUNTER — Encounter (HOSPITAL_COMMUNITY): Payer: Self-pay | Admitting: Urology

## 2021-05-19 DIAGNOSIS — Z7982 Long term (current) use of aspirin: Secondary | ICD-10-CM | POA: Diagnosis not present

## 2021-05-19 DIAGNOSIS — N281 Cyst of kidney, acquired: Secondary | ICD-10-CM | POA: Diagnosis not present

## 2021-05-19 DIAGNOSIS — N400 Enlarged prostate without lower urinary tract symptoms: Secondary | ICD-10-CM | POA: Diagnosis not present

## 2021-05-19 DIAGNOSIS — Z79899 Other long term (current) drug therapy: Secondary | ICD-10-CM | POA: Diagnosis not present

## 2021-05-19 DIAGNOSIS — Z20822 Contact with and (suspected) exposure to covid-19: Secondary | ICD-10-CM | POA: Diagnosis not present

## 2021-05-19 DIAGNOSIS — I4510 Unspecified right bundle-branch block: Secondary | ICD-10-CM | POA: Diagnosis present

## 2021-05-19 DIAGNOSIS — R69 Illness, unspecified: Secondary | ICD-10-CM | POA: Diagnosis not present

## 2021-05-19 DIAGNOSIS — F952 Tourette's disorder: Secondary | ICD-10-CM | POA: Diagnosis not present

## 2021-05-19 DIAGNOSIS — Z87442 Personal history of urinary calculi: Secondary | ICD-10-CM | POA: Diagnosis not present

## 2021-05-19 DIAGNOSIS — Z955 Presence of coronary angioplasty implant and graft: Secondary | ICD-10-CM | POA: Diagnosis not present

## 2021-05-19 DIAGNOSIS — C679 Malignant neoplasm of bladder, unspecified: Secondary | ICD-10-CM | POA: Diagnosis not present

## 2021-05-19 DIAGNOSIS — I10 Essential (primary) hypertension: Secondary | ICD-10-CM | POA: Diagnosis not present

## 2021-05-19 DIAGNOSIS — Z9989 Dependence on other enabling machines and devices: Secondary | ICD-10-CM | POA: Diagnosis not present

## 2021-05-19 DIAGNOSIS — Z8249 Family history of ischemic heart disease and other diseases of the circulatory system: Secondary | ICD-10-CM | POA: Diagnosis not present

## 2021-05-19 DIAGNOSIS — G629 Polyneuropathy, unspecified: Secondary | ICD-10-CM | POA: Diagnosis present

## 2021-05-19 DIAGNOSIS — I440 Atrioventricular block, first degree: Secondary | ICD-10-CM | POA: Diagnosis not present

## 2021-05-19 DIAGNOSIS — D090 Carcinoma in situ of bladder: Secondary | ICD-10-CM | POA: Diagnosis not present

## 2021-05-19 DIAGNOSIS — G4733 Obstructive sleep apnea (adult) (pediatric): Secondary | ICD-10-CM | POA: Diagnosis not present

## 2021-05-19 DIAGNOSIS — Z87891 Personal history of nicotine dependence: Secondary | ICD-10-CM | POA: Diagnosis not present

## 2021-05-19 DIAGNOSIS — R7303 Prediabetes: Secondary | ICD-10-CM | POA: Diagnosis present

## 2021-05-19 DIAGNOSIS — E7800 Pure hypercholesterolemia, unspecified: Secondary | ICD-10-CM | POA: Diagnosis present

## 2021-05-19 DIAGNOSIS — D099 Carcinoma in situ, unspecified: Secondary | ICD-10-CM | POA: Diagnosis not present

## 2021-05-19 DIAGNOSIS — I2510 Atherosclerotic heart disease of native coronary artery without angina pectoris: Secondary | ICD-10-CM | POA: Diagnosis not present

## 2021-05-19 DIAGNOSIS — N1330 Unspecified hydronephrosis: Secondary | ICD-10-CM | POA: Diagnosis not present

## 2021-05-19 DIAGNOSIS — C678 Malignant neoplasm of overlapping sites of bladder: Secondary | ICD-10-CM | POA: Diagnosis not present

## 2021-05-19 HISTORY — PX: ROBOT ASSISTED LAPAROSCOPIC COMPLETE CYSTECT ILEAL CONDUIT: SHX5139

## 2021-05-19 HISTORY — PX: ROBOT ASSISTED LAPAROSCOPIC RADICAL PROSTATECTOMY: SHX5141

## 2021-05-19 HISTORY — PX: LYMPH NODE DISSECTION: SHX5087

## 2021-05-19 HISTORY — PX: CYSTOSCOPY WITH INJECTION: SHX1424

## 2021-05-19 LAB — HEMOGLOBIN AND HEMATOCRIT, BLOOD
HCT: 47.6 % (ref 39.0–52.0)
Hemoglobin: 14.9 g/dL (ref 13.0–17.0)

## 2021-05-19 LAB — ABO/RH: ABO/RH(D): A POS

## 2021-05-19 LAB — SURGICAL PCR SCREEN
MRSA, PCR: NEGATIVE
Staphylococcus aureus: NEGATIVE

## 2021-05-19 SURGERY — CYSTECTOMY, ROBOT-ASSISTED, WITH ILEAL CONDUIT CREATION
Anesthesia: General

## 2021-05-19 MED ORDER — PROPOFOL 10 MG/ML IV BOLUS
INTRAVENOUS | Status: DC | PRN
  Administered 2021-05-19: 30 mg via INTRAVENOUS
  Administered 2021-05-19: 130 mg via INTRAVENOUS

## 2021-05-19 MED ORDER — ROCURONIUM BROMIDE 10 MG/ML (PF) SYRINGE
PREFILLED_SYRINGE | INTRAVENOUS | Status: AC
  Filled 2021-05-19: qty 10

## 2021-05-19 MED ORDER — DEXTROSE-NACL 5-0.45 % IV SOLN: INTRAVENOUS | Status: DC

## 2021-05-19 MED ORDER — PIPERACILLIN-TAZOBACTAM 3.375 G IVPB
3.3750 g | Freq: Three times a day (TID) | INTRAVENOUS | Status: AC
  Administered 2021-05-19 – 2021-05-20 (×2): 3.375 g via INTRAVENOUS
  Filled 2021-05-19 (×2): qty 50

## 2021-05-19 MED ORDER — FENTANYL CITRATE (PF) 100 MCG/2ML IJ SOLN
INTRAMUSCULAR | Status: AC
  Filled 2021-05-19: qty 2

## 2021-05-19 MED ORDER — WATER FOR IRRIGATION, STERILE IR SOLN
Status: DC | PRN
  Administered 2021-05-19: 3000 mL

## 2021-05-19 MED ORDER — HYDROMORPHONE HCL 1 MG/ML IJ SOLN
0.5000 mg | INTRAMUSCULAR | Status: DC | PRN
  Administered 2021-05-19: 1 mg via INTRAVENOUS
  Filled 2021-05-19: qty 1

## 2021-05-19 MED ORDER — DEXAMETHASONE SODIUM PHOSPHATE 10 MG/ML IJ SOLN
INTRAMUSCULAR | Status: DC | PRN
  Administered 2021-05-19: 8 mg via INTRAVENOUS

## 2021-05-19 MED ORDER — SODIUM CHLORIDE (PF) 0.9 % IJ SOLN
INTRAMUSCULAR | Status: AC
  Filled 2021-05-19: qty 10

## 2021-05-19 MED ORDER — DEXMEDETOMIDINE BOLUS VIA INFUSION: 0.5000 ug/kg | Freq: Once | INTRAVENOUS | Status: DC

## 2021-05-19 MED ORDER — STERILE WATER FOR IRRIGATION IR SOLN
Status: DC | PRN
  Administered 2021-05-19: 1000 mL

## 2021-05-19 MED ORDER — HYDROMORPHONE HCL 1 MG/ML IJ SOLN
INTRAMUSCULAR | Status: DC | PRN
  Administered 2021-05-19 (×2): .6 mg via INTRAVENOUS

## 2021-05-19 MED ORDER — LACTATED RINGERS IV SOLN: INTRAVENOUS | Status: DC

## 2021-05-19 MED ORDER — FENTANYL CITRATE (PF) 100 MCG/2ML IJ SOLN: 25.0000 ug | INTRAMUSCULAR | Status: DC | PRN

## 2021-05-19 MED ORDER — STERILE WATER FOR INJECTION IJ SOLN
INTRAMUSCULAR | Status: DC | PRN
  Administered 2021-05-19: 10 mL via INTRAVENOUS

## 2021-05-19 MED ORDER — SUGAMMADEX SODIUM 200 MG/2ML IV SOLN
INTRAVENOUS | Status: DC | PRN
  Administered 2021-05-19: 200 mg via INTRAVENOUS

## 2021-05-19 MED ORDER — ROCURONIUM BROMIDE 10 MG/ML (PF) SYRINGE
PREFILLED_SYRINGE | INTRAVENOUS | Status: DC | PRN
  Administered 2021-05-19: 10 mg via INTRAVENOUS
  Administered 2021-05-19: 60 mg via INTRAVENOUS
  Administered 2021-05-19 (×2): 20 mg via INTRAVENOUS
  Administered 2021-05-19: 40 mg via INTRAVENOUS

## 2021-05-19 MED ORDER — ONDANSETRON HCL 4 MG/2ML IJ SOLN
4.0000 mg | INTRAMUSCULAR | Status: DC | PRN
  Administered 2021-05-20 – 2021-05-21 (×2): 4 mg via INTRAVENOUS
  Filled 2021-05-19 (×2): qty 2

## 2021-05-19 MED ORDER — HYDROMORPHONE HCL 2 MG/ML IJ SOLN
INTRAMUSCULAR | Status: AC
  Filled 2021-05-19: qty 1

## 2021-05-19 MED ORDER — DIPHENHYDRAMINE HCL 12.5 MG/5ML PO ELIX: 12.5000 mg | ORAL_SOLUTION | Freq: Four times a day (QID) | ORAL | Status: DC | PRN

## 2021-05-19 MED ORDER — PHENYLEPHRINE HCL (PRESSORS) 10 MG/ML IV SOLN
INTRAVENOUS | Status: AC
  Filled 2021-05-19: qty 2

## 2021-05-19 MED ORDER — BUPIVACAINE LIPOSOME 1.3 % IJ SUSP
20.0000 mL | Freq: Once | INTRAMUSCULAR | Status: AC
  Administered 2021-05-19: 20 mL
  Filled 2021-05-19: qty 20

## 2021-05-19 MED ORDER — MUPIROCIN 2 % EX OINT: 1.0000 "application " | TOPICAL_OINTMENT | Freq: Two times a day (BID) | CUTANEOUS | Status: DC

## 2021-05-19 MED ORDER — ACETAMINOPHEN 10 MG/ML IV SOLN
1000.0000 mg | Freq: Four times a day (QID) | INTRAVENOUS | Status: AC
  Administered 2021-05-19 – 2021-05-20 (×4): 1000 mg via INTRAVENOUS
  Filled 2021-05-19 (×4): qty 100

## 2021-05-19 MED ORDER — FENTANYL CITRATE (PF) 100 MCG/2ML IJ SOLN
INTRAMUSCULAR | Status: DC | PRN
  Administered 2021-05-19: 50 ug via INTRAVENOUS
  Administered 2021-05-19: 100 ug via INTRAVENOUS
  Administered 2021-05-19 (×5): 50 ug via INTRAVENOUS

## 2021-05-19 MED ORDER — DEXAMETHASONE 9 % IO SUSP: 0.0050 mL | Freq: Once | INTRAOCULAR | Status: DC

## 2021-05-19 MED ORDER — OXYCODONE HCL 5 MG PO TABS
5.0000 mg | ORAL_TABLET | ORAL | Status: DC | PRN
  Administered 2021-05-20: 5 mg via ORAL
  Filled 2021-05-19 (×2): qty 1

## 2021-05-19 MED ORDER — LACTATED RINGERS IR SOLN
Status: DC | PRN
  Administered 2021-05-19: 1000 mL

## 2021-05-19 MED ORDER — LACTATED RINGERS IV SOLN: INTRAVENOUS | Status: DC | PRN

## 2021-05-19 MED ORDER — LIDOCAINE 2% (20 MG/ML) 5 ML SYRINGE
INTRAMUSCULAR | Status: DC | PRN
  Administered 2021-05-19: 20 mg via INTRAVENOUS

## 2021-05-19 MED ORDER — ONDANSETRON HCL 4 MG/2ML IJ SOLN
INTRAMUSCULAR | Status: DC | PRN
  Administered 2021-05-19: 4 mg via INTRAVENOUS

## 2021-05-19 MED ORDER — SENNOSIDES-DOCUSATE SODIUM 8.6-50 MG PO TABS
2.0000 | ORAL_TABLET | Freq: Every day | ORAL | Status: DC
  Administered 2021-05-20 – 2021-05-24 (×4): 2 via ORAL
  Filled 2021-05-19 (×5): qty 2

## 2021-05-19 MED ORDER — DIPHENHYDRAMINE HCL 50 MG/ML IJ SOLN
12.5000 mg | Freq: Four times a day (QID) | INTRAMUSCULAR | Status: DC | PRN
  Administered 2021-05-24: 12.5 mg via INTRAVENOUS
  Filled 2021-05-19: qty 1

## 2021-05-19 SURGICAL SUPPLY — 127 items
ADH SKN CLS APL DERMABOND .7 (GAUZE/BANDAGES/DRESSINGS) ×2
AGENT HMST KT MTR STRL THRMB (HEMOSTASIS)
APL ESCP 34 STRL LF DISP (HEMOSTASIS)
APL PRP STRL LF DISP 70% ISPRP (MISCELLANEOUS) ×2
APL SKNCLS STERI-STRIP NONHPOA (GAUZE/BANDAGES/DRESSINGS)
APL SWBSTK 6 STRL LF DISP (MISCELLANEOUS) ×2
APPLICATOR COTTON TIP 6 STRL (MISCELLANEOUS) ×2 IMPLANT
APPLICATOR COTTON TIP 6IN STRL (MISCELLANEOUS) ×3
APPLICATOR SURGIFLO ENDO (HEMOSTASIS) IMPLANT
BAG COUNTER SPONGE SURGICOUNT (BAG) IMPLANT
BAG LAPAROSCOPIC 12 15 PORT 16 (BASKET) ×2 IMPLANT
BAG RETRIEVAL 12/15 (BASKET) ×3
BAG SPNG CNTER NS LX DISP (BAG)
BAG URO CATCHER STRL LF (MISCELLANEOUS) ×3 IMPLANT
BENZOIN TINCTURE PRP APPL 2/3 (GAUZE/BANDAGES/DRESSINGS) IMPLANT
BLADE SURG SZ10 CARB STEEL (BLADE) IMPLANT
CATH FOLEY 2WAY SLVR 18FR 30CC (CATHETERS) ×3 IMPLANT
CATH SILICONE 5CC 18FR (INSTRUMENTS) ×3 IMPLANT
CATH TIEMANN FOLEY 18FR 5CC (CATHETERS) ×3 IMPLANT
CELLS DAT CNTRL 66122 CELL SVR (MISCELLANEOUS) ×2 IMPLANT
CHLORAPREP W/TINT 26 (MISCELLANEOUS) ×3 IMPLANT
CLIP VESOLOCK LG 6/CT PURPLE (CLIP) ×6 IMPLANT
CLIP VESOLOCK MED LG 6/CT (CLIP) ×3 IMPLANT
CLIP VESOLOCK XL 6/CT (CLIP) ×6 IMPLANT
CLOTH BEACON ORANGE TIMEOUT ST (SAFETY) ×3 IMPLANT
CNTNR URN SCR LID CUP LEK RST (MISCELLANEOUS) ×2 IMPLANT
CONT SPEC 4OZ STRL OR WHT (MISCELLANEOUS) ×3
COVER SURGICAL LIGHT HANDLE (MISCELLANEOUS) ×3 IMPLANT
COVER TIP SHEARS 8 DVNC (MISCELLANEOUS) ×2 IMPLANT
COVER TIP SHEARS 8MM DA VINCI (MISCELLANEOUS) ×3
CUTTER ECHEON FLEX ENDO 45 340 (ENDOMECHANICALS) ×3 IMPLANT
DECANTER SPIKE VIAL GLASS SM (MISCELLANEOUS) ×3 IMPLANT
DERMABOND ADVANCED (GAUZE/BANDAGES/DRESSINGS) ×1
DERMABOND ADVANCED .7 DNX12 (GAUZE/BANDAGES/DRESSINGS) ×4 IMPLANT
DRAIN CHANNEL RND F F (WOUND CARE) IMPLANT
DRAIN PENROSE 0.5X18 (DRAIN) IMPLANT
DRAPE ARM DVNC X/XI (DISPOSABLE) ×8 IMPLANT
DRAPE COLUMN DVNC XI (DISPOSABLE) ×2 IMPLANT
DRAPE DA VINCI XI ARM (DISPOSABLE) ×12
DRAPE DA VINCI XI COLUMN (DISPOSABLE) ×3
DRAPE SURG IRRIG POUCH 19X23 (DRAPES) ×3 IMPLANT
DRSG TEGADERM 4X4.75 (GAUZE/BANDAGES/DRESSINGS) ×3 IMPLANT
ELECT PENCIL ROCKER SW 15FT (MISCELLANEOUS) ×3 IMPLANT
ELECT REM PT RETURN 15FT ADLT (MISCELLANEOUS) ×3 IMPLANT
GAUZE 4X4 16PLY ~~LOC~~+RFID DBL (SPONGE) IMPLANT
GAUZE SPONGE 2X2 8PLY STRL LF (GAUZE/BANDAGES/DRESSINGS) IMPLANT
GLOVE SURG ENC MOIS LTX SZ6.5 (GLOVE) ×6 IMPLANT
GLOVE SURG ENC TEXT LTX SZ7.5 (GLOVE) ×9 IMPLANT
GLOVE SURG UNDER POLY LF SZ7.5 (GLOVE) ×3 IMPLANT
GOWN STRL REUS W/TWL LRG LVL3 (GOWN DISPOSABLE) ×15 IMPLANT
HOLDER FOLEY CATH W/STRAP (MISCELLANEOUS) ×3 IMPLANT
IRRIG SUCT STRYKERFLOW 2 WTIP (MISCELLANEOUS) ×3
IRRIGATION SUCT STRKRFLW 2 WTP (MISCELLANEOUS) ×2 IMPLANT
IV LACTATED RINGERS 1000ML (IV SOLUTION) ×3 IMPLANT
KIT PROCEDURE DA VINCI SI (MISCELLANEOUS) ×3
KIT PROCEDURE DVNC SI (MISCELLANEOUS) ×2 IMPLANT
KIT TURNOVER KIT A (KITS) ×3 IMPLANT
LOOP VESSEL MAXI BLUE (MISCELLANEOUS) ×3 IMPLANT
MANIFOLD NEPTUNE II (INSTRUMENTS) ×3 IMPLANT
NDL ASPIRATION 22 (NEEDLE) ×1 IMPLANT
NDL INSUFFLATION 14GA 120MM (NEEDLE) ×2 IMPLANT
NDL SPNL 22GX7 QUINCKE BK (NEEDLE) ×2 IMPLANT
NEEDLE ASPIRATION 22 (NEEDLE) ×3 IMPLANT
NEEDLE INSUFFLATION 14GA 120MM (NEEDLE) ×3 IMPLANT
NEEDLE SPNL 22GX7 QUINCKE BK (NEEDLE) ×3 IMPLANT
PACK CYSTO (CUSTOM PROCEDURE TRAY) ×3 IMPLANT
PACK ROBOT UROLOGY CUSTOM (CUSTOM PROCEDURE TRAY) ×3 IMPLANT
PAD POSITIONING PINK XL (MISCELLANEOUS) ×3 IMPLANT
PORT ACCESS TROCAR AIRSEAL 12 (TROCAR) ×2 IMPLANT
PORT ACCESS TROCAR AIRSEAL 5M (TROCAR) ×1
RELOAD STAPLE 45 4.1 GRN THCK (STAPLE) ×2 IMPLANT
RELOAD STAPLE 60 2.6 WHT THN (STAPLE) ×6 IMPLANT
RELOAD STAPLE 60 4.1 GRN THCK (STAPLE) ×6 IMPLANT
RELOAD STAPLER GREEN 60MM (STAPLE) ×6 IMPLANT
RELOAD STAPLER WHITE 60MM (STAPLE) ×6 IMPLANT
RETRACTOR LONRSTAR 16.6X16.6CM (MISCELLANEOUS) IMPLANT
RETRACTOR STAY HOOK 5MM (MISCELLANEOUS) IMPLANT
RETRACTOR STER APS 16.6X16.6CM (MISCELLANEOUS)
RETRACTOR WND ALEXIS 18 MED (MISCELLANEOUS) ×2 IMPLANT
RTRCTR WOUND ALEXIS 18CM MED (MISCELLANEOUS) ×3
SEAL CANN UNIV 5-8 DVNC XI (MISCELLANEOUS) ×8 IMPLANT
SEAL XI 5MM-8MM UNIVERSAL (MISCELLANEOUS) ×12
SET TRI-LUMEN FLTR TB AIRSEAL (TUBING) ×3 IMPLANT
SOLUTION ELECTROLUBE (MISCELLANEOUS) ×3 IMPLANT
SPONGE GAUZE 2X2 STER 10/PKG (GAUZE/BANDAGES/DRESSINGS)
SPONGE T-LAP 18X18 ~~LOC~~+RFID (SPONGE) ×6 IMPLANT
SPONGE T-LAP 4X18 ~~LOC~~+RFID (SPONGE) ×3 IMPLANT
STAPLE RELOAD 45 GRN (STAPLE) ×2 IMPLANT
STAPLE RELOAD 45MM GREEN (STAPLE) ×3
STAPLER ECHELON LONG 60 440 (INSTRUMENTS) ×3 IMPLANT
STAPLER RELOAD GREEN 60MM (STAPLE) ×9
STAPLER RELOAD WHITE 60MM (STAPLE) ×9
STENT SET URETHERAL LEFT 7FR (STENTS) ×3 IMPLANT
STENT SET URETHERAL RIGHT 7FR (STENTS) ×3 IMPLANT
SURGIFLO W/THROMBIN 8M KIT (HEMOSTASIS) IMPLANT
SUT CHROMIC 4 0 RB 1X27 (SUTURE) ×3 IMPLANT
SUT ETHILON 3 0 PS 1 (SUTURE) ×3 IMPLANT
SUT MNCRL AB 4-0 PS2 18 (SUTURE) ×6 IMPLANT
SUT PDS AB 0 CTX 36 PDP370T (SUTURE) ×9 IMPLANT
SUT PDS AB 1 CT1 27 (SUTURE) ×6 IMPLANT
SUT SILK 3 0 SH 30 (SUTURE) IMPLANT
SUT SILK 3 0 SH CR/8 (SUTURE) ×3 IMPLANT
SUT V-LOC BARB 180 2/0GR6 GS22 (SUTURE)
SUT VIC AB 2-0 CT1 27 (SUTURE)
SUT VIC AB 2-0 CT1 27XBRD (SUTURE) IMPLANT
SUT VIC AB 2-0 SH 18 (SUTURE) IMPLANT
SUT VIC AB 2-0 SH 27 (SUTURE) ×3
SUT VIC AB 2-0 SH 27X BRD (SUTURE) ×2 IMPLANT
SUT VIC AB 2-0 UR5 27 (SUTURE) ×12 IMPLANT
SUT VIC AB 3-0 SH 27 (SUTURE) ×18
SUT VIC AB 3-0 SH 27X BRD (SUTURE) ×4 IMPLANT
SUT VIC AB 3-0 SH 27XBRD (SUTURE) ×8 IMPLANT
SUT VIC AB 4-0 RB1 27 (SUTURE) ×12
SUT VIC AB 4-0 RB1 27XBRD (SUTURE) ×8 IMPLANT
SUT VICRYL 0 UR6 27IN ABS (SUTURE) ×3 IMPLANT
SUT VLOC BARB 180 ABS3/0GR12 (SUTURE) ×9
SUTURE V-LC BRB 180 2/0GR6GS22 (SUTURE) IMPLANT
SUTURE VLOC BRB 180 ABS3/0GR12 (SUTURE) ×6 IMPLANT
SYR 27GX1/2 1ML LL SAFETY (SYRINGE) ×3 IMPLANT
SYR CONTROL 10ML LL (SYRINGE) IMPLANT
SYSTEM UROSTOMY GENTLE TOUCH (WOUND CARE) ×3 IMPLANT
TOWEL OR NON WOVEN STRL DISP B (DISPOSABLE) ×3 IMPLANT
TROCAR BLADELESS 15MM (ENDOMECHANICALS) ×3 IMPLANT
TROCAR XCEL NON-BLD 5MMX100MML (ENDOMECHANICALS) IMPLANT
TUBING CONNECTING 10 (TUBING) ×3 IMPLANT
WATER STERILE IRR 1000ML POUR (IV SOLUTION) ×3 IMPLANT
WATER STERILE IRR 3000ML UROMA (IV SOLUTION) ×3 IMPLANT

## 2021-05-19 NOTE — Brief Op Note (Signed)
05/19/2021  6:02 PM  PATIENT:  [name].  80 y.o. male  PRE-OPERATIVE DIAGNOSIS:  BLADDER CANCER  POST-OPERATIVE DIAGNOSIS:  BLADDER CANCER  PROCEDURE:  Procedure(s): XI ROBOTIC ASSISTED LAPAROSCOPIC COMPLETE CYSTECT ILEAL CONDUIT (N/A) XI ROBOTIC ASSISTED LAPAROSCOPIC RADICAL PROSTATECTOMY (N/A) LYMPH NODE DISSECTION (Bilateral) CYSTOSCOPY WITH INJECTION OF INDOCYANINE GREEN DYE (N/A)  SURGEON:  Surgeon(s) and Role:    Alexis Frock, MD - Primary  PHYSICIAN ASSISTANT:   ASSISTANTSDebbrah Alar PA   DICTATION #: [id number]  ANESTHESIA:   general  EBL:  100 mL   BLOOD ADMINISTERED:none  DRAINS:  1 -JP to bulb; 2 - Urostomy to gravity with Rt (red) and LT (blue) bander stents    LOCAL MEDICATIONS USED:  MARCAINE     SPECIMEN:  Source of Specimen:  1 - pelvic lymph nodes; 2 - cystoprostatectomy; 3 - ureteral margins  DISPOSITION OF SPECIMEN:  PATHOLOGY  COUNTS:  YES  TOURNIQUET:  * No tourniquets in log *  DICTATION: .Other Dictation: Dictation Number    PLAN OF CARE: Admit to inpatient   PATIENT DISPOSITION:  PACU - hemodynamically stable.   Delay start of Pharmacological VTE agent (>24hrs) due to surgical blood loss or risk of bleeding: no

## 2021-05-19 NOTE — Progress Notes (Signed)
MD on call was notified to see if there was an update on the diet order following surgery. MD on call stated that the patient is to remain strictly NPO.

## 2021-05-19 NOTE — Transfer of Care (Signed)
Immediate Anesthesia Transfer of Care Note  Patient: [name].  Procedure(s) Performed: XI ROBOTIC ASSISTED LAPAROSCOPIC COMPLETE CYSTECT ILEAL CONDUIT XI ROBOTIC ASSISTED LAPAROSCOPIC RADICAL PROSTATECTOMY LYMPH NODE DISSECTION (Bilateral) CYSTOSCOPY WITH INJECTION OF INDOCYANINE GREEN DYE  Patient Location: PACU  Anesthesia Type:General  Level of Consciousness: awake and pateint uncooperative  Airway & Oxygen Therapy: Patient Spontanous Breathing and Patient connected to face mask oxygen  Post-op Assessment: Report given to RN and Post -op Vital signs reviewed and stable  Post vital signs: Reviewed and stable  Last Vitals:  Vitals Value Taken Time  BP 162/102 05/19/21 1819  Temp    Pulse 84 05/19/21 1821  Resp 23 05/19/21 1821  SpO2 99 % 05/19/21 1821  Vitals shown include unvalidated device data.  Last Pain:  Vitals:   05/19/21 0850  TempSrc:   PainSc: 0-No pain      Patients Stated Pain Goal: 0 ([id number])  Complications: No notable events documented.

## 2021-05-19 NOTE — Anesthesia Procedure Notes (Signed)
Procedure Name: Intubation Date/Time: 05/19/2021 12:51 PM Performed by: Niel Hummer, CRNA Pre-anesthesia Checklist: Patient identified and Emergency Drugs available Patient Re-evaluated:Patient Re-evaluated prior to induction Oxygen Delivery Method: Circle system utilized Preoxygenation: Pre-oxygenation with 100% oxygen Induction Type: IV induction Ventilation: Mask ventilation without difficulty Laryngoscope Size: Mac and 4 Grade View: Grade II Tube type: Oral Tube size: 7.5 mm Number of attempts: 1 Airway Equipment and Method: Stylet Placement Confirmation: ETT inserted through vocal cords under direct vision, positive ETCO2 and breath sounds checked- equal and bilateral Secured at: 23 cm Tube secured with: Tape Dental Injury: Teeth and Oropharynx as per pre-operative assessment

## 2021-05-19 NOTE — Anesthesia Postprocedure Evaluation (Signed)
Anesthesia Post Note  Patient: Quaylon Avant.  Procedure(s) Performed: XI ROBOTIC ASSISTED LAPAROSCOPIC COMPLETE CYSTECT ILEAL CONDUIT XI ROBOTIC ASSISTED LAPAROSCOPIC RADICAL PROSTATECTOMY LYMPH NODE DISSECTION (Bilateral) CYSTOSCOPY WITH INJECTION OF INDOCYANINE GREEN DYE     Patient location during evaluation: PACU Anesthesia Type: General Level of consciousness: awake and alert Pain management: pain level controlled Vital Signs Assessment: post-procedure vital signs reviewed and stable Respiratory status: spontaneous breathing, nonlabored ventilation, respiratory function stable and patient connected to nasal cannula oxygen Cardiovascular status: blood pressure returned to baseline and stable Postop Assessment: no apparent nausea or vomiting Anesthetic complications: no   No notable events documented.  Last Vitals:  Vitals:   05/19/21 1930 05/19/21 2011  BP: 118/78 (!) 134/54  Pulse: 62 65  Resp: 10 18  Temp: 36.4 C 36.5 C  SpO2: 97% 100%    Last Pain:  Vitals:   05/19/21 2011  TempSrc: Oral  PainSc:                  Eleni Frank

## 2021-05-19 NOTE — Discharge Instructions (Signed)

## 2021-05-19 NOTE — Anesthesia Preprocedure Evaluation (Addendum)
Anesthesia Evaluation  Patient identified by MRN, date of birth, ID band Patient awake    Reviewed: Allergy & Precautions, NPO status , Patient's Chart, lab work & pertinent test results  History of Anesthesia Complications Negative for: history of anesthetic complications  Airway Mallampati: II  TM Distance: >3 FB Neck ROM: Full    Dental  (+) Dental Advisory Given, Caps   Pulmonary sleep apnea and Continuous Positive Airway Pressure Ventilation , former smoker,  05/18/2021 SARS coronavirus NEG   breath sounds clear to auscultation       Cardiovascular hypertension, Pt. on medications and Pt. on home beta blockers (-) angina+ CAD and + Cardiac Stents   Rhythm:Regular Rate:Normal     Neuro/Psych PSYCHIATRIC DISORDERS (Tourette's) negative neurological ROS     GI/Hepatic negative GI ROS, Neg liver ROS,   Endo/Other  negative endocrine ROS  Renal/GU negative Renal ROS     Musculoskeletal   Abdominal   Peds  Hematology negative hematology ROS (+)   Anesthesia Other Findings   Reproductive/Obstetrics                            Anesthesia Physical Anesthesia Plan  ASA: 3  Anesthesia Plan: General   Post-op Pain Management:    Induction: Intravenous  PONV Risk Score and Plan: 2 and Ondansetron and Dexamethasone  Airway Management Planned: Oral ETT  Additional Equipment: None  Intra-op Plan:   Post-operative Plan: Extubation in OR  Informed Consent: I have reviewed the patients History and Physical, chart, labs and discussed the procedure including the risks, benefits and alternatives for the proposed anesthesia with the patient or authorized representative who has indicated his/her understanding and acceptance.     Dental advisory given  Plan Discussed with: CRNA and Surgeon  Anesthesia Plan Comments:        Anesthesia Quick Evaluation

## 2021-05-20 ENCOUNTER — Encounter (HOSPITAL_COMMUNITY): Payer: Self-pay | Admitting: Urology

## 2021-05-20 LAB — BASIC METABOLIC PANEL
Anion gap: 8 (ref 5–15)
BUN: 20 mg/dL (ref 8–23)
CO2: 23 mmol/L (ref 22–32)
Calcium: 8.7 mg/dL — ABNORMAL LOW (ref 8.9–10.3)
Chloride: 106 mmol/L (ref 98–111)
Creatinine, Ser: 1.06 mg/dL (ref 0.61–1.24)
GFR, Estimated: >60 mL/min (ref >60)
Glucose, Bld: 217 mg/dL — ABNORMAL HIGH (ref 70–99)
Potassium: 4.6 mmol/L (ref 3.5–5.1)
Sodium: 137 mmol/L (ref 135–145)

## 2021-05-20 LAB — HEMOGLOBIN AND HEMATOCRIT, BLOOD
HCT: 38.4 % — ABNORMAL LOW (ref 39.0–52.0)
Hemoglobin: 12.8 g/dL — ABNORMAL LOW (ref 13.0–17.0)

## 2021-05-20 MED ORDER — HEPARIN SODIUM (PORCINE) 5000 UNIT/ML IJ SOLN
5000.0000 [IU] | Freq: Three times a day (TID) | INTRAMUSCULAR | Status: DC
  Administered 2021-05-20 – 2021-05-25 (×15): 5000 [IU] via SUBCUTANEOUS
  Filled 2021-05-20 (×15): qty 1

## 2021-05-20 MED ORDER — ORAL CARE MOUTH RINSE
15.0000 mL | Freq: Two times a day (BID) | OROMUCOSAL | Status: DC
  Administered 2021-05-20 – 2021-05-25 (×10): 15 mL via OROMUCOSAL

## 2021-05-20 MED ORDER — ALVIMOPAN 12 MG PO CAPS
12.0000 mg | ORAL_CAPSULE | Freq: Two times a day (BID) | ORAL | Status: DC
  Administered 2021-05-20 – 2021-05-23 (×7): 12 mg via ORAL
  Filled 2021-05-20 (×8): qty 1

## 2021-05-20 NOTE — Op Note (Signed)
[name], [name] Wallace MEDICAL RECORD NO: [id number] ACCOUNT NO: [id number] DATE OF BIRTH: Jun 12, 1941 FACILITY: [facility] LOCATION: [id number] PHYSICIAN: Alexis Frock, MD  Operative Report   DATE OF PROCEDURE: 05/19/2021  PREOPERATIVE DIAGNOSIS:  Muscle invasive bladder cancer.  PROCEDURE PERFORMED: 1.  Cystoscopy with injection of indocyanine green dye. 2.  Robotic-assisted laparoscopic radical cystectomy with prostatectomy, bilateral pelvic lymph node dissection, ileal conduit urinary diversion.  ESTIMATED BLOOD LOSS:  250 mL.  ASSISTANT:  Debbrah Alar, PA  FINDINGS: 1.  Relatively small volume residual tumor in the bladder, mostly at the dome with cystoscopy. 2.  Bilateral sentinel lymph nodes denoted specifically on pathology requisition.  DRAINS: 1.  Jackson-Pratt drain to bulb suction. 2.  Right lower quadrant urostomy to gravity drainage with right (red) and left (blue) bander stents.  INDICATIONS:  The patient is a very pleasant and very vigorous 80 year old man who was found earlier this year to have muscle invasive bladder cancer actually clinical stage III and some mild right hydronephrosis.  Staging imaging was clinically  localized. Options discussed for definitive management including curative and noncurative paths.  The patient adamantly wished to proceed with curative path with cystectomy and urinary diversion.  Again, he has an excellent functional status for age and  understands the gravity of this path.  He was admitted yesterday for bowel prep, stomal marking and labs and presents for cystectomy today.  Informed consent was obtained and placed in medical record.  PROCEDURE IN DETAIL:  The patient being himself, procedure being cystoscopy with ICG dye injection and robotic cystoprostatectomy with urinary diversion was confirmed.  Procedure timeout was performed.  Intravenous antibiotics were administered.  General  endotracheal anesthesia was induced.  The patient placed  into a low lithotomy position.  Sterile field was created prepping and draping the patient's penis, perineum, and proximal thigh using iodine and his infraxiphoid abdomen using chlorhexidine  gluconate.  He was further fastened on the operating table using 3-inch tape over foam padding across supraxiphoid chest.  A test steep Trendelenburg positioning was performed.  He was found to be suitably positioned.  Foley catheter was placed per  urethra to straight drain, silicone type.  Next, attention was directed to the cystourethroscopy.  Cystourethroscopy was performed with a 24-French injection scope with 0-degree lens.  Inspection of anterior and posterior urethra was unremarkable.   Inspection of the bladder revealed multifocal relatively small volume residual urothelial carcinoma and then patchy erythema consistent with likely associated carcinoma in situ.  This was mostly at the dome and posterior area.  Next, 2 mL of indocyanine  green dye was injected across 5 submucosal blebs in the area of the trigone and a new silicone type Foley catheter was placed per urethra to straight drain.  Next, a high-flow, low pressure, pneumoperitoneum was obtained using Veress technique in the  supraumbilical midline having passed the aspiration drop test. 8 mm robotic camera port was then placed in location.  Laparoscopic examination of the peritoneal cavity revealed minimal loose adhesions in the right lower quadrant of some distal small  bowel.  No visceral injury.  Additional ports were placed as follows:  Right paramedian 8 mm robotic port, a right paramedian 15 mm assistant port at the previously marked stomal site, right far lateral 12 mm AirSeal assistant port site, left paramedian  8 mm robotic port, left far lateral 8 mm robotic port.  Robot was docked and passed the electronic checks.  Attention was directed at the left retroperitoneal dissection.  Next,  an incision was made lateral to the descending colon near  the iliac  crossing superiorly.  This was approximately 10 cm and then distally coursing lateral to the left median umbilical ligament towards the anterior abdominal wall.  This created a large retroperitoneal flap, which retracted medially.  The left ureter was  encountered as it course across the iliac vessel with marked with vessel loop, dissected proximally for a distance of approximately 8 cm to the area of the iliac crossing distally to the ureterovesical junction were doubly clipped and ligated. Proximal  clip containing a tag suture dyed type.  Care was taken to avoid devascularization of the ureter.  It was tucked to the true pelvis on the left side.  There was good exposure of the left retroperitoneum. Attention was directed at the left-sided  lymphadenectomy.  The pelvis was inspected under near infrared fluorescence light and there were sentinel lymph nodes noted bilaterally.  These were denoted on pathology requisition. First, left external group was dissected free with the boundaries  being external iliac vein, artery, pelvic sidewall, iliac bifurcation.  Hemostasis achieved with clips.  This was set aside and labeled as left external iliac lymph nodes.  Next, left obturator group dissected free, the boundaries being the left external  iliac vein, pelvic sidewall and obturator nerve.  Hemostasis achieved with clips.  This was set aside, labeled as left obturator lymph nodes.  Left obturator nerve was inspected following maneuvers and found to be uninjured.  Next, the left common group  dissected free.  Boundaries being the aortic bifurcation, iliac bifurcation.  Lymphostasis again achieved with cold clips,  set aside labeled as left common iliac lymph node and finally an area of left internal iliac group dissected free, boundaries  being iliac bifurcation, superior vesicle artery, set aside and labeled as left internal iliac lymph nodes.  The bladder wall was further developed away from the  pelvic sidewall through the endopelvic fascia lateral to the prostate on left side.  This  completed the left retroperitoneal portion of dissection.  Attention was directed to the right sided dissection, first identifying the ileocecal junction as noted by the appendix and the distal ileum was traced proximally for a distance of 14 cm.  This  appeared to be a suitable geometry for conduit malformation.  This was marked at the level of the mesentery using a silk tag suture and a clip distal to denote proximal distal orientation.  Incision was made lateral to the ascending colon near the cecum  superiorly for distance of approximately 10 cm and then distally coursing lateral to the right medial umbilical ligament.  Thus, performing a large retroperitoneal flap on the right side, which dissected medially.  Right ureter was encountered as it  coursed over the iliac vessels, marked with vessel loop, dissected proximally to the gonadal vein, crossing distally to the ureterovesical junction which was doubly clipped and ligated, proximal clip containing a white tag suture.  Frozen section  negative for carcinoma as it was on the left.  Right ureter was then tucked out of the true pelvis.  This exposed the right sided pelvic lymph node field and lymphadenectomy was performed on the right side.  As for the left with right common iliac,  external iliac, internal iliac, obturator group respectively and additional tissue taken from the area of the aortic bifurcation, which was labeled as such.  Attention was directed to the retroperitoneal mobilization of the left ureter via the right sided posterior  peritoneal  flap. Dissection proceeded directly onto the aortic bifurcation and the left ureter was visualized, brought through this tunnel to the right side and the right ureter, left ureter, terminal ileum tag sutures were placed through single  Hem-o-Lok clip for later identification.  These were all tucked into the area  of true pelvis.  Attention was then directed the posterior dissection.  The previous lateral posterior peritoneal flaps were connected via a U-shaped inverted incision behind  the bladder, thus creating a posterior peritoneal flap, which was then developed at the 6 o'clock position distally towards the area of the apex of the prostate and the flap was further swept away laterally.  This exposed the vascular pedicles of the  bladder and prostate which were controlled using white load stapler x2 each side, which resulted in excellent hemostatic control of the pedicles.  Next, the space of Retzius was developed between the medial umbilical ligaments.  This exposed the dorsal  venous complex which was controlled using green load stapler.  Final apical dissection was performed in the anterior plane placing the bladder and prostate on superior traction, transecting the membranous urethra coldly for 50% of its anterior  circumference.  The in-situ Foley catheter was then used as a bucket handle brought into the field, doubly clipped and the posterior circumference of the membranous urethra was then transected.  This completely freed up the bladder plus prostate en bloc  specimen, which was placed in extra-large EndoCatch bag purposely to the left hemi-side of the abdomen and pelvis was once again inspected.  Hemostasis was excellent.  Sponge and needle counts were correct.  A digital rectal exam was performed using  indicator glove and laparoscopic vision and no evidence of rectal violation was noted.  The membranous urethral stump was oversewn using 3-0 V-Loc to prevent prolonged penile drainage.  A close suction drain was brought through the previous left lateral  most robotic port site into the deep pelvis and the right ureter, left ureter, terminal ileum tag sutures were grasped with a laparoscopic grasper via the 15 mm port site.  Robot was then undocked.  Specimen was retrieved by extending the previous  camera  port site inferiorly erring to the left side umbilicus for a total distance of approximately 7 cm and the bladder plus prostate en bloc specimen sent for permanent pathology.  Next, a wound protector type retractor was then placed in the right ureter,  left ureter and terminal ileum areas were brought through this.  Attention was directed at harvesting the conduit.  The previously noted distal ileum was visualized and taken down in continuity using green load stapler.  The mesentery further developed  distally using 2 loads of white load stapler.  Exquisite care was taken to avoid devascularization of the anastomotic or conduit segment.  This was traced 14 cm proximally and the proximal aspect of the conduit was developed by taking this out of  continuity using a green load stapler and the mesentery further developed with one load of white load stapler.  Again, the conduit anastomotic segments appeared to be suitably vascular, conduit segment was oriented in retroperitoneal location.  Attention  was then directed towards the bowel-bowel anastomosis.  This was performed using 2 loads of the green load stapler on the antimesenteric border with the free end being oversewn using running silk and a second imbricating layer running silk.  The acute  angle of the bowel anastomosis was bolstered using interrupted silk.  The mesenteric defect was reapproximated using  interrupted silk.  The bowel-bowel anastomosis was visually viable, palpably patent, and was redelivered into abdominal cavity.   Attention was then redirected to the conduit malformation.  The distal staple line was removed as were the tagged sutures.  The proximal staple line was occluded using running Vicryl.  Attention was directed at the anastomosis of the left distal ureter.   A 4 mm segment of proximal conduit was excised in a V-shaped formation, Potts scissors, and 4 mucosal everting sutures were applied of 4-0 Vicryl and the distal left  ureter was trimmed to length, spatulated for a distance of approximately centimeter.   Final margins sent to permanent pathology.  A heel stitch of 4-0 Vicryl was placed and then a blue colored bander stent was placed 26 cm to the anastomosis, which was further developed using two separate running suture lines in a heel-to-toe fashion,  which revealed an excellent tension-free apposition.  The blue color bander stent was then anchored to the midpoint of the conduit using a single air knot chromic to prevent inadvertent early displacement.  Next, the right ureter was anastomosed in a  similar fashion as per the left by placing a red color bander stent 26 cm to the anastomosis and the permanent distal margin sent as well.  I was quite happy with the geometry and visible viability of the conduit segment.  Again, sponge and needle counts  were correct.  Hemostasis was quite good.  Next, a quarter size diameter column of the skin and subcutaneous tissue was excised in previously marked stoma site down to the level of fascia, which was dilated to accommodate two surgeon's fingers.  Four  quadrant Vicryl sutures were placed in the level of the fascia.  The conduit was then brought through this and anchored in a quadrant fashion to prevent parastomal hernia formation and 4 rosebudding sutures were applied in a quadrant fashion, set aside.   The fascia was then reapproximated at the extraction site after placing omentum over this and closing with interrupted figure-of-eight PDS x6 followed by reapproximation of Scarpa's with running Vicryl.  All incision sites were infiltrated with dilute  lipolyzed Marcaine and closed at the level of skin using subcuticular Monocryl and Dermabond.  Final conduit maturation was performed by tying down the rosebudding sutures, which revealed an excellent rosebudding of the stoma and further down the  mucosa-to-skin reapproximation was performed using interrupted Vicryl in a quadrant  fashion.  Ostomy appliance was placed.  Procedure was terminated.  The patient tolerated the procedure well.  No immediate complications.  The patient was taken to  postanesthesia care unit in stable condition with plan for progressive care admission.  Please note, first assistant, Debbrah Alar was crucial for all portions of surgery today.  She provided invaluable retraction, suctioning, vascular clipping, vascular stapling and general first assistance.   NIK D: 05/19/2021 6:22:44 pm T: 05/20/2021 3:34:00 am  JOB: [id number]

## 2021-05-20 NOTE — Progress Notes (Signed)
Patient arrived from PACU. Aox3. Denies pain at this time. Transferred into bed. Asessed patient with off going RN and PACU RN. Noted lap sitex3. Midline incision JP to left abd. and urostomy site. Crepitus noted on right lateral chest and flank as well and bilateral groin. Pt placed on 02 [place]. Tele monitor. Confirmed with oncall resident that patient to remain NPO. Pt's mouth swabbed. Full assessment to follow.

## 2021-05-20 NOTE — Progress Notes (Addendum)
Urology Progress Note   1 Day Post-Op from robotic radical prostatectomy, bilateral pelvic lymph node dissection, ileal conduit urinary diversion.   Subjective: NAEON.  Vital signs are stable.  Patient has been up and out of bed.  Making adequate urine output.  Hemoglobin and creatinine are stable.  Objective: Vital signs in last 24 hours: Temp:  [97.4 F (36.3 C)-98.7 F (37.1 C)] 98.3 F (36.8 C) (07/23 0511) Pulse Rate:  [55-83] 82 (07/23 0511) Resp:  [10-21] 18 (07/23 0511) BP: (118-162)/(54-102) 126/84 (07/23 0511) SpO2:  [97 %-100 %] 100 % (07/22 2200)  Intake/Output from previous day: 07/22 0701 - 07/23 0700 In: 3806.2 [I.V.:3588.1; IV Piggyback:218.1] Out: 1240 [Urine:630; Drains:510; Blood:100] Intake/Output this shift: No intake/output data recorded.  Physical Exam:  General: Alert and oriented CV: Regular rate Lungs: No increased work of breathing Abdomen: Ileal conduit is pink and viable with 2 stents in place.  Soft, appropriately tender. Incisions c/d/i. JP SS GU: Ileal conduit tubing with clear pink urine output no clots. Ext: NT, No erythema  Lab Results: Recent Labs    05/18/21 1902 05/19/21 1828 05/20/21 0438  HGB 13.9 14.9 12.8*  HCT 42.6 47.6 38.4*   Recent Labs    05/18/21 1902 05/20/21 0438  NA 138 137  K 4.0 4.6  CL 103 106  CO2 27 23  GLUCOSE 104* 217*  BUN 23 20  CREATININE 0.77 1.06  CALCIUM 9.5 8.7*    Studies/Results: No results found.  Assessment/Plan:  80 y.o. male s/p robotic radical prostatectomy, pelvic lymph node dissection, ileal conduit urinary diversion.  Overall doing well post-op.   -We will start subcutaneous heparin today - Continue IV fluids maintenance dose - Ice chips today - Up and out of bed, PT evaluation - Entereg  Dispo: Floor   LOS: 1 day   The patient looks great today.  We will slowly advance his diet, starting with ice chips.

## 2021-05-20 NOTE — Evaluation (Signed)
Physical Therapy Evaluation Patient Details Name: [name]. MRN: [id number] DOB: 1941/03/11 Today's Date: 05/20/2021   History of Present Illness  Pt is an 80 y.o. male with large volume, clincal stage 3 bladder cancer, R non-complex peripelvic renal cyst s/p robotic radical prostatectomy, pelvic lymph node dissection, ileal conduit urinary diversion on 05/19/21. PMH significant for OSA, Tourettes, Obesity, CAD/Stents in 1990s  Clinical Impression  Pt is an 80 y.o. male with above HPI, s/p robotic radical prostatectomy, pelvic lymph node dissection, ileal conduit urinary diversion on 05/19/21 resulting in the deficits listed below (see PT Problem List). Pt reports that he is independent at baseline without use of assistive device. Pt currently requires MIN A-supervision for bed mobility, transfers, and ambulation. Pt ambulated ~49f with MIN guard for safety requiring cues for RW management. Pt states that he will have 24/7 supervision upon d/c. Pt will benefit from skilled PT to increase their independence and safety with mobility to allow discharge to the venue listed below.      Follow Up Recommendations Home health PT;Supervision for mobility/OOB    Equipment Recommendations  None recommended by PT (pt owns RW)    Recommendations for Other Services       Precautions / Restrictions Precautions Precautions: Fall Restrictions Weight Bearing Restrictions: No      Mobility  Bed Mobility Overal bed mobility: Needs Assistance Bed Mobility: Supine to Sit;Sit to Supine     Supine to sit: Min guard;HOB elevated Sit to supine: Supervision;HOB elevated   General bed mobility comments: MIN guard-supervisoin for supine to and from sit transfers with increased time and use of bed rails. PT provided verbal cues for log roll technique, but pt returning to supine without sidelying, able to progress LES onto bed without assist.    Transfers Overall transfer level: Needs  assistance Equipment used: Rolling walker (2 wheeled) Transfers: Sit to/from Stand Sit to Stand: Min assist;From elevated surface         General transfer comment: x2 from EOB; MIN A with multiple verbal and tactile cues for safe hand placement with increased time for successful power up  Ambulation/Gait Ambulation/Gait assistance: Min guard Gait Distance (Feet): 50 Feet Assistive device: Rolling walker (2 wheeled) Gait Pattern/deviations: Step-through pattern;Decreased stride length Gait velocity: decr   General Gait Details: MIN guard for safety with cues to maintain close proximity to RW, no LOB observed. Pt on 2L [place] at beginning of session with O2 sat at 93%, trialed pt on RA, O2 sat after ambulation 99% on portable pulse ox, 88% O2 low (difficulty obtaining reliable reading throughout session using 2 different portable monitors, suspect cold fingers? RN present intermittently throughout session). Pt placed back on 2L at EOS, RN present EOS.  Stairs            Wheelchair Mobility    Modified Rankin (Stroke Patients Only)       Balance Overall balance assessment: Needs assistance Sitting-balance support: Feet supported Sitting balance-Leahy Scale: Fair     Standing balance support: Bilateral upper extremity supported;During functional activity Standing balance-Leahy Scale: Poor Standing balance comment: use of RW to maintain balance                             Pertinent Vitals/Pain Pain Assessment: No/denies pain    Home Living Family/patient expects to be discharged to:: Private residence Living Arrangements: Spouse/significant other Available Help at Discharge: Family Type of Home: House Home Access: Stairs  to enter Entrance Stairs-Rails: Left Entrance Stairs-Number of Steps: 2 Home Layout: Two level;Able to live on main level with bedroom/bathroom Home Equipment: Grab bars - tub/shower;Walker - 2 wheels;Cane - single point;Cane -  quad Additional Comments: Pt lives with wife and has family close by. Pt states that he willhave 24/7 supervision at home upon d/c.    Prior Function Level of Independence: Independent         Comments: denies falls.     Hand Dominance   Dominant Hand: Right    Extremity/Trunk Assessment   Upper Extremity Assessment Upper Extremity Assessment: Overall WFL for tasks assessed    Lower Extremity Assessment Lower Extremity Assessment: Overall WFL for tasks assessed    Cervical / Trunk Assessment Cervical / Trunk Assessment: Kyphotic  Communication   Communication: No difficulties (consistent "gurgling?" noise noted, pt reports due to history of tourettes)  Cognition Arousal/Alertness: Awake/alert Behavior During Therapy: WFL for tasks assessed/performed Overall Cognitive Status: No family/caregiver present to determine baseline cognitive functioning                                 General Comments: pt repeating multiple statements 2-3 times to PT during session, unable to recall already sharing information and with intermittently tangential conversation, pt verbal througout entirety of session      General Comments      Exercises     Assessment/Plan    PT Assessment Patient needs continued PT services  PT Problem List Decreased activity tolerance;Decreased balance;Decreased mobility;Decreased knowledge of use of DME       PT Treatment Interventions Gait training;Stair training;Functional mobility training;Therapeutic activities;Therapeutic exercise;Patient/family education;DME instruction    PT Goals (Current goals can be found in the Care Plan section)  Acute Rehab PT Goals Patient Stated Goal: be able to walk more and go home PT Goal Formulation: With patient Time For Goal Achievement: 06/03/21 Potential to Achieve Goals: Good    Frequency Min 3X/week   Barriers to discharge        Co-evaluation               AM-PAC PT "6 Clicks"  Mobility  Outcome Measure Help needed turning from your back to your side while in a flat bed without using bedrails?: A Little Help needed moving from lying on your back to sitting on the side of a flat bed without using bedrails?: A Little Help needed moving to and from a bed to a chair (including a wheelchair)?: A Little Help needed standing up from a chair using your arms (e.g., wheelchair or bedside chair)?: A Little Help needed to walk in hospital room?: A Little Help needed climbing 3-5 steps with a railing? : A Little 6 Click Score: 18    End of Session Equipment Utilized During Treatment: Gait belt Activity Tolerance: Patient tolerated treatment well;Patient limited by fatigue Patient left: in bed;with call bell/phone within reach;with bed alarm set Nurse Communication: Mobility status PT Visit Diagnosis: Other abnormalities of gait and mobility (R26.89);Unsteadiness on feet (R26.81)    Time: [id number] PT Time Calculation (min) (ACUTE ONLY): 39 min   Charges:   PT Evaluation $PT Eval Low Complexity: 1 Low PT Treatments $Therapeutic Activity: 23-37 mins        Festus Barren PT, DPT  Acute Rehabilitation Services  Office [phone]  05/20/2021, 4:02 PM

## 2021-05-21 LAB — BASIC METABOLIC PANEL
Anion gap: 9 (ref 5–15)
BUN: 16 mg/dL (ref 8–23)
CO2: 24 mmol/L (ref 22–32)
Calcium: 8.5 mg/dL — ABNORMAL LOW (ref 8.9–10.3)
Chloride: 101 mmol/L (ref 98–111)
Creatinine, Ser: 0.93 mg/dL (ref 0.61–1.24)
GFR, Estimated: >60 mL/min (ref >60)
Glucose, Bld: 181 mg/dL — ABNORMAL HIGH (ref 70–99)
Potassium: 3.8 mmol/L (ref 3.5–5.1)
Sodium: 134 mmol/L — ABNORMAL LOW (ref 135–145)

## 2021-05-21 LAB — HEMOGLOBIN AND HEMATOCRIT, BLOOD
HCT: 33.7 % — ABNORMAL LOW (ref 39.0–52.0)
HCT: 34.3 % — ABNORMAL LOW (ref 39.0–52.0)
Hemoglobin: 11 g/dL — ABNORMAL LOW (ref 13.0–17.0)
Hemoglobin: 11.2 g/dL — ABNORMAL LOW (ref 13.0–17.0)

## 2021-05-21 MED ORDER — ADULT MULTIVITAMIN W/MINERALS CH
1.0000 | ORAL_TABLET | Freq: Every day | ORAL | Status: DC
  Administered 2021-05-21 – 2021-05-25 (×5): 1 via ORAL
  Filled 2021-05-21 (×5): qty 1

## 2021-05-21 MED ORDER — BOOST / RESOURCE BREEZE PO LIQD CUSTOM
1.0000 | Freq: Three times a day (TID) | ORAL | Status: DC
  Administered 2021-05-21 – 2021-05-25 (×8): 1 via ORAL

## 2021-05-21 MED ORDER — ACETAMINOPHEN 500 MG PO TABS
1000.0000 mg | ORAL_TABLET | Freq: Four times a day (QID) | ORAL | Status: DC
  Administered 2021-05-21 – 2021-05-25 (×15): 1000 mg via ORAL
  Filled 2021-05-21 (×17): qty 2

## 2021-05-21 NOTE — Progress Notes (Signed)
Initial Nutrition Assessment  INTERVENTION:   -Boost Breeze po TID, each supplement provides 250 kcal and 9 grams of protein  -Multivitamin with minerals daily  NUTRITION DIAGNOSIS:   Increased nutrient needs related to chronic illness as evidenced by estimated needs.  GOAL:   Patient will meet greater than or equal to 90% of their needs  MONITOR:   PO intake, Supplement acceptance, Labs, Weight trends, I & O's  REASON FOR ASSESSMENT:   Malnutrition Screening Tool    ASSESSMENT:   80 y.o. male with history of bladder Ca. 7/22: s/p robotic radical prostatectomy, bilateral pelvic lymph node dissection, ileal conduit urinary diversion.  7/21: admitted  Patient  now on clear liquids today. Has been NPO since procedure. Will order Boost Breeze to provide additional kcals and protein to aid in post-op healing.   Per weight records, pt has lost 19 lbs since February 2022 (8% wt loss x 5 months, insignificant for time frame).   Medications: Senokot, D5 infusion  Labs reviewed:  Low Na  NUTRITION - FOCUSED PHYSICAL EXAM:  Unable to complete  Diet Order:   Diet Order             Diet clear liquid Room service appropriate? Yes; Fluid consistency: Thin  Diet effective now                   EDUCATION NEEDS:      Skin:  Skin Assessment: Skin Integrity Issues: Skin Integrity Issues:: Incisions Incisions: 7/22 abdomen  Last BM:  7/22 -type 7  Height:   Ht Readings from Last 1 Encounters:  05/18/21 '5\' 10"'$  (1.778 m)    Weight:   Wt Readings from Last 1 Encounters:  05/21/21 88 kg    BMI:  Body mass index is 27.84 kg/m.  Estimated Nutritional Needs:   Kcal:  2200-2400  Protein:  100-110g  Fluid:  2L/day  Clayton Bibles, MS, RD, LDN Inpatient Clinical Dietitian Contact information available via Amion

## 2021-05-21 NOTE — Progress Notes (Signed)
Urology Progress Note   2 Days Post-Op from robotic radical prostatectomy, bilateral pelvic lymph node dissection, ileal conduit urinary diversion.   Subjective: NAEON.  Vital signs are stable.  Patient has been up and out of bed.  Worked with PT. Making adequate urine output.  Hemoglobin 11 from 12.8 on POD1, Cr stable.   Objective: Vital signs in last 24 hours: Temp:  [97.8 F (36.6 C)-99.4 F (37.4 C)] 98.4 F (36.9 C) (07/24 [id number]) Pulse Rate:  [77-93] 90 (07/24 0613) Resp:  [14-20] 16 (07/24 0613) BP: (132-152)/(78-84) 152/78 (07/24 0613) SpO2:  [95 %-100 %] 95 % (07/24 [id number]) Weight:  [88 kg] 88 kg (07/24 [id number])  Intake/Output from previous day: 07/23 0701 - 07/24 0700 In: 2083.3 [P.O.:50; I.V.:1826.5; IV Piggyback:206.8] Out: 1860 [Urine:1175; Drains:685] Intake/Output this shift: No intake/output data recorded.  Physical Exam:  General: Alert and oriented CV: Regular rate Lungs: No increased work of breathing Abdomen: Ileal conduit is pink and viable with 2 stents in place.  Soft, appropriately tender. Incisions c/d/i. JP SS GU: Ileal conduit tubing with clear pink urine output no clots. Ext: NT, No erythema  Lab Results: Recent Labs    05/19/21 1828 05/20/21 0438 05/21/21 0534  HGB 14.9 12.8* 11.0*  HCT 47.6 38.4* 33.7*    Recent Labs    05/20/21 0438 05/21/21 0534  NA 137 134*  K 4.6 3.8  CL 106 101  CO2 23 24  GLUCOSE 217* 181*  BUN 20 16  CREATININE 1.06 0.93  CALCIUM 8.7* 8.5*     Studies/Results: No results found.  Assessment/Plan:  80 y.o. male s/p robotic radical prostatectomy, pelvic lymph node dissection, ileal conduit urinary diversion.  Overall doing well post-op.   - Continue SQH DVT prophylaxis - Decrease IVF to 75 - CLDtoday - Continue ambulating - Entereg, prn analgesics  Dispo: Floor  Looks good. Still w/ increased JP output. Will advance diet, ambulate.  LOS: 2 days

## 2021-05-21 NOTE — Evaluation (Signed)
Occupational Therapy Evaluation Patient Details Name: [name]. MRN: [id number] DOB: 12-28-1940 Today's Date: 05/21/2021    History of Present Illness Pt is an 80 y.o. male with large volume, clincal stage 3 bladder cancer, R non-complex peripelvic renal cyst s/p robotic radical prostatectomy, pelvic lymph node dissection, ileal conduit urinary diversion on 05/19/21. PMH significant for OSA, Tourettes, Obesity, CAD/Stents in 1990s   Clinical Impression   Patient lives at home with spouse, has family that lives nearby that can provide assist as well. Patient independent with self care at baseline, does not use AD. Currently patient reporting lower abdominal pain limiting independence with lower body ADLs and functional transfers. Patient needing mod A for lower body dressing, set up for upper body self care and min G with walker for functional transfers/ambulation. Provide verbal cues for safe hand placement during sit to stands not to pull on walker. Recommend continued acute OT and education for compensatory strategies as needed to facilitate D/C home with family support.     Follow Up Recommendations  No OT follow up;Supervision/Assistance - 24 hour (at least initial)    Equipment Recommendations  Toilet riser;Tub/shower seat; Other (comment) (possible LH AE)       Precautions / Restrictions Precautions Precautions: Fall Precaution Comments: colostomy Restrictions Weight Bearing Restrictions: No      Mobility Bed Mobility Overal bed mobility: Needs Assistance Bed Mobility: Rolling;Sidelying to Sit Rolling: Supervision Sidelying to sit: Min guard;HOB elevated       General bed mobility comments: min G for safety due to increased effort to push up to sitting    Transfers Overall transfer level: Needs assistance Equipment used: Rolling walker (2 wheeled) Transfers: Sit to/from Stand Sit to Stand: Min guard;From elevated surface         General transfer comment:  increased difficulty trying to stand from edge of bed, increased to height of bed at home. min G with cues for hand placement not to pull on walker to stand    Balance Overall balance assessment: Needs assistance Sitting-balance support: Feet supported Sitting balance-Leahy Scale: Fair     Standing balance support: No upper extremity supported;During functional activity Standing balance-Leahy Scale: Fair Standing balance comment: can static stand for grooming tasks, min G for safety                           ADL either performed or assessed with clinical judgement   ADL Overall ADL's : Needs assistance/impaired     Grooming: Oral care;Wash/dry face;Wash/dry hands;Min guard;Standing   Upper Body Bathing: Set up;Sitting   Lower Body Bathing: Minimal assistance;Sitting/lateral leans;Sit to/from stand   Upper Body Dressing : Set up;Sitting   Lower Body Dressing: Moderate assistance;Sitting/lateral leans;Sit to/from stand Lower Body Dressing Details (indicate cue type and reason): patient able to demonstrate figure 4 method to doff R sock, increased difficulty with lateral loss of balance on bed trying to doff L sock. may benefit from initial AE use due to abdominal soreness post surgery Toilet Transfer: Min guard;Ambulation;RW Toilet Transfer Details (indicate cue type and reason): needs verbal cues not to pull on walker to stand. needs bed height elevated to power up to standing, min G for safety Toileting- Clothing Manipulation and Hygiene: Minimal assistance;Sitting/lateral lean;Sit to/from stand       Functional mobility during ADLs: Min guard;Rolling walker;Cueing for safety        Pertinent Vitals/Pain Pain Assessment: Faces Faces Pain Scale: Hurts little more Pain  Location: lower abdomen Pain Descriptors / Indicators: Sore Pain Intervention(s): Other (comment) (patient utilize call light to ask for pain medication)     Hand Dominance Right    Extremity/Trunk Assessment Upper Extremity Assessment Upper Extremity Assessment: Overall WFL for tasks assessed   Lower Extremity Assessment Lower Extremity Assessment: Defer to PT evaluation   Cervical / Trunk Assessment Cervical / Trunk Assessment: Kyphotic   Communication Communication Communication: No difficulties (history of tourettes with odd breathiness noted at times)   Cognition Arousal/Alertness: Awake/alert Behavior During Therapy: WFL for tasks assessed/performed Overall Cognitive Status: No family/caregiver present to determine baseline cognitive functioning                                 General Comments: tangential at times, otherwise appropriate              Home Living Family/patient expects to be discharged to:: Private residence Living Arrangements: Spouse/significant other Available Help at Discharge: Family Type of Home: House Home Access: Stairs to enter Technical brewer of Steps: 2 Entrance Stairs-Rails: Left Home Layout: Two level;Able to live on main level with bedroom/bathroom     Bathroom Shower/Tub: Teacher, early years/pre: Standard Bathroom Accessibility: Yes   Home Equipment: Grab bars - tub/shower;Walker - 2 wheels;Cane - single point;Cane - quad   Additional Comments: Pt lives with wife and has family close by. Pt states that he will have 24/7 supervision at home upon d/c.      Prior Functioning/Environment Level of Independence: Independent        Comments: denies falls.        OT Problem List: Decreased activity tolerance;Impaired balance (sitting and/or standing);Decreased safety awareness;Decreased knowledge of use of DME or AE;Decreased knowledge of precautions;Pain;Obesity      OT Treatment/Interventions: Self-care/ADL training;DME and/or AE instruction;Balance training;Patient/family education;Therapeutic activities    OT Goals(Current goals can be found in the care plan section) Acute  Rehab OT Goals Patient Stated Goal: be able to walk more and go home OT Goal Formulation: With patient Time For Goal Achievement: 06/04/21 Potential to Achieve Goals: Good  OT Frequency: Min 2X/week    AM-PAC OT "6 Clicks" Daily Activity     Outcome Measure Help from another person eating meals?: None Help from another person taking care of personal grooming?: A Little Help from another person toileting, which includes using toliet, bedpan, or urinal?: A Little Help from another person bathing (including washing, rinsing, drying)?: A Lot Help from another person to put on and taking off regular upper body clothing?: A Little Help from another person to put on and taking off regular lower body clothing?: A Lot 6 Click Score: 17   End of Session Equipment Utilized During Treatment: Rolling walker Nurse Communication: Mobility status  Activity Tolerance: Patient tolerated treatment well Patient left: in chair;with call bell/phone within reach;with chair alarm set  OT Visit Diagnosis: Unsteadiness on feet (R26.81);Other abnormalities of gait and mobility (R26.89);Pain Pain - part of body:  (abdomen)                Time: [id number] OT Time Calculation (min): 26 min Charges:  OT General Charges $OT Visit: 1 Visit OT Evaluation $OT Eval Low Complexity: 1 Low OT Treatments $Self Care/Home Management : 8-22 mins  Delbert Phenix OT OT pager: [place] 05/21/2021, 1:14 PM

## 2021-05-22 ENCOUNTER — Other Ambulatory Visit (HOSPITAL_COMMUNITY): Payer: Self-pay

## 2021-05-22 LAB — BASIC METABOLIC PANEL
Anion gap: 7 (ref 5–15)
BUN: 15 mg/dL (ref 8–23)
CO2: 27 mmol/L (ref 22–32)
Calcium: 8.7 mg/dL — ABNORMAL LOW (ref 8.9–10.3)
Chloride: 101 mmol/L (ref 98–111)
Creatinine, Ser: 0.85 mg/dL (ref 0.61–1.24)
GFR, Estimated: >60 mL/min (ref >60)
Glucose, Bld: 164 mg/dL — ABNORMAL HIGH (ref 70–99)
Potassium: 3.7 mmol/L (ref 3.5–5.1)
Sodium: 135 mmol/L (ref 135–145)

## 2021-05-22 LAB — HEMOGLOBIN AND HEMATOCRIT, BLOOD
HCT: 32.1 % — ABNORMAL LOW (ref 39.0–52.0)
Hemoglobin: 10.5 g/dL — ABNORMAL LOW (ref 13.0–17.0)

## 2021-05-22 NOTE — Progress Notes (Signed)
Physical Therapy Treatment Patient Details Name: [name]. MRN: [id number] DOB: April 01, 1941 Today's Date: 05/22/2021    History of Present Illness Pt is an 80 y.o. male with large volume, clincal stage 3 bladder cancer, R non-complex peripelvic renal cyst s/p robotic radical prostatectomy, pelvic lymph node dissection, ileal conduit urinary diversion on 05/19/21. PMH significant for OSA, Tourettes, Obesity, CAD/Stents in 1990s    PT Comments    Progressing with mobility.    Follow Up Recommendations  Home health PT;Supervision for mobility/OOB     Equipment Recommendations  None recommended by PT    Recommendations for Other Services       Precautions / Restrictions Precautions Precautions: Fall Precaution Comments: colostomy Restrictions Weight Bearing Restrictions: No    Mobility  Bed Mobility Overal bed mobility: Needs Assistance Bed Mobility: Supine to Sit     Supine to sit: Supervision;HOB elevated          Transfers Overall transfer level: Needs assistance Equipment used: Rolling walker (2 wheeled) Transfers: Sit to/from Stand Sit to Stand: Min guard            Ambulation/Gait Ambulation/Gait assistance: Min guard Gait Distance (Feet): 500 Feet Assistive device: Rolling walker (2 wheeled) Gait Pattern/deviations: Step-through pattern;Decreased stride length     General Gait Details: Min guard for safety. O2>90% on RA, dyspnea 2/4   Stairs             Wheelchair Mobility    Modified Rankin (Stroke Patients Only)       Balance Overall balance assessment: Needs assistance         Standing balance support: Bilateral upper extremity supported Standing balance-Leahy Scale: Fair                              Cognition Arousal/Alertness: Awake/alert Behavior During Therapy: WFL for tasks assessed/performed Overall Cognitive Status: No family/caregiver present to determine baseline cognitive functioning                                  General Comments: tangential at times, otherwise appropriate      Exercises      General Comments        Pertinent Vitals/Pain Pain Assessment: Faces Faces Pain Scale: Hurts a little bit Pain Location: lower abdomen Pain Descriptors / Indicators: Discomfort;Sore Pain Intervention(s): Monitored during session    Home Living                      Prior Function            PT Goals (current goals can now be found in the care plan section) Progress towards PT goals: Progressing toward goals    Frequency    Min 3X/week      PT Plan Current plan remains appropriate    Co-evaluation              AM-PAC PT "6 Clicks" Mobility   Outcome Measure  Help needed turning from your back to your side while in a flat bed without using bedrails?: A Little Help needed moving from lying on your back to sitting on the side of a flat bed without using bedrails?: A Little Help needed moving to and from a bed to a chair (including a wheelchair)?: A Little Help needed standing up from a chair using your arms (e.g., wheelchair or  bedside chair)?: A Little Help needed to walk in hospital room?: A Little Help needed climbing 3-5 steps with a railing? : A Little 6 Click Score: 18    End of Session   Activity Tolerance: Patient tolerated treatment well Patient left: in chair;with call bell/phone within reach;with chair alarm set;with family/visitor present   PT Visit Diagnosis: Unsteadiness on feet (R26.81)     Time: [id number] PT Time Calculation (min) (ACUTE ONLY): 20 min  Charges:  $Gait Training: 8-22 mins                         Doreatha Massed, PT Acute Rehabilitation  Office: [phone] Pager: [phone]

## 2021-05-22 NOTE — Care Management Important Message (Signed)
Important Message  Patient Details IM Letter given to the Patient. Name: [name]. MRN: [id number] Date of Birth: 1941/10/09   Medicare Important Message Given:  Yes     Kerin Salen 05/22/2021, 12:50 PM

## 2021-05-22 NOTE — Progress Notes (Addendum)
Urology Progress Note   3 Days Post-Op from robotic radical prostatectomy, bilateral pelvic lymph node dissection, ileal conduit urinary diversion.   Subjective: NAEON.  Vital signs are stable.  Continues to ambulate.  Passed gas yesterday afternoon.  Hemoglobin is 10.5 today from 11.2 yesterday.  JP is more serosanguineous in appearance from sanguinous.  His creatinine is stable at 0.85.  Adequate urine output.  Tolerating clears.  Objective: Vital signs in last 24 hours: Temp:  [97.7 F (36.5 C)-98 F (36.7 C)] 97.7 F (36.5 C) (07/24 1955) Pulse Rate:  [75-92] 75 (07/24 1955) Resp:  [16-17] 16 (07/24 1955) BP: (132-151)/(94-95) 132/95 (07/24 1955) SpO2:  [93 %-100 %] 93 % (07/24 1955)  Intake/Output from previous day: 07/24 0701 - 07/25 0700 In: 1217.6 [P.O.:410; I.V.:807.6] Out: 1260 [Urine:775; Drains:485] Intake/Output this shift: No intake/output data recorded.  Physical Exam:  General: Alert and oriented CV: Regular rate Lungs: No increased work of breathing Abdomen: Ileal conduit is pink and viable with 2 stents in place.  Soft, appropriately tender. Incisions c/d/i. JP SS GU: Ileal conduit tubing with clear pink urine output no clots. Ext: NT, No erythema  Lab Results: Recent Labs    05/21/21 0534 05/21/21 1518 05/22/21 0442  HGB 11.0* 11.2* 10.5*  HCT 33.7* 34.3* 32.1*    Recent Labs    05/21/21 0534 05/22/21 0442  NA 134* 135  K 3.8 3.7  CL 101 101  CO2 24 27  GLUCOSE 181* 164*  BUN 16 15  CREATININE 0.93 0.85  CALCIUM 8.5* 8.7*     Studies/Results: No results found.  Assessment/Plan:  80 y.o. male s/p robotic radical prostatectomy, pelvic lymph node dissection, ileal conduit urinary diversion.  Overall doing well post-op.   - Continue SQH DVT prophylaxis - Stop IV fluids today - Continue clear liquid diet for now -Wound ostomy, physical therapy, Occupational Therapy consults - Continue ambulating - Entereg, prn analgesics  Dispo:  Floor   LOS: 3 days    I have seen and examined the patient and agree with Dr. Charlean Sanfilippo plan.  Briefly,  S:  1 - Muscle Invasive Bladder Cancer - s/p robotic cystoprostatectomy with ICG sentinal + template node dissection 05/19/21. Path pending.  2 - Ileus - bowel anastamosis as lanned as part of conduit diversion 7/22. NPO initially, advanced to clears POD 2.  3 - Disposition / Rehab - independent in ADL's at baseline. PT eval 7/24 recs no needs. Ostomy RN working with in house.  O: NAD, loquatious, at baseline. NCAT Non-labored breathing RRR RLQ Urostomy pink/patent with Rt (red) and Lt (blue) bander stents and copious non-foul light pink urine. Recent port / extraction sites c/d/I. JP serosanguinous / non-foul No c/c/d  Cr 0.8, Hgb 10  A/P.  Doing VERY well POD 3 from major surgery in octagennarian. Continue ambulation, Clears until BM. Greatly appreciate ostomy RN comanaagement as well.

## 2021-05-22 NOTE — Consult Note (Signed)
[place] Nurse ostomy follow up Stoma type/location: RLQ, ileal conduit Stomal assessment/size: 1 5/8" round, budded, pink, moist with 2 stents in place  Peristomal assessment: intact  Treatment options for stomal/peristomal skin: 2" skin barrier Output bloody urine  Ostomy pouching: 1pc.flat urinary pouch with 2" skin barrier ring Education provided:  Patient lives with wife; he is primary CG for her; reports to this [place] Nurse he will be caring for himself.  Explained role of ostomy nurse and creation of stoma  Explained stoma characteristics (budded, flush, color, texture, care) Demonstrated pouch change (cutting new barrier, measuring stoma, cleaning peristomal skin and stoma, use of barrier ring) Education on emptying when 1/3 to 1/2 full and how to empty Demonstrated hooking pouch to nighttime drainage bag Discussed risk of peristomal hernia Answered patient's questions about supplies; I will mark a [place] catelog for him.  Has multiple concerns about supplies  Enrolled patient in [place] Start Discharge program: Yes  Patient had a VERY difficult time with basics for ostomy care today.  Will need HHRN for support and continued teaching especially in the presence of limited home support.   [place] Nurse will follow along with you for continued support with ostomy teaching and care [place] MSN, RN, [place], [place], [place]

## 2021-05-23 LAB — CREATININE, FLUID (PLEURAL, PERITONEAL, JP DRAINAGE): Creat, Fluid: 0.7 mg/dL

## 2021-05-23 LAB — BASIC METABOLIC PANEL
Anion gap: 5 (ref 5–15)
BUN: 17 mg/dL (ref 8–23)
CO2: 26 mmol/L (ref 22–32)
Calcium: 8.5 mg/dL — ABNORMAL LOW (ref 8.9–10.3)
Chloride: 105 mmol/L (ref 98–111)
Creatinine, Ser: 0.82 mg/dL (ref 0.61–1.24)
GFR, Estimated: >60 mL/min (ref >60)
Glucose, Bld: 136 mg/dL — ABNORMAL HIGH (ref 70–99)
Potassium: 3.9 mmol/L (ref 3.5–5.1)
Sodium: 136 mmol/L (ref 135–145)

## 2021-05-23 LAB — BPAM RBC
Blood Product Expiration Date: 202208112359
Blood Product Expiration Date: 202208112359
Unit Type and Rh: 6200
Unit Type and Rh: 6200

## 2021-05-23 LAB — HEMOGLOBIN AND HEMATOCRIT, BLOOD
HCT: 30.3 % — ABNORMAL LOW (ref 39.0–52.0)
Hemoglobin: 10 g/dL — ABNORMAL LOW (ref 13.0–17.0)

## 2021-05-23 LAB — TYPE AND SCREEN
ABO/RH(D): A POS
Antibody Screen: NEGATIVE
Unit division: 0
Unit division: 0

## 2021-05-23 LAB — GLUCOSE, CAPILLARY: Glucose-Capillary: 142 mg/dL — ABNORMAL HIGH (ref 70–99)

## 2021-05-23 NOTE — Progress Notes (Signed)
Urology Progress Note   4 Days Post-Op from robotic radical prostatectomy, bilateral pelvic lymph node dissection, ileal conduit urinary diversion.   Subjective: NAEON.  Vital signs are stable.  Continues to ambulate.  Continues to pass gas.  Hemoglobin today is 10 from 10.5 yesterday.  [place]  Serosanguineous.  His creatinine is stable.  Adequate urine output.  Tolerating clears.  Objective: Vital signs in last 24 hours: Temp:  [97.8 F (36.6 C)-98.2 F (36.8 C)] 97.8 F (36.6 C) (07/26 0506) Pulse Rate:  [66-88] 66 (07/26 0506) Resp:  [16-18] 17 (07/26 0506) BP: (129-144)/(72-86) 129/86 (07/26 0506) SpO2:  [96 %-100 %] 100 % (07/26 0506)  Intake/Output from previous day: 07/25 0701 - 07/26 0700 In: 600 [P.O.:600] Out: 1435 [Urine:950; Drains:485] Intake/Output this shift: No intake/output data recorded.  Physical Exam:  General: Alert and oriented CV: Regular rate Lungs: No increased work of breathing Abdomen: Ileal conduit is pink and viable with 2 stents in place.  Soft, appropriately tender. Incisions c/d/i. JP SS GU: Ileal conduit tubing with clear pink urine output no clots. Ext: NT, No erythema  Lab Results: Recent Labs    05/21/21 1518 05/22/21 0442 05/23/21 0509  HGB 11.2* 10.5* 10.0*  HCT 34.3* 32.1* 30.3*    Recent Labs    05/22/21 0442 05/23/21 0509  NA 135 136  K 3.7 3.9  CL 101 105  CO2 27 26  GLUCOSE 164* 136*  BUN 15 17  CREATININE 0.85 0.82  CALCIUM 8.7* 8.5*     Studies/Results: No results found.  Assessment/Plan:  80 y.o. male s/p robotic radical prostatectomy, pelvic lymph node dissection, ileal conduit urinary diversion.  Overall doing well post-op.   -We will collect a JP creatinine today - Continue SQH DVT prophylaxis -Advance diet as tolerated -Wound ostomy, physical therapy, Occupational Therapy consults - Continue ambulating - Entereg, prn analgesics  Dispo: Floor   LOS: 4 days

## 2021-05-23 NOTE — Progress Notes (Signed)
Occupational Therapy Treatment Patient Details Name: [name]. MRN: [id number] DOB: 1941/09/06 Today's Date: 05/23/2021    History of present illness Pt is an 80 y.o. male with large volume, clincal stage 3 bladder cancer, R non-complex peripelvic renal cyst s/p robotic radical prostatectomy, pelvic lymph node dissection, ileal conduit urinary diversion on 05/19/21. PMH significant for OSA, Tourettes, Obesity, CAD/Stents in 1990s   OT comments  Patient was lying ib ed at start of session. Patient required min guard fro all functional mobility with education to keep walker on ground multiple times during session. Patient completed toileting tasks after episode of incontinence with min A for hygiene tasks. Patient was able to complete washing hands and oral care standing at sink with education on proper placement of rolling walker. Patient was able to participate in functional mobility for over 10 mins to increase functional activity tolerance. Patient tolerated session well. D/c plans remain appropriate.   Follow Up Recommendations  No OT follow up;Supervision/Assistance - 24 hour    Equipment Recommendations  Other (comment);Toilet riser;Tub/shower seat    Recommendations for Other Services      Precautions / Restrictions Precautions Precautions: Fall Precaution Comments: jp drain Restrictions Weight Bearing Restrictions: No       Mobility Bed Mobility Overal bed mobility: Needs Assistance Bed Mobility: Supine to Sit Rolling: Supervision   Supine to sit: Supervision;HOB elevated     General bed mobility comments: min G for safety due to increased effort to push up to sitting    Transfers Overall transfer level: Needs assistance Equipment used: Rolling walker (2 wheeled) Transfers: Sit to/from Stand Sit to Stand: Min guard         General transfer comment: patient required min G for sit to stand with increased push to stand upright.    Balance                                            ADL either performed or assessed with clinical judgement   ADL       Grooming: Oral care;Wash/dry face;Wash/dry hands;Min Dispensing optician: Min guard;Ambulation;RW   Toileting- Clothing Manipulation and Hygiene: Minimal assistance;Sit to/from stand       Functional mobility during ADLs: Min guard;Rolling walker;Cueing for safety General ADL Comments: patient was noted to pick up walker in the air and attempt to walk with it multiple times in session. patient was educated on importance of keeping walker on ground for standing balance. patient verbalized understanding. patient required repeat education on lifting precautions during session to keep walker on ground. patient completed over 10 mins of functional mobility with education on ECT. patient verbalized understanding.     Vision       Perception     Praxis      Cognition Arousal/Alertness: Awake/alert Behavior During Therapy: WFL for tasks assessed/performed Overall Cognitive Status: No family/caregiver present to determine baseline cognitive functioning                                          Exercises     Shoulder Instructions       General Comments      Pertinent Vitals/ Pain  Pain Assessment: Faces Faces Pain Scale: Hurts a little bit Pain Location: lower abdomen Pain Descriptors / Indicators: Discomfort;Sore Pain Intervention(s): Monitored during session  Home Living                                          Prior Functioning/Environment              Frequency  Min 2X/week        Progress Toward Goals  OT Goals(current goals can now be found in the care plan section)  Progress towards OT goals: Progressing toward goals  Acute Rehab OT Goals Patient Stated Goal: to be able to go home OT Goal Formulation: With patient Time For Goal Achievement:  06/04/21 Potential to Achieve Goals: Good  Plan Discharge plan remains appropriate    Co-evaluation                 AM-PAC OT "6 Clicks" Daily Activity     Outcome Measure   Help from another person eating meals?: None Help from another person taking care of personal grooming?: A Little Help from another person toileting, which includes using toliet, bedpan, or urinal?: A Little Help from another person bathing (including washing, rinsing, drying)?: A Lot Help from another person to put on and taking off regular upper body clothing?: A Little Help from another person to put on and taking off regular lower body clothing?: A Lot 6 Click Score: 17    End of Session Equipment Utilized During Treatment: Rolling walker  OT Visit Diagnosis: Unsteadiness on feet (R26.81);Other abnormalities of gait and mobility (R26.89);Pain   Activity Tolerance Patient tolerated treatment well   Patient Left in bed;with bed alarm set;with call bell/phone within reach   Nurse Communication Mobility status        Time: [id number] OT Time Calculation (min): 32 min  Charges: OT General Charges $OT Visit: 1 Visit OT Treatments $Self Care/Home Management : 23-37 mins  Jackelyn Poling OTR/L, MS Acute Rehabilitation Department Office# [phone] Pager# [phone]    [place] 05/23/2021, 12:12 PM

## 2021-05-24 DIAGNOSIS — C679 Malignant neoplasm of bladder, unspecified: Secondary | ICD-10-CM | POA: Diagnosis not present

## 2021-05-24 DIAGNOSIS — G4733 Obstructive sleep apnea (adult) (pediatric): Secondary | ICD-10-CM | POA: Diagnosis not present

## 2021-05-24 DIAGNOSIS — C678 Malignant neoplasm of overlapping sites of bladder: Secondary | ICD-10-CM | POA: Diagnosis not present

## 2021-05-24 LAB — BASIC METABOLIC PANEL
Anion gap: 10 (ref 5–15)
BUN: 21 mg/dL (ref 8–23)
CO2: 27 mmol/L (ref 22–32)
Calcium: 9 mg/dL (ref 8.9–10.3)
Chloride: 102 mmol/L (ref 98–111)
Creatinine, Ser: 0.78 mg/dL (ref 0.61–1.24)
GFR, Estimated: >60 mL/min (ref >60)
Glucose, Bld: 143 mg/dL — ABNORMAL HIGH (ref 70–99)
Potassium: 3.6 mmol/L (ref 3.5–5.1)
Sodium: 139 mmol/L (ref 135–145)

## 2021-05-24 LAB — SURGICAL PATHOLOGY

## 2021-05-24 LAB — HEMOGLOBIN AND HEMATOCRIT, BLOOD
HCT: 34 % — ABNORMAL LOW (ref 39.0–52.0)
Hemoglobin: 11 g/dL — ABNORMAL LOW (ref 13.0–17.0)

## 2021-05-24 NOTE — Progress Notes (Signed)
Physical Therapy Treatment Patient Details Name: [name]. MRN: [id number] DOB: [birth year] Today's Date: 05/24/2021    History of Present Illness Pt is an 80 y.o. male with large volume, clincal stage 3 bladder cancer, R non-complex peripelvic renal cyst s/p robotic radical prostatectomy, pelvic lymph node dissection, ileal conduit urinary diversion on 05/19/21. PMH significant for OSA, Tourettes, Obesity, CAD/Stents in 1990s    PT Comments    Patient agreeable to mobilize with therapy and required supervision/min guard for safety with transfers using RW. Pt required min guard for gait and stair training this session and pt's son present and demonstrated safe guarding during ambulation with cues from therapist. Acute PT will continue to progress pt as able.    Follow Up Recommendations  Home health PT;Supervision for mobility/OOB     Equipment Recommendations  None recommended by PT    Recommendations for Other Services       Precautions / Restrictions Precautions Precautions: Fall Precaution Comments: jp drain Restrictions Weight Bearing Restrictions: No    Mobility  Bed Mobility Overal bed mobility: Needs Assistance Bed Mobility: Supine to Sit     Supine to sit: Supervision;HOB elevated     General bed mobility comments: sueprvision for safety and cues to use bed rail to pivot to EOB.    Transfers Overall transfer level: Needs assistance Equipment used: Rolling walker (2 wheeled) Transfers: Sit to/from Stand Sit to Stand: Min guard         General transfer comment: cues for safe hand placement, guarding for safety with hand placement  Ambulation/Gait Ambulation/Gait assistance: Min guard Gait Distance (Feet): 300 Feet Assistive device: Rolling walker (2 wheeled) Gait Pattern/deviations: Step-through pattern;Decreased stride length Gait velocity: decr   General Gait Details: close guarding for safety and cues to maintain safe proximity to RW.  Pt's son provided guarding for last ~150' of gait with supervision and cues from therapist.   Stairs Stairs: Yes Stairs assistance: Min guard Stair Management: One rail Left;Step to pattern;Sideways Number of Stairs: 3 General stair comments: cues for sequencing side step pattern with single rail. educated pt/son on safe guarding for family to provide while pt ascend/descend steps.   Wheelchair Mobility    Modified Rankin (Stroke Patients Only)       Balance Overall balance assessment: Needs assistance Sitting-balance support: Feet supported Sitting balance-Leahy Scale: Fair     Standing balance support: Bilateral upper extremity supported Standing balance-Leahy Scale: Fair                              Cognition Arousal/Alertness: Awake/alert Behavior During Therapy: WFL for tasks assessed/performed Overall Cognitive Status: No family/caregiver present to determine baseline cognitive functioning                                 General Comments: tangential at times, otherwise appropriate and very pleasant      Exercises      General Comments        Pertinent Vitals/Pain Pain Assessment: No/denies pain    Home Living                      Prior Function            PT Goals (current goals can now be found in the care plan section) Acute Rehab PT Goals Patient Stated Goal: to be able to  go home PT Goal Formulation: With patient Time For Goal Achievement: 06/03/21 Potential to Achieve Goals: Good Progress towards PT goals: Progressing toward goals    Frequency    Min 3X/week      PT Plan Current plan remains appropriate    Co-evaluation              AM-PAC PT "6 Clicks" Mobility   Outcome Measure  Help needed turning from your back to your side while in a flat bed without using bedrails?: A Little Help needed moving from lying on your back to sitting on the side of a flat bed without using bedrails?: A  Little Help needed moving to and from a bed to a chair (including a wheelchair)?: A Little Help needed standing up from a chair using your arms (e.g., wheelchair or bedside chair)?: A Little Help needed to walk in hospital room?: A Little Help needed climbing 3-5 steps with a railing? : A Little 6 Click Score: 18    End of Session Equipment Utilized During Treatment: Gait belt Activity Tolerance: Patient tolerated treatment well Patient left: in chair;with call bell/phone within reach;with chair alarm set;with family/visitor present Nurse Communication: Mobility status PT Visit Diagnosis: Unsteadiness on feet (R26.81)     Time: [id number] PT Time Calculation (min) (ACUTE ONLY): 25 min  Charges:  $Gait Training: 23-37 mins                     Verner Mould, DPT Acute Rehabilitation Services Office [phone] Pager [phone]    [name] 05/24/2021, 3:56 PM

## 2021-05-24 NOTE — TOC Progression Note (Signed)
Transition of Care (TOC) - Progression Note    Patient Details  Name: [name]. MRN: [id number] Date of Birth: 1941/01/16  Transition of Care [facility]) CM/SW Contact  Ross Ludwig, [place] Phone Number: 05/24/2021, 2:59 PM  Clinical Narrative:    CSW spoke to [facility] and [facility] confirmed they can still accept patient.  CSW to continue to follow patient's progress throughout discharge planning.   Expected Discharge Plan: [place] Barriers to Discharge: Continued Medical Work up  Expected Discharge Plan and Services Expected Discharge Plan: [place]   Discharge Planning Services: CM Consult Post Acute Care Choice: Durable Medical Equipment, Home Health Living arrangements for the past 2 months: Single Family Home                 DME Arranged: Shower stool DME Agency: [employer] Date DME Agency Contacted: 05/24/21 Time DME Agency Contacted: [phone] Representative spoke with at DME Agency: Jodell Cipro HH Arranged: RN, PT [place] Agency: [place] Date [place]: 05/24/21 Time [place]: [phone] Representative spoke with at [place]: [place] ([place]) Interventions    Readmission Risk Interventions No flowsheet data found.

## 2021-05-24 NOTE — Progress Notes (Signed)
Pharmacy Brief Note - Alvimopan (Entereg)  The standing order set for alvimopan (Entereg) now includes an automatic order to discontinue the drug after the patient has had a bowel movement. The change was approved by the [place] and the Medical Executive Committee.   This patient has had bowel movements documented by nursing. Therefore, alvimopan has been discontinued. If there are questions, please contact the pharmacy at [phone].   Thank you-   Minda Ditto PharmD [facility] [phone] 05/24/2021, 9:02 AM

## 2021-05-24 NOTE — Consult Note (Signed)
[place] Nurse ostomy consult note Stoma type/location: RLQ, ileal conduit Stomal assessment/size: 1 5/8" round, budded, pink, moist. 2 stents in place (red and blue) Peristomal assessment: intact, mild redness noted from 2-4 o'clock, pouch was intact, not leaking at this site  Treatment options for stomal/peristomal skin: 2" skin barrier ring Output yellow urine Ostomy pouching: 1pc.flat with 2" skin barrier ring Education provided:  Patient's son at bedside.  Patient able to verbalize back to [facility] nurse some of the teaching from Monday but he is quite impulsive and has some issues with his dexterity to perform skills Son at beside to observe pouch change.  With some assistance and patience patient able to disconnect from BSD and reconnect.  Able to open and close spigot.  Concerned again today on the quantity of urine the pouch can hold wanting to have leg bag to be able to travel out to his favorite restaurant.  Demonstrated pouch change to son.  I don't think at this time patient can perform pouch change without assistance. Very impulsive with care; touching stents and his stoma. 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 Suggested after about 3 months to convert to pre-cut pouches and rationale for why he should wait. Son and patient ask good appropriate questions. Discussed stents with son and explained anatomically the reasons for them.  Placed new 1pc flat with 2" skin barrier ring. Fearful when stents are removed patient will not be able to change pouch quickly enough to keep skin dry between pouches. Will need support from sons for this. Was not able to instruct on the use of wick; this concept seemed to alarm both the patient and the son. Discussed ways to clean BSD bag.   Obtained leg bag and demonstrated ways to hook this to the current ostomy pouch because patient is quite adamant on ways to increase the current capacity of his pouch.  Patient mentions use of pliers more than once to open and close pouch because of dexterity issues.  Son (not the one present) assisted previously with foley catheter and hooking this to BSD will be available and possibly more inclined to understand care needed.   5 flat 1pc pouches/5 barrier rings/new BSD bag/leg bag/ pattern for CTF pouches in the room for DC  Extra educational supplies provided for son [name] in the room and his siblings.  Provided ostomy nurse contact e-mail for questions.   Will follow up tomorrow with patient.   Over 1.75 hours spent with patient and son today.  Enrolled patient in [place] program: Yes/No   [place] Nurse will follow along with you for continued support with ostomy teaching and care [place] MSN, [place], [place], [place], [place]

## 2021-05-24 NOTE — TOC Initial Note (Signed)
Transition of Care (TOC) - Initial/Assessment Note    Patient Details  Name: [name]. MRN: [id number] Date of Birth: [birth year]  Transition of Care [facility]) CM/SW Contact:    Dessa Phi, RN Phone Number: 05/24/2021, 9:14 AM  Clinical Narrative:Spoke to son Sayf about d/c plans-d/c home. [place] rep [facility] able to accept for HHPT currently-check on HHRN-ostomy teaching-awaiting a response from [place]. [employer] dme for shower stool to deliver to rm prior d/c.                   Expected Discharge Plan: [place] Barriers to Discharge: Continued Medical Work up   Patient Goals and CMS Choice Patient states their goals for this hospitalization and ongoing recovery are:: go home CMS Medicare.gov Compare Post Acute Care list provided to:: Patient Represenative (must comment) Choice offered to / list presented to : Adult Children  Expected Discharge Plan and Services Expected Discharge Plan: [place]   Discharge Planning Services: CM Consult Post Acute Care Choice: Durable Medical Equipment, Home Health Living arrangements for the past 2 months: [place]                 DME Arranged: Shower stool DME Agency: [employer] Date DME Agency Contacted: 05/24/21 Time DME Agency Contacted: [phone] Representative spoke with at DME Agency: Jodell Cipro HH Arranged: RN, PT [place] Agency: [place] Date [place]: 05/24/21 Time [place]: [phone] Representative spoke with at [place]: [place] Arrangements/Services Living arrangements for the past 2 months: [place] Lives with:: Spouse Patient language and need for interpreter reviewed:: Yes Do you feel safe going back to the place where you live?: Yes      Need for Family Participation in Patient Care: No (Comment) Care giver support system in place?: Yes (comment) Current home services: DME (rw) Criminal Activity/Legal  Involvement Pertinent to Current Situation/Hospitalization: No - Comment as needed  Activities of Daily Living Home Assistive Devices/Equipment: Eyeglasses (reading glasses) ADL Screening (condition at time of admission) Patient's cognitive ability adequate to safely complete daily activities?: Yes Is the patient deaf or have difficulty hearing?: No Does the patient have difficulty seeing, even when wearing glasses/contacts?: Yes ("eyes have been off a little today", has cataract on right eye) Does the patient have difficulty concentrating, remembering, or making decisions?: No Patient able to express need for assistance with ADLs?: Yes Does the patient have difficulty dressing or bathing?: No Independently performs ADLs?: Yes (appropriate for developmental age) Does the patient have difficulty walking or climbing stairs?: No Weakness of Legs: None Weakness of Arms/Hands: None  Permission Sought/Granted Permission sought to share information with : Case Manager Permission granted to share information with : Yes, Verbal Permission Granted  Share Information with NAME: Case Manager     Permission granted to share info w Relationship: [place] son [phone]     Emotional Assessment Appearance:: Appears stated age Attitude/Demeanor/Rapport: Gracious Affect (typically observed): Accepting Orientation: : Oriented to Self, Oriented to Place, Oriented to  Time Alcohol / Substance Use: Not Applicable Psych Involvement: No (comment)  Admission diagnosis:  Bladder cancer [facility]) [C67.9] Patient Active Problem List   Diagnosis Date Noted   Bladder cancer ([place]) 05/19/2021   Malignant neoplasm of overlapping sites of bladder ([place]) 01/17/2021   OSA (obstructive sleep apnea) 08/05/2013   Tourette's syndrome 04/01/2013   PCP:  Celene Squibb, MD Pharmacy:   [facility],  [place] - [place] [address] Phone: [phone] Fax:  [phone]  [id number] [place] [address]: [phone]: [phone] [facility] [address]Phone: [phone]: [phone] [place]: [phone]: [phone]    Social Determinants of Health (SDOH) Interventions    Readmission Risk Interventions No flowsheet data found.

## 2021-05-24 NOTE — Progress Notes (Signed)
Urology Progress Note   5 Days Post-Op from robotic radical prostatectomy, bilateral pelvic lymph node dissection, ileal conduit urinary diversion.   Subjective: NAEON.  Vital signs are stable.  Continues to ambulate.  Continues to pass gas and had bowel movements yesterday.  Hemoglobin stable at 11.  585 JP output.  JP creatinine yesterday was 0.7 equal to serum creatinine.  Making adequate urine output.  Mildly tachycardic to 105 this morning.  We will recheck vitals and continue to monitor.  Tolerating regular diet.  Had a ham tenders for dinner.  Objective: Vital signs in last 24 hours: Temp:  [98.3 F (36.8 C)-99.2 F (37.3 C)] 99.2 F (37.3 C) (07/27 0644) Pulse Rate:  [89-105] 105 (07/27 0644) Resp:  [18-20] 20 (07/27 0644) BP: (138-140)/(65-91) 139/78 (07/27 0644) SpO2:  [93 %-100 %] 100 % (07/27 0644)  Intake/Output from previous day: 07/26 0701 - 07/27 0700 In: 720 [P.O.:720] Out: 1745 [Urine:1150; Drains:595] Intake/Output this shift: No intake/output data recorded.  Physical Exam:  General: Alert and oriented CV: Regular rate Lungs: No increased work of breathing Abdomen: Ileal conduit is pink and viable with 2 stents in place.  Soft, appropriately tender. Incisions c/d/i. JP SS GU: Ileal conduit tubing with clear urine output no clots. Ext: NT, No erythema  Lab Results: Recent Labs    05/22/21 0442 05/23/21 0509 05/24/21 0444  HGB 10.5* 10.0* 11.0*  HCT 32.1* 30.3* 34.0*    Recent Labs    05/23/21 0509 05/24/21 0444  NA 136 139  K 3.9 3.6  CL 105 102  CO2 26 27  GLUCOSE 136* 143*  BUN 17 21  CREATININE 0.82 0.78  CALCIUM 8.5* 9.0     Studies/Results: No results found.  Assessment/Plan:  80 y.o. male s/p robotic radical prostatectomy, pelvic lymph node dissection, ileal conduit urinary diversion.  Overall doing well post-op.   - Continue SQH DVT prophylaxis -Continue regular diet -Wound ostomy, physical therapy, Occupational Therapy  consults - Continue ambulating - Entereg, prn analgesics Will arrange home health nursing for continued ostomy teaching  Dispo: Floor.  Potentially home tomorrow.   LOS: 5 days

## 2021-05-25 ENCOUNTER — Other Ambulatory Visit (HOSPITAL_COMMUNITY): Payer: Self-pay

## 2021-05-25 MED ORDER — TRAMADOL HCL 50 MG PO TABS
50.0000 mg | ORAL_TABLET | Freq: Four times a day (QID) | ORAL | 0 refills | Status: AC | PRN
  Filled 2021-05-25: qty 20, 3d supply, fill #0

## 2021-05-25 NOTE — Progress Notes (Signed)
Discharge paperwork given and explained to patient and family at bedside utilizing the Teachback method. PIV removed. JP drain removed. Pressure dressings applied. Pt awaiting transport.

## 2021-05-25 NOTE — Plan of Care (Signed)
  Problem: Education: Goal: Knowledge of General Education information will improve Description: Including pain rating scale, medication(s)/side effects and non-pharmacologic comfort measures Outcome: Adequate for Discharge   Problem: Health Behavior/Discharge Planning: Goal: Ability to manage health-related needs will improve Outcome: Adequate for Discharge   Problem: Clinical Measurements: Goal: Ability to maintain clinical measurements within normal limits will improve Outcome: Adequate for Discharge Goal: Will remain free from infection Outcome: Adequate for Discharge Goal: Diagnostic test results will improve Outcome: Adequate for Discharge Goal: Respiratory complications will improve Outcome: Adequate for Discharge Goal: Cardiovascular complication will be avoided Outcome: Adequate for Discharge   Problem: Activity: Goal: Risk for activity intolerance will decrease Outcome: Adequate for Discharge   Problem: Nutrition: Goal: Adequate nutrition will be maintained Outcome: Adequate for Discharge   Problem: Coping: Goal: Level of anxiety will decrease Outcome: Adequate for Discharge   Problem: Elimination: Goal: Will not experience complications related to bowel motility Outcome: Adequate for Discharge Goal: Will not experience complications related to urinary retention Outcome: Adequate for Discharge   Problem: Safety: Goal: Ability to remain free from injury will improve Outcome: Adequate for Discharge   Problem: Skin Integrity: Goal: Risk for impaired skin integrity will decrease Outcome: Adequate for Discharge   

## 2021-05-25 NOTE — TOC Transition Note (Signed)
Transition of Care [facility]) - CM/SW Discharge Note   Patient Details  Name: [name]. MRN: [id number] Date of Birth: 1940/11/27  Transition of Care [facility]) CM/SW Contact:  Ross Ludwig, LCSW Phone Number: 05/25/2021, 12:49 PM   Clinical Narrative:     Patient will be going home with home health through [place].  CSW signing off please reconsult with any other social work needs, home health agency has been notified of planned discharge.  Patient's son will be transporting patient home today.   Final next level of care: [place] Barriers to Discharge: Barriers Resolved   Patient Goals and CMS Choice Patient states their goals for this hospitalization and ongoing recovery are:: To return home with home health. CMS Medicare.gov Compare Post Acute Care list provided to:: Patient Choice offered to / list presented to : Adult Children  Discharge Placement                       Discharge Plan and Services   Discharge Planning Services: CM Consult Post Acute Care Choice: Durable Medical Equipment, Home Health          DME Arranged: Shower stool DME Agency: [employer] Date DME Agency Contacted: 05/23/21 Time DME Agency Contacted: [phone] Representative spoke with at DME Agency: Jodell [facility] HH Arranged: PT, RN [place] Agency: [place] Date [place]: 05/25/21 Time [place]: [id number] Representative spoke with at [place]: [place] ([place]) Interventions     Readmission Risk Interventions No flowsheet data found.

## 2021-05-25 NOTE — Discharge Summary (Signed)
[facility] Discharge Summary  Admit date: 05/18/2021  Discharge date and time: 05/25/21   Discharge to: Home  Discharge Service: Urology  Discharge Attending Physician:  Phebe Colla, MD  Discharge  Diagnoses: Bladder Cancer  Secondary Diagnosis: Active Problems:   Bladder cancer ([place])   OR Procedures: Procedure(s): XI ROBOTIC ASSISTED LAPAROSCOPIC COMPLETE CYSTECT ILEAL CONDUIT XI ROBOTIC ASSISTED LAPAROSCOPIC RADICAL PROSTATECTOMY LYMPH NODE DISSECTION CYSTOSCOPY WITH INJECTION OF INDOCYANINE GREEN DYE 05/19/2021   Ancillary Procedures: None   Discharge Day Services: The patient was seen and examined by the Urology team both in the morning and immediately prior to discharge.  Vital signs and laboratory values were stable and within normal limits.  The physical exam was benign and unchanged and all surgical wounds were examined.  Discharge instructions were explained and all questions answered.  Subjective  No acute events overnight. Pain Controlled. No fever or chills.  Objective Patient Vitals for the past 8 hrs:  BP Temp Pulse Resp SpO2  05/25/21 0530 139/71 97.9 F (36.6 C) (!) 104 18 94 %   No intake/output data recorded.  General Appearance:        No acute distress Lungs:                       Normal work of breathing on room air Heart:                                Regular rate and rhythm Abdomen:                         Soft, non-tender, non-distended. Ileal conduit pink and viable with two stents in place. JP serosanguinous.  Extremities:                      Warm and well perfused   Hospital Course:  The patient underwent Robotic Radical Cystoprostatectomy with Bilateral pelvic lymph node dissection and ileal conduit urinary diversion on 05/19/2021.  The patient tolerated the procedure well, was extubated in the OR, and afterwards was taken to the PACU for routine post-surgical care. When stable the patient was transferred to the floor.   The patient did  well postoperatively.  The patient's diet was slowly advanced and at the time of discharge was tolerating a regular diet.  The patient was discharged home 6 Days Post-Op, at which point was tolerating a regular solid diet, was able to void spontaneously, have adequate pain control with P.O. pain medication, and could ambulate without difficulty. Hishospital stay was prolonged to POD6 as he was awaiting return of bowel function and undergoing ostomy teaching. The patient will follow up with [place] for post op check.   Discharging with HHPT and [place] for ostomy care.   He was discharged with two ureteral stents in place.   Condition at Discharge: Improved  Discharge Medications:  Allergies as of 05/25/2021   No Known Allergies      Medication List     TAKE these medications    aspirin 81 MG EC tablet Take 81 mg by mouth daily. Swallow whole.   cetirizine 10 MG tablet Commonly known as: ZYRTEC Take 10 mg by mouth at bedtime.   CoQ-10 100 MG Caps Take 100 mg by mouth 2 (two) times daily.   guanFACINE 1 MG tablet Commonly known as: TENEX Take 2 tablets (2 mg total) by  mouth at bedtime.   lisinopril 2.5 MG tablet Commonly known as: ZESTRIL Take 2.5 mg by mouth in the morning.   metoprolol succinate 25 MG 24 hr tablet Commonly known as: TOPROL-XL Take 25 mg by mouth in the morning and at bedtime.   simvastatin 20 MG tablet Commonly known as: ZOCOR Take 20 mg by mouth at bedtime.   traMADol 50 MG tablet Commonly known as: Ultram Take 1 - 2 tablets by mouth every 6 hours as needed for moderate to severe pain (post-operatively).               Durable Medical Equipment  (From admission, onward)           Start     Ordered   05/24/21 0912  For home use only DME Shower stool  Once        05/24/21 0911

## 2021-05-26 ENCOUNTER — Other Ambulatory Visit (HOSPITAL_COMMUNITY): Payer: Self-pay

## 2021-05-26 DIAGNOSIS — E669 Obesity, unspecified: Secondary | ICD-10-CM | POA: Diagnosis not present

## 2021-05-26 DIAGNOSIS — I25119 Atherosclerotic heart disease of native coronary artery with unspecified angina pectoris: Secondary | ICD-10-CM | POA: Diagnosis not present

## 2021-05-26 DIAGNOSIS — Z6827 Body mass index (BMI) 27.0-27.9, adult: Secondary | ICD-10-CM | POA: Diagnosis not present

## 2021-05-26 DIAGNOSIS — C678 Malignant neoplasm of overlapping sites of bladder: Secondary | ICD-10-CM | POA: Diagnosis not present

## 2021-05-26 DIAGNOSIS — I10 Essential (primary) hypertension: Secondary | ICD-10-CM | POA: Diagnosis not present

## 2021-05-26 DIAGNOSIS — C679 Malignant neoplasm of bladder, unspecified: Secondary | ICD-10-CM | POA: Diagnosis not present

## 2021-05-26 DIAGNOSIS — Z906 Acquired absence of other parts of urinary tract: Secondary | ICD-10-CM | POA: Diagnosis not present

## 2021-05-26 DIAGNOSIS — Z483 Aftercare following surgery for neoplasm: Secondary | ICD-10-CM | POA: Diagnosis not present

## 2021-05-26 DIAGNOSIS — Z436 Encounter for attention to other artificial openings of urinary tract: Secondary | ICD-10-CM | POA: Diagnosis not present

## 2021-05-26 DIAGNOSIS — Z9181 History of falling: Secondary | ICD-10-CM | POA: Diagnosis not present

## 2021-05-30 DIAGNOSIS — Z483 Aftercare following surgery for neoplasm: Secondary | ICD-10-CM | POA: Diagnosis not present

## 2021-05-30 DIAGNOSIS — I10 Essential (primary) hypertension: Secondary | ICD-10-CM | POA: Diagnosis not present

## 2021-05-30 DIAGNOSIS — C678 Malignant neoplasm of overlapping sites of bladder: Secondary | ICD-10-CM | POA: Diagnosis not present

## 2021-05-30 DIAGNOSIS — I25119 Atherosclerotic heart disease of native coronary artery with unspecified angina pectoris: Secondary | ICD-10-CM | POA: Diagnosis not present

## 2021-05-30 DIAGNOSIS — Z9181 History of falling: Secondary | ICD-10-CM | POA: Diagnosis not present

## 2021-05-30 DIAGNOSIS — E669 Obesity, unspecified: Secondary | ICD-10-CM | POA: Diagnosis not present

## 2021-05-30 DIAGNOSIS — Z906 Acquired absence of other parts of urinary tract: Secondary | ICD-10-CM | POA: Diagnosis not present

## 2021-05-30 DIAGNOSIS — Z436 Encounter for attention to other artificial openings of urinary tract: Secondary | ICD-10-CM | POA: Diagnosis not present

## 2021-05-30 DIAGNOSIS — Z6827 Body mass index (BMI) 27.0-27.9, adult: Secondary | ICD-10-CM | POA: Diagnosis not present

## 2021-05-31 DIAGNOSIS — E669 Obesity, unspecified: Secondary | ICD-10-CM | POA: Diagnosis not present

## 2021-05-31 DIAGNOSIS — C678 Malignant neoplasm of overlapping sites of bladder: Secondary | ICD-10-CM | POA: Diagnosis not present

## 2021-05-31 DIAGNOSIS — Z9181 History of falling: Secondary | ICD-10-CM | POA: Diagnosis not present

## 2021-05-31 DIAGNOSIS — Z6827 Body mass index (BMI) 27.0-27.9, adult: Secondary | ICD-10-CM | POA: Diagnosis not present

## 2021-05-31 DIAGNOSIS — I10 Essential (primary) hypertension: Secondary | ICD-10-CM | POA: Diagnosis not present

## 2021-05-31 DIAGNOSIS — Z906 Acquired absence of other parts of urinary tract: Secondary | ICD-10-CM | POA: Diagnosis not present

## 2021-05-31 DIAGNOSIS — Z483 Aftercare following surgery for neoplasm: Secondary | ICD-10-CM | POA: Diagnosis not present

## 2021-05-31 DIAGNOSIS — I25119 Atherosclerotic heart disease of native coronary artery with unspecified angina pectoris: Secondary | ICD-10-CM | POA: Diagnosis not present

## 2021-05-31 DIAGNOSIS — Z436 Encounter for attention to other artificial openings of urinary tract: Secondary | ICD-10-CM | POA: Diagnosis not present

## 2021-06-01 ENCOUNTER — Encounter: Payer: Self-pay | Admitting: Urology

## 2021-06-01 ENCOUNTER — Other Ambulatory Visit: Payer: Self-pay | Admitting: Urology

## 2021-06-01 DIAGNOSIS — Z436 Encounter for attention to other artificial openings of urinary tract: Secondary | ICD-10-CM | POA: Diagnosis not present

## 2021-06-01 DIAGNOSIS — Z6827 Body mass index (BMI) 27.0-27.9, adult: Secondary | ICD-10-CM | POA: Diagnosis not present

## 2021-06-01 DIAGNOSIS — I25119 Atherosclerotic heart disease of native coronary artery with unspecified angina pectoris: Secondary | ICD-10-CM | POA: Diagnosis not present

## 2021-06-01 DIAGNOSIS — Z9181 History of falling: Secondary | ICD-10-CM | POA: Diagnosis not present

## 2021-06-01 DIAGNOSIS — Z483 Aftercare following surgery for neoplasm: Secondary | ICD-10-CM | POA: Diagnosis not present

## 2021-06-01 DIAGNOSIS — C678 Malignant neoplasm of overlapping sites of bladder: Secondary | ICD-10-CM | POA: Diagnosis not present

## 2021-06-01 DIAGNOSIS — Z906 Acquired absence of other parts of urinary tract: Secondary | ICD-10-CM | POA: Diagnosis not present

## 2021-06-01 DIAGNOSIS — R110 Nausea: Secondary | ICD-10-CM

## 2021-06-01 DIAGNOSIS — E669 Obesity, unspecified: Secondary | ICD-10-CM | POA: Diagnosis not present

## 2021-06-01 DIAGNOSIS — I10 Essential (primary) hypertension: Secondary | ICD-10-CM | POA: Diagnosis not present

## 2021-06-01 DIAGNOSIS — R5381 Other malaise: Secondary | ICD-10-CM

## 2021-06-01 NOTE — Telephone Encounter (Signed)
See below

## 2021-06-02 DIAGNOSIS — Z6827 Body mass index (BMI) 27.0-27.9, adult: Secondary | ICD-10-CM | POA: Diagnosis not present

## 2021-06-02 DIAGNOSIS — Z9181 History of falling: Secondary | ICD-10-CM | POA: Diagnosis not present

## 2021-06-02 DIAGNOSIS — Z436 Encounter for attention to other artificial openings of urinary tract: Secondary | ICD-10-CM | POA: Diagnosis not present

## 2021-06-02 DIAGNOSIS — Z483 Aftercare following surgery for neoplasm: Secondary | ICD-10-CM | POA: Diagnosis not present

## 2021-06-02 DIAGNOSIS — Z906 Acquired absence of other parts of urinary tract: Secondary | ICD-10-CM | POA: Diagnosis not present

## 2021-06-02 DIAGNOSIS — C678 Malignant neoplasm of overlapping sites of bladder: Secondary | ICD-10-CM | POA: Diagnosis not present

## 2021-06-02 DIAGNOSIS — I25119 Atherosclerotic heart disease of native coronary artery with unspecified angina pectoris: Secondary | ICD-10-CM | POA: Diagnosis not present

## 2021-06-02 DIAGNOSIS — E669 Obesity, unspecified: Secondary | ICD-10-CM | POA: Diagnosis not present

## 2021-06-02 DIAGNOSIS — I10 Essential (primary) hypertension: Secondary | ICD-10-CM | POA: Diagnosis not present

## 2021-06-05 ENCOUNTER — Other Ambulatory Visit: Payer: Self-pay

## 2021-06-05 DIAGNOSIS — R110 Nausea: Secondary | ICD-10-CM

## 2021-06-05 DIAGNOSIS — C678 Malignant neoplasm of overlapping sites of bladder: Secondary | ICD-10-CM

## 2021-06-05 NOTE — Telephone Encounter (Signed)
Do you know what blood work is needed?

## 2021-06-06 DIAGNOSIS — C678 Malignant neoplasm of overlapping sites of bladder: Secondary | ICD-10-CM | POA: Diagnosis not present

## 2021-06-06 DIAGNOSIS — Z436 Encounter for attention to other artificial openings of urinary tract: Secondary | ICD-10-CM | POA: Diagnosis not present

## 2021-06-06 DIAGNOSIS — I10 Essential (primary) hypertension: Secondary | ICD-10-CM | POA: Diagnosis not present

## 2021-06-06 DIAGNOSIS — Z483 Aftercare following surgery for neoplasm: Secondary | ICD-10-CM | POA: Diagnosis not present

## 2021-06-06 DIAGNOSIS — Z6827 Body mass index (BMI) 27.0-27.9, adult: Secondary | ICD-10-CM | POA: Diagnosis not present

## 2021-06-06 DIAGNOSIS — Z906 Acquired absence of other parts of urinary tract: Secondary | ICD-10-CM | POA: Diagnosis not present

## 2021-06-06 DIAGNOSIS — E669 Obesity, unspecified: Secondary | ICD-10-CM | POA: Diagnosis not present

## 2021-06-06 DIAGNOSIS — Z9181 History of falling: Secondary | ICD-10-CM | POA: Diagnosis not present

## 2021-06-06 DIAGNOSIS — I25119 Atherosclerotic heart disease of native coronary artery with unspecified angina pectoris: Secondary | ICD-10-CM | POA: Diagnosis not present

## 2021-06-07 ENCOUNTER — Emergency Department (HOSPITAL_COMMUNITY): Payer: Medicare HMO

## 2021-06-07 ENCOUNTER — Inpatient Hospital Stay (HOSPITAL_COMMUNITY)
Admission: EM | Admit: 2021-06-07 | Discharge: 2021-06-19 | DRG: 871 | Disposition: A | Discharge status: Home Health | Payer: Medicare HMO | Attending: Internal Medicine | Admitting: Internal Medicine

## 2021-06-07 ENCOUNTER — Other Ambulatory Visit: Payer: Self-pay

## 2021-06-07 ENCOUNTER — Encounter (HOSPITAL_COMMUNITY): Payer: Self-pay | Admitting: Emergency Medicine

## 2021-06-07 DIAGNOSIS — R000 Tachycardia, unspecified: Secondary | ICD-10-CM | POA: Diagnosis not present

## 2021-06-07 DIAGNOSIS — Z8249 Family history of ischemic heart disease and other diseases of the circulatory system: Secondary | ICD-10-CM

## 2021-06-07 DIAGNOSIS — Z978 Presence of other specified devices: Secondary | ICD-10-CM

## 2021-06-07 DIAGNOSIS — J9601 Acute respiratory failure with hypoxia: Secondary | ICD-10-CM | POA: Diagnosis not present

## 2021-06-07 DIAGNOSIS — R627 Adult failure to thrive: Secondary | ICD-10-CM | POA: Diagnosis present

## 2021-06-07 DIAGNOSIS — F952 Tourette's disorder: Secondary | ICD-10-CM | POA: Diagnosis present

## 2021-06-07 DIAGNOSIS — E119 Type 2 diabetes mellitus without complications: Secondary | ICD-10-CM | POA: Diagnosis not present

## 2021-06-07 DIAGNOSIS — A419 Sepsis, unspecified organism: Secondary | ICD-10-CM | POA: Diagnosis not present

## 2021-06-07 DIAGNOSIS — A4181 Sepsis due to Enterococcus: Secondary | ICD-10-CM | POA: Diagnosis not present

## 2021-06-07 DIAGNOSIS — R579 Shock, unspecified: Secondary | ICD-10-CM | POA: Diagnosis present

## 2021-06-07 DIAGNOSIS — N390 Urinary tract infection, site not specified: Secondary | ICD-10-CM

## 2021-06-07 DIAGNOSIS — G9341 Metabolic encephalopathy: Secondary | ICD-10-CM | POA: Diagnosis not present

## 2021-06-07 DIAGNOSIS — Z4682 Encounter for fitting and adjustment of non-vascular catheter: Secondary | ICD-10-CM | POA: Diagnosis not present

## 2021-06-07 DIAGNOSIS — K922 Gastrointestinal hemorrhage, unspecified: Secondary | ICD-10-CM | POA: Diagnosis not present

## 2021-06-07 DIAGNOSIS — R6521 Severe sepsis with septic shock: Secondary | ICD-10-CM | POA: Diagnosis not present

## 2021-06-07 DIAGNOSIS — G9340 Encephalopathy, unspecified: Secondary | ICD-10-CM | POA: Diagnosis not present

## 2021-06-07 DIAGNOSIS — R531 Weakness: Secondary | ICD-10-CM | POA: Diagnosis not present

## 2021-06-07 DIAGNOSIS — C679 Malignant neoplasm of bladder, unspecified: Secondary | ICD-10-CM | POA: Diagnosis present

## 2021-06-07 DIAGNOSIS — E785 Hyperlipidemia, unspecified: Secondary | ICD-10-CM | POA: Diagnosis not present

## 2021-06-07 DIAGNOSIS — G4733 Obstructive sleep apnea (adult) (pediatric): Secondary | ICD-10-CM | POA: Diagnosis present

## 2021-06-07 DIAGNOSIS — Z7982 Long term (current) use of aspirin: Secondary | ICD-10-CM

## 2021-06-07 DIAGNOSIS — R6510 Systemic inflammatory response syndrome (SIRS) of non-infectious origin without acute organ dysfunction: Secondary | ICD-10-CM | POA: Diagnosis present

## 2021-06-07 DIAGNOSIS — E1142 Type 2 diabetes mellitus with diabetic polyneuropathy: Secondary | ICD-10-CM | POA: Diagnosis present

## 2021-06-07 DIAGNOSIS — Z20822 Contact with and (suspected) exposure to covid-19: Secondary | ICD-10-CM | POA: Diagnosis not present

## 2021-06-07 DIAGNOSIS — R2689 Other abnormalities of gait and mobility: Secondary | ICD-10-CM | POA: Diagnosis not present

## 2021-06-07 DIAGNOSIS — K264 Chronic or unspecified duodenal ulcer with hemorrhage: Secondary | ICD-10-CM | POA: Diagnosis not present

## 2021-06-07 DIAGNOSIS — E861 Hypovolemia: Secondary | ICD-10-CM | POA: Diagnosis not present

## 2021-06-07 DIAGNOSIS — E860 Dehydration: Secondary | ICD-10-CM | POA: Diagnosis not present

## 2021-06-07 DIAGNOSIS — R69 Illness, unspecified: Secondary | ICD-10-CM | POA: Diagnosis not present

## 2021-06-07 DIAGNOSIS — K260 Acute duodenal ulcer with hemorrhage: Secondary | ICD-10-CM | POA: Diagnosis not present

## 2021-06-07 DIAGNOSIS — D62 Acute posthemorrhagic anemia: Secondary | ICD-10-CM | POA: Diagnosis present

## 2021-06-07 DIAGNOSIS — R578 Other shock: Secondary | ICD-10-CM | POA: Diagnosis present

## 2021-06-07 DIAGNOSIS — K921 Melena: Secondary | ICD-10-CM | POA: Diagnosis not present

## 2021-06-07 DIAGNOSIS — E876 Hypokalemia: Secondary | ICD-10-CM | POA: Diagnosis not present

## 2021-06-07 DIAGNOSIS — I714 Abdominal aortic aneurysm, without rupture: Secondary | ICD-10-CM | POA: Diagnosis present

## 2021-06-07 DIAGNOSIS — Z79899 Other long term (current) drug therapy: Secondary | ICD-10-CM | POA: Diagnosis not present

## 2021-06-07 DIAGNOSIS — B952 Enterococcus as the cause of diseases classified elsewhere: Secondary | ICD-10-CM | POA: Diagnosis not present

## 2021-06-07 DIAGNOSIS — K269 Duodenal ulcer, unspecified as acute or chronic, without hemorrhage or perforation: Secondary | ICD-10-CM | POA: Diagnosis present

## 2021-06-07 DIAGNOSIS — E440 Moderate protein-calorie malnutrition: Secondary | ICD-10-CM | POA: Diagnosis not present

## 2021-06-07 DIAGNOSIS — I10 Essential (primary) hypertension: Secondary | ICD-10-CM | POA: Diagnosis present

## 2021-06-07 DIAGNOSIS — Z6824 Body mass index (BMI) 24.0-24.9, adult: Secondary | ICD-10-CM

## 2021-06-07 DIAGNOSIS — E782 Mixed hyperlipidemia: Secondary | ICD-10-CM | POA: Diagnosis present

## 2021-06-07 DIAGNOSIS — Z800 Family history of malignant neoplasm of digestive organs: Secondary | ICD-10-CM

## 2021-06-07 DIAGNOSIS — I2510 Atherosclerotic heart disease of native coronary artery without angina pectoris: Secondary | ICD-10-CM | POA: Diagnosis present

## 2021-06-07 DIAGNOSIS — Z87891 Personal history of nicotine dependence: Secondary | ICD-10-CM | POA: Diagnosis not present

## 2021-06-07 DIAGNOSIS — K449 Diaphragmatic hernia without obstruction or gangrene: Secondary | ICD-10-CM | POA: Diagnosis present

## 2021-06-07 DIAGNOSIS — R7989 Other specified abnormal findings of blood chemistry: Secondary | ICD-10-CM

## 2021-06-07 DIAGNOSIS — Z4659 Encounter for fitting and adjustment of other gastrointestinal appliance and device: Secondary | ICD-10-CM | POA: Diagnosis not present

## 2021-06-07 DIAGNOSIS — D509 Iron deficiency anemia, unspecified: Secondary | ICD-10-CM | POA: Diagnosis not present

## 2021-06-07 DIAGNOSIS — R5383 Other fatigue: Secondary | ICD-10-CM | POA: Diagnosis not present

## 2021-06-07 DIAGNOSIS — N1330 Unspecified hydronephrosis: Secondary | ICD-10-CM | POA: Diagnosis not present

## 2021-06-07 DIAGNOSIS — N281 Cyst of kidney, acquired: Secondary | ICD-10-CM | POA: Diagnosis not present

## 2021-06-07 DIAGNOSIS — I4510 Unspecified right bundle-branch block: Secondary | ICD-10-CM | POA: Diagnosis present

## 2021-06-07 DIAGNOSIS — N136 Pyonephrosis: Secondary | ICD-10-CM | POA: Diagnosis present

## 2021-06-07 DIAGNOSIS — R059 Cough, unspecified: Secondary | ICD-10-CM | POA: Diagnosis not present

## 2021-06-07 DIAGNOSIS — R5381 Other malaise: Secondary | ICD-10-CM | POA: Diagnosis not present

## 2021-06-07 DIAGNOSIS — K259 Gastric ulcer, unspecified as acute or chronic, without hemorrhage or perforation: Secondary | ICD-10-CM | POA: Diagnosis not present

## 2021-06-07 DIAGNOSIS — I469 Cardiac arrest, cause unspecified: Secondary | ICD-10-CM | POA: Diagnosis not present

## 2021-06-07 DIAGNOSIS — R079 Chest pain, unspecified: Secondary | ICD-10-CM | POA: Diagnosis not present

## 2021-06-07 DIAGNOSIS — J80 Acute respiratory distress syndrome: Secondary | ICD-10-CM | POA: Diagnosis not present

## 2021-06-07 DIAGNOSIS — R4182 Altered mental status, unspecified: Secondary | ICD-10-CM | POA: Diagnosis not present

## 2021-06-07 LAB — CBC WITH DIFFERENTIAL/PLATELET
Abs Immature Granulocytes: 0.52 10*3/uL — ABNORMAL HIGH (ref 0.00–0.07)
Basophils Absolute: 0.1 10*3/uL (ref 0.0–0.1)
Basophils Relative: 1 %
Eosinophils Absolute: 0.1 10*3/uL (ref 0.0–0.5)
Eosinophils Relative: 1 %
HCT: 37.2 % — ABNORMAL LOW (ref 39.0–52.0)
Hemoglobin: 11.6 g/dL — ABNORMAL LOW (ref 13.0–17.0)
Immature Granulocytes: 4 %
Lymphocytes Relative: 9 %
Lymphs Abs: 1.4 10*3/uL (ref 0.7–4.0)
MCH: 28.3 pg (ref 26.0–34.0)
MCHC: 31.2 g/dL (ref 30.0–36.0)
MCV: 90.7 fL (ref 80.0–100.0)
Monocytes Absolute: 1 10*3/uL (ref 0.1–1.0)
Monocytes Relative: 7 %
Neutro Abs: 11.9 10*3/uL — ABNORMAL HIGH (ref 1.7–7.7)
Neutrophils Relative %: 78 %
Platelets: 331 10*3/uL (ref 150–400)
RBC: 4.1 MIL/uL — ABNORMAL LOW (ref 4.22–5.81)
RDW: 14.7 % (ref 11.5–15.5)
WBC: 15 10*3/uL — ABNORMAL HIGH (ref 4.0–10.5)
nRBC: 0 % (ref 0.0–0.2)

## 2021-06-07 LAB — URINALYSIS, ROUTINE W REFLEX MICROSCOPIC
Bilirubin Urine: NEGATIVE
Glucose, UA: NEGATIVE mg/dL
Ketones, ur: NEGATIVE mg/dL
Nitrite: NEGATIVE
Protein, ur: 100 mg/dL — AB
RBC / HPF: >50 RBC/hpf — ABNORMAL HIGH (ref 0–5)
Specific Gravity, Urine: 1.013 (ref 1.005–1.030)
WBC, UA: >50 WBC/hpf — ABNORMAL HIGH (ref 0–5)
pH: 9 — ABNORMAL HIGH (ref 5.0–8.0)

## 2021-06-07 LAB — APTT: aPTT: 30 seconds (ref 24–36)

## 2021-06-07 LAB — COMPREHENSIVE METABOLIC PANEL
ALT: 44 U/L (ref 0–44)
AST: 30 U/L (ref 15–41)
Albumin: 2.7 g/dL — ABNORMAL LOW (ref 3.5–5.0)
Alkaline Phosphatase: 99 U/L (ref 38–126)
Anion gap: 11 (ref 5–15)
BUN: 36 mg/dL — ABNORMAL HIGH (ref 8–23)
CO2: 23 mmol/L (ref 22–32)
Calcium: 9.6 mg/dL (ref 8.9–10.3)
Chloride: 103 mmol/L (ref 98–111)
Creatinine, Ser: 1.11 mg/dL (ref 0.61–1.24)
GFR, Estimated: >60 mL/min (ref >60)
Glucose, Bld: 184 mg/dL — ABNORMAL HIGH (ref 70–99)
Potassium: 4.5 mmol/L (ref 3.5–5.1)
Sodium: 137 mmol/L (ref 135–145)
Total Bilirubin: 0.7 mg/dL (ref 0.3–1.2)
Total Protein: 6.8 g/dL (ref 6.5–8.1)

## 2021-06-07 LAB — LACTIC ACID, PLASMA
Lactic Acid, Venous: 2.5 mmol/L (ref 0.5–1.9)
Lactic Acid, Venous: 1.4 mmol/L (ref 0.5–1.9)

## 2021-06-07 LAB — PROTIME-INR
INR: 1.1 (ref 0.8–1.2)
Prothrombin Time: 14.2 seconds (ref 11.4–15.2)

## 2021-06-07 LAB — RESP PANEL BY RT-PCR (FLU A&B, COVID) ARPGX2
Influenza A by PCR: NEGATIVE
Influenza B by PCR: NEGATIVE
SARS Coronavirus 2 by RT PCR: NEGATIVE

## 2021-06-07 LAB — AMMONIA: Ammonia: 14 umol/L (ref 9–35)

## 2021-06-07 LAB — TSH: TSH: 2.768 u[IU]/mL (ref 0.350–4.500)

## 2021-06-07 LAB — MAGNESIUM: Magnesium: 1.7 mg/dL (ref 1.7–2.4)

## 2021-06-07 MED ORDER — ACETAMINOPHEN 650 MG RE SUPP: 650.0000 mg | Freq: Four times a day (QID) | RECTAL | Status: DC | PRN

## 2021-06-07 MED ORDER — IOHEXOL 350 MG/ML SOLN
80.0000 mL | Freq: Once | INTRAVENOUS | Status: AC | PRN
  Administered 2021-06-07: 80 mL via INTRAVENOUS

## 2021-06-07 MED ORDER — ENOXAPARIN SODIUM 40 MG/0.4ML IJ SOSY
40.0000 mg | PREFILLED_SYRINGE | INTRAMUSCULAR | Status: DC
  Administered 2021-06-08: 40 mg via SUBCUTANEOUS
  Filled 2021-06-07 (×2): qty 0.4

## 2021-06-07 MED ORDER — ACETAMINOPHEN 325 MG PO TABS
650.0000 mg | ORAL_TABLET | Freq: Four times a day (QID) | ORAL | Status: DC | PRN
Start: 2021-06-07 — End: 2021-06-11
  Administered 2021-06-09: 650 mg via ORAL
  Filled 2021-06-07: qty 2

## 2021-06-07 MED ORDER — INSULIN ASPART 100 UNIT/ML IJ SOLN
0.0000 [IU] | Freq: Three times a day (TID) | INTRAMUSCULAR | Status: DC
  Administered 2021-06-08 (×2): 2 [IU] via SUBCUTANEOUS
  Administered 2021-06-08: 3 [IU] via SUBCUTANEOUS
  Administered 2021-06-09 (×3): 2 [IU] via SUBCUTANEOUS
  Filled 2021-06-07: qty 0.15

## 2021-06-07 MED ORDER — DEXTROSE IN LACTATED RINGERS 5 % IV SOLN: INTRAVENOUS | Status: DC

## 2021-06-07 MED ORDER — LACTATED RINGERS IV BOLUS
1000.0000 mL | Freq: Once | INTRAVENOUS | Status: AC
  Administered 2021-06-07: 1000 mL via INTRAVENOUS

## 2021-06-07 MED ORDER — ONDANSETRON HCL 4 MG PO TABS: 4.0000 mg | ORAL_TABLET | Freq: Four times a day (QID) | ORAL | Status: DC | PRN

## 2021-06-07 MED ORDER — SODIUM CHLORIDE 0.9 % IV SOLN
1.0000 g | Freq: Once | INTRAVENOUS | Status: DC
  Filled 2021-06-07: qty 10

## 2021-06-07 MED ORDER — CEFTRIAXONE SODIUM 1 G IJ SOLR
1.0000 g | INTRAMUSCULAR | Status: DC
  Administered 2021-06-08 – 2021-06-09 (×3): 1 g via INTRAVENOUS
  Filled 2021-06-07 (×2): qty 1

## 2021-06-07 MED ORDER — ONDANSETRON HCL 4 MG/2ML IJ SOLN
4.0000 mg | Freq: Four times a day (QID) | INTRAMUSCULAR | Status: DC | PRN
  Administered 2021-06-09: 4 mg via INTRAVENOUS
  Filled 2021-06-07: qty 2

## 2021-06-07 MED ORDER — INSULIN ASPART 100 UNIT/ML IJ SOLN
0.0000 [IU] | Freq: Every day | INTRAMUSCULAR | Status: DC
  Filled 2021-06-07: qty 0.05

## 2021-06-07 MED ORDER — MORPHINE SULFATE (PF) 2 MG/ML IV SOLN
2.0000 mg | INTRAVENOUS | Status: DC | PRN
Start: 2021-06-07 — End: 2021-06-08

## 2021-06-07 NOTE — H&P (Signed)
History and Physical   [name]. [id number] DOB: 12-09-1940 DOA: 06/07/2021  Referring MD/NP/PA: Dr. Armandina Gemma  PCP: Celene Squibb, MD   Outpatient Specialists: Dr. Tresa Moore, urology  Patient coming from: Home  Chief Complaint: Weakness and lethargy  HPI: [name]. is a 80 y.o. male with medical history significant of bladder cancer s/p cystectomy, prostatectomy and ileal conduit surgery on July 22, coronary artery disease, hyperlipidemia, essential hypertension, obstructive sleep apnea, Tourette's syndrome among other things who was brought in today secondary to progressive weakness failure to thrive at home and generalized malaise since his surgery.  Per family he has been declining with poor oral intake.  Patient was noted to be dehydrated.  Received fluid resuscitation in the ER.  Still lethargic and weak.  Urinalysis not impressive for a UTI but he meets SIRS criteria.  ER physician has discussed with urology who recommends treating patient as if he has a UTI postoperatively.  Patient therefore being admitted to the hospital for further evaluation and treatment.  ED Course: Temperature is 98.2 blood pressure 97/85, pulse 114, respiratory of 21 oxygen sat 93% on room air.  White count 15.1 hemoglobin 11.1 platelets 331.  Chemistry showed BUN 36 otherwise mostly within normal.  Lactic acid is 2.5.  Head CT without contrast showed no acute findings CT abdomen pelvis showed interval prostatectomy and cystectomy with right lower quadrant urostomy.  There is mild right and moderate left hydronephrosis with stents in place.  Also infrarenal abdominal aortic aneurysm of 3.7 cm.  Patient being admitted for treatment by urology  Review of Systems: As per HPI otherwise 10 point review of systems negative.    Past Medical History:  Diagnosis Date   Anginal pain ([place]) 1997   Coronary artery disease    First degree AV block 01/16/2021   Noted on EKG   History of kidney stones     Hypercholesteremia    Hypertension    Neuromuscular disorder ([place])    peripheral neuropathy   OSA (obstructive sleep apnea) 08/05/2013   Pre-diabetes    Right bundle branch block (RBBB) 01/16/2021   Noted on EKG   Sleep apnea    Tourette syndrome    Tourette's syndrome 04/01/2013    Past Surgical History:  Procedure Laterality Date   [place]   CATARACT EXTRACTION  11/2012   CYSTOSCOPY WITH INJECTION N/A 05/19/2021   Procedure: CYSTOSCOPY WITH INJECTION OF INDOCYANINE GREEN DYE;  Surgeon: Alexis Frock, MD;  Location: [facility];  Service: Urology;  Laterality: N/A;   heart stent  01/16/1996   LYMPH NODE DISSECTION Bilateral 05/19/2021   Procedure: LYMPH NODE DISSECTION;  Surgeon: Alexis Frock, MD;  Location: [facility];  Service: Urology;  Laterality: Bilateral;   ROBOT ASSISTED LAPAROSCOPIC COMPLETE CYSTECT ILEAL CONDUIT N/A 05/19/2021   Procedure: XI ROBOTIC ASSISTED LAPAROSCOPIC COMPLETE CYSTECT ILEAL CONDUIT;  Surgeon: Alexis Frock, MD;  Location: [facility];  Service: Urology;  Laterality: N/A;   ROBOT ASSISTED LAPAROSCOPIC RADICAL PROSTATECTOMY N/A 05/19/2021   Procedure: XI ROBOTIC ASSISTED LAPAROSCOPIC RADICAL PROSTATECTOMY;  Surgeon: Alexis Frock, MD;  Location: [facility];  Service: Urology;  Laterality: N/A;   TRANSURETHRAL RESECTION OF BLADDER TUMOR Bilateral 01/17/2021   Procedure: TRANSURETHRAL RESECTION OF BLADDER TUMOR (TURBT);  Surgeon: Irine Seal, MD;  Location: [facility];  Service: Urology;  Laterality: Bilateral;  GENERAL ANESTHESIA WITH  PARLYSIS   TRANSURETHRAL RESECTION OF BLADDER TUMOR Left 02/03/2021   Procedure: TRANSURETHRAL  RESECTION OF BLADDER TUMOR (TURBT);  Surgeon: Irine Seal, MD;  Location: [facility];  Service: Urology;  Laterality: Left;  WITH PARALYPIC     reports that he quit smoking about 25 years ago. His smoking use included cigarettes. He has never used smokeless tobacco. He reports that he does not drink alcohol and does  not use drugs.  No Known Allergies  Family History  Problem Relation Age of Onset   Pancreatic cancer Mother    Heart disease Father      Prior to Admission medications   Medication Sig Start Date End Date Taking? Authorizing Provider  aspirin 81 MG EC tablet Take 81 mg by mouth daily. Swallow whole.    [provider]  cetirizine (ZYRTEC) 10 MG tablet Take 10 mg by mouth at bedtime.    [provider]  Coenzyme Q10 (COQ-10) 100 MG CAPS Take 100 mg by mouth 2 (two) times daily.    [provider]  guanFACINE (TENEX) 1 MG tablet Take 2 tablets (2 mg total) by mouth at bedtime. 01/30/21   Star Age, MD  lisinopril (ZESTRIL) 2.5 MG tablet Take 2.5 mg by mouth in the morning.    [provider]  metoprolol succinate (TOPROL-XL) 25 MG 24 hr tablet Take 25 mg by mouth in the morning and at bedtime. 02/02/13   [provider]  simvastatin (ZOCOR) 20 MG tablet Take 20 mg by mouth at bedtime.    [provider]  traMADol (ULTRAM) 50 MG tablet Take 1 - 2 tablets by mouth every 6 hours as needed for moderate to severe pain (post-operatively). 05/25/21 05/25/22  Alexis Frock, MD    Physical Exam: Vitals:   06/07/21 2130 06/07/21 2200 06/07/21 2230 06/07/21 2300  BP: 112/73 107/66 113/64 124/63  Pulse: 70 73 82 75  Resp: '13 12 18 10  '$ Temp:      TempSrc:      SpO2: 98% 99% 97% 98%  Weight:      Height:          Constitutional: Chronically ill looking, in mild distress and withdrawn Vitals:   06/07/21 2130 06/07/21 2200 06/07/21 2230 06/07/21 2300  BP: 112/73 107/66 113/64 124/63  Pulse: 70 73 82 75  Resp: '13 12 18 10  '$ Temp:      TempSrc:      SpO2: 98% 99% 97% 98%  Weight:      Height:       Eyes: PERRL, lids and conjunctivae normal ENMT: Mucous membranes are dry. Posterior pharynx clear of any exudate or lesions.Normal dentition.  Neck: normal, supple, no masses, no thyromegaly Respiratory: clear to auscultation  bilaterally, no wheezing, no crackles. Normal respiratory effort. No accessory muscle use.  Cardiovascular: Regular rate and rhythm, no murmurs / rubs / gallops. No extremity edema. 2+ pedal pulses. No carotid bruits.  Abdomen: Urostomy bag in place, mild tenderness, no masses palpated. No hepatosplenomegaly. Bowel sounds positive.  Musculoskeletal: no clubbing / cyanosis. No joint deformity upper and lower extremities. Good ROM, no contractures. Normal muscle tone.  Skin: no rashes, lesions, ulcers. No induration Neurologic: CN 2-12 grossly intact. Sensation intact, DTR normal. Strength 5/5 in all 4.  Psychiatric: Mild lethargy, oriented to self    Labs on Admission: I have personally reviewed following labs and imaging studies  CBC: Recent Labs  Lab 06/07/21 1752  WBC 15.0*  NEUTROABS 11.9*  HGB 11.6*  HCT 37.2*  MCV 90.7  PLT [id number]   Basic Metabolic Panel: Recent  Labs  Lab 06/07/21 1752 06/07/21 1821  NA 137  --   K 4.5  --   CL 103  --   CO2 23  --   GLUCOSE 184*  --   BUN 36*  --   CREATININE 1.11  --   CALCIUM 9.6  --   MG  --  1.7   GFR: Estimated Creatinine Clearance: 57.3 mL/min (by C-G formula based on SCr of 1.11 mg/dL). Liver Function Tests: Recent Labs  Lab 06/07/21 1752  AST 30  ALT 44  ALKPHOS 99  BILITOT 0.7  PROT 6.8  ALBUMIN 2.7*   No results for input(s): LIPASE, AMYLASE in the last 168 hours. Recent Labs  Lab 06/07/21 1822  AMMONIA 14   Coagulation Profile: Recent Labs  Lab 06/07/21 1752  INR 1.1   Cardiac Enzymes: No results for input(s): CKTOTAL, CKMB, CKMBINDEX, TROPONINI in the last 168 hours. BNP (last 3 results) No results for input(s): PROBNP in the last 8760 hours. HbA1C: No results for input(s): HGBA1C in the last 72 hours. CBG: No results for input(s): GLUCAP in the last 168 hours. Lipid Profile: No results for input(s): CHOL, HDL, LDLCALC, TRIG, CHOLHDL, LDLDIRECT in the last 72 hours. Thyroid Function  Tests: Recent Labs    06/07/21 1822  TSH 2.768   Anemia Panel: No results for input(s): VITAMINB12, FOLATE, FERRITIN, TIBC, IRON, RETICCTPCT in the last 72 hours. Urine analysis:    Component Value Date/Time   COLORURINE YELLOW 06/07/2021 1734   APPEARANCEUR TURBID (A) 06/07/2021 1734   APPEARANCEUR Hazy (A) 01/05/2021 1335   LABSPEC 1.013 06/07/2021 1734   PHURINE 9.0 (H) 06/07/2021 1734   GLUCOSEU NEGATIVE 06/07/2021 1734   HGBUR SMALL (A) 06/07/2021 1734   BILIRUBINUR NEGATIVE 06/07/2021 1734   BILIRUBINUR Negative 01/05/2021 1335   KETONESUR NEGATIVE 06/07/2021 1734   PROTEINUR 100 (A) 06/07/2021 1734   NITRITE NEGATIVE 06/07/2021 1734   LEUKOCYTESUR MODERATE (A) 06/07/2021 1734   Sepsis Labs: '@LABRCNTIP'$ (procalcitonin:4,lacticidven:4) ) Recent Results (from the past 240 hour(s))  Blood Culture (routine x 2)     Status: None (Preliminary result)   Collection Time: 06/07/21  5:40 PM   Specimen: BLOOD RIGHT FOREARM  Result Value Ref Range Status   Specimen Description BLOOD RIGHT FOREARM  Final   Special Requests   Final    BOTTLES DRAWN AEROBIC AND ANAEROBIC Blood Culture results may not be optimal due to an excessive volume of blood received in culture bottles Performed at [facility], [place] [address], [place], [place] [address]    Culture PENDING  Incomplete   Report Status PENDING  Incomplete  Blood Culture (routine x 2)     Status: None (Preliminary result)   Collection Time: 06/07/21  5:55 PM   Specimen: BLOOD  Result Value Ref Range Status   Specimen Description   Final    BLOOD LEFT ANTECUBITAL Performed at [place] [address] [place], [place] [address]    Special Requests   Final    BOTTLES DRAWN AEROBIC AND ANAEROBIC Blood Culture adequate volume Performed at [facility], [place] [address], [place], [place] [address]    Culture PENDING  Incomplete   Report Status PENDING  Incomplete  Resp Panel by RT-PCR (Flu A&B,  Covid) Nasopharyngeal Swab     Status: None   Collection Time: 06/07/21  6:30 PM   Specimen: Nasopharyngeal Swab; Nasopharyngeal(NP) swabs in vial transport medium  Result Value Ref Range Status  SARS Coronavirus 2 by RT PCR NEGATIVE NEGATIVE Final    Comment: (NOTE) SARS-CoV-2 target nucleic acids are NOT DETECTED.  The SARS-CoV-2 RNA is generally detectable in upper respiratory specimens during the acute phase of infection. The lowest concentration of SARS-CoV-2 viral copies this assay can detect is 138 copies/mL. A negative result does not preclude SARS-Cov-2 infection and should not be used as the sole basis for treatment or other patient management decisions. A negative result may occur with  improper specimen collection/handling, submission of specimen other than nasopharyngeal swab, presence of viral mutation(s) within the areas targeted by this assay, and inadequate number of viral copies(<138 copies/mL). A negative result must be combined with clinical observations, patient history, and epidemiological information. The expected result is Negative.  Fact Sheet for Patients:  EntrepreneurPulse.com.au  Fact Sheet for Healthcare Providers:  IncredibleEmployment.be  This test is no t yet approved or cleared by the [place] FDA and  has been authorized for detection and/or diagnosis of SARS-CoV-2 by FDA under an Emergency Use Authorization (EUA). This EUA will remain  in effect (meaning this test can be used) for the duration of the COVID-19 declaration under Section 564(b)(1) of the Act, 21 U.S.C.section 360bbb-3(b)(1), unless the authorization is terminated  or revoked sooner.       Influenza A by PCR NEGATIVE NEGATIVE Final   Influenza B by PCR NEGATIVE NEGATIVE Final    Comment: (NOTE) The Xpert Xpress SARS-CoV-2/FLU/RSV plus assay is intended as an aid in the diagnosis of influenza from Nasopharyngeal swab specimens and should  not be used as a sole basis for treatment. Nasal washings and aspirates are unacceptable for Xpert Xpress SARS-CoV-2/FLU/RSV testing.  Fact Sheet for Patients: EntrepreneurPulse.com.au  Fact Sheet for Healthcare Providers: IncredibleEmployment.be  This test is not yet approved or cleared by the [place] FDA and has been authorized for detection and/or diagnosis of SARS-CoV-2 by FDA under an Emergency Use Authorization (EUA). This EUA will remain in effect (meaning this test can be used) for the duration of the COVID-19 declaration under Section 564(b)(1) of the Act, 21 U.S.C. section 360bbb-3(b)(1), unless the authorization is terminated or revoked.  Performed at [facility], [place] [address] [place], [place] [address]      Radiological Exams on Admission: CT HEAD WO CONTRAST (5MM)  Result Date: 06/07/2021 CLINICAL DATA:  Mental status change, unknown cause EXAM: CT HEAD WITHOUT CONTRAST TECHNIQUE: Contiguous axial images were obtained from the base of the skull through the vertex without intravenous contrast. COMPARISON:  None. FINDINGS: Brain: No acute intracranial abnormality. Specifically, no hemorrhage, hydrocephalus, mass lesion, acute infarction, or significant intracranial injury. Mild age related volume loss. Vascular: No hyperdense vessel or unexpected calcification. Skull: No acute calvarial abnormality. Sinuses/Orbits: No acute findings Other: None IMPRESSION: No acute intracranial abnormality. Electronically Signed   By: Rolm Baptise M.D.   On: 06/07/2021 19:33   CT ABDOMEN PELVIS W CONTRAST  Result Date: 06/07/2021 CLINICAL DATA:  Recent bladder surgery weakness EXAM: CT ABDOMEN AND PELVIS WITH CONTRAST TECHNIQUE: Multidetector CT imaging of the abdomen and pelvis was performed using the standard protocol following bolus administration of intravenous contrast. CONTRAST:  50m OMNIPAQUE IOHEXOL 350 MG/ML SOLN  COMPARISON:  CT 02/17/2021, 01/03/2021 FINDINGS: Lower chest: Lung bases demonstrate no acute consolidation or effusion. Normal cardiac size. Hepatobiliary: No focal liver abnormality is seen. No gallstones, gallbladder wall thickening, or biliary dilatation. Pancreas: Unremarkable. No pancreatic ductal dilatation or surrounding inflammatory changes. Spleen: Normal in size without focal abnormality.  Adrenals/Urinary Tract: Adrenal glands are normal. Lower pole right renal sinus cyst. Multiple bilateral kidney stones. Interval cystectomy and right lower quadrant urostomy. Placement of bilateral ureteral stents. Interim development of mild right and moderate left hydronephrosis. Stomach/Bowel: The stomach is nonenlarged. No dilated small bowel. No acute bowel wall thickening. Vascular/Lymphatic: Advanced aortic atherosclerosis. 3.7 cm infrarenal abdominal aortic aneurysm. No suspicious nodes Reproductive: Status post prostatectomy Other: Small pelvic effusion. No free air. Moderate gas in the subcutaneous soft tissues Musculoskeletal: No acute osseous abnormality. Degenerative changes of the spine. IMPRESSION: 1. Interval prostatectomy and cystectomy and right lower quadrant urostomy. Interim development of mild right and moderate left hydronephrosis with ureteral stents in place. Correlate for stent function. 2. Small fluid in the pelvis. Moderate gas in the subcutaneous soft tissues of the abdominal wall presumably due to postsurgical gas. 3. Infrarenal abdominal aortic aneurysm up to 3.7 cm. Recommend follow-up ultrasound every 2 years. This recommendation follows ACR consensus guidelines: White Paper of the ACR Incidental Findings Committee II on Vascular Findings. J Am Coll Radiol 2013; 10:789-794. Electronically Signed   By: Donavan Foil M.D.   On: 06/07/2021 19:48   DG Chest Port 1 View  Result Date: 06/07/2021 CLINICAL DATA:  Chest pain, cough. EXAM: PORTABLE CHEST 1 VIEW COMPARISON:  None. FINDINGS: The  heart size and mediastinal contours are within normal limits. Both lungs are clear. The visualized skeletal structures are unremarkable. IMPRESSION: No active disease. Aortic Atherosclerosis (ICD10-I70.0). Electronically Signed   By: Marijo Conception M.D.   On: 06/07/2021 18:36      Assessment/Plan Principal Problem:   SIRS (systemic inflammatory response syndrome) (HCC) Active Problems:   OSA (obstructive sleep apnea)   Bladder cancer (HCC)   Mixed hyperlipidemia   Essential hypertension   Type 2 diabetes mellitus ([place])     #1 SIRS: Possibly postsurgical infection.  Patient will empirically be on Rocephin.  Hydrate aggressively.  Still some blood and urine cultures.  #2 generalized weakness and lethargy: Again appears to be postoperative.  May be related directly to surgery.  PT and OT consult and treat with hydration and supportive care.  #3 diabetes: Non-insulin-dependent.  Initiate sliding scale insulin.  #4 essential hypertension: Blood pressure appears controlled.  Normally on metoprolol and lisinopril.  We will confirm on resume.  #5 hyperlipidemia: Confirm med rec.  Normally takes Zocor.  Will resume.  #6 history of bladder cancer: Status post recent surgery.  Continue per urology.  #7 obstructive sleep apnea: We will offer CPAP at night.   DVT prophylaxis: Lovenox Code Status: Full code Family Communication: Wife at bedside Disposition Plan: To be determined Consults called: Dr. Tresa Moore, urology Admission status: Inpatient  Severity of Illness: The appropriate patient status for this patient is INPATIENT. Inpatient status is judged to be reasonable and necessary in order to provide the required intensity of service to ensure the patient's safety. The patient's presenting symptoms, physical exam findings, and initial radiographic and laboratory data in the context of their chronic comorbidities is felt to place them at high risk for further clinical deterioration.  Furthermore, it is not anticipated that the patient will be medically stable for discharge from the hospital within 2 midnights of admission. The following factors support the patient status of inpatient.   " The patient's presenting symptoms include generalized weakness and malaise. " The worrisome physical exam findings include dry mucous membranes. " The initial radiographic and laboratory data are worrisome because of evidence of dehydration. " The chronic co-morbidities include  bladder cancer.   * I certify that at the point of admission it is my clinical judgment that the patient will require inpatient hospital care spanning beyond 2 midnights from the point of admission due to high intensity of service, high risk for further deterioration and high frequency of surveillance required.Barbette Merino MD [facility] Pager [phone]  If 7PM-7AM, please contact night-coverage [email or web address] Password [facility]  06/07/2021, 11:31 PM

## 2021-06-07 NOTE — ED Provider Notes (Signed)
Emergency Medicine Provider Triage Evaluation Note  [name] , a 80 y.o. male  was evaluated in triage.  Pt complains of postop complications.  Had surgery 3 weeks ago to remove a tumor in his bladder, has been feeling terrible ever since.  Feels weaker than normal, has not wanting to eat or drink.  Was ambulatory before, not willing to ambulate due to weakness now.  Family is concerned he is not doing well, his home health nurse that his urine looked cloudy.  Review of Systems  Positive: Weakness, decreased appetite, fatigue Negative: Chest pain, fevers  Physical Exam  There were no vitals taken for this visit. Gen:   Patient appears weak Resp:  Normal effort  MSK:   Moves extremities without difficulty  Other:  Irregular rhythm  Medical Decision Making  Medically screening exam initiated at 5:30 PM.  Appropriate orders placed.  [name]. was informed that the remainder of the evaluation will be completed by another provider, this initial triage assessment does not replace that evaluation, and the importance of remaining in the ED until their evaluation is complete.  Patient is hypotensive, next patient to go back.  Concern for possible sepsis.   Sherrill Raring, PA-C 06/07/21 1733    Daleen Bo, MD 06/07/21 2351

## 2021-06-07 NOTE — ED Triage Notes (Signed)
Pt arrived via POV. Pt has hx of bladder cancer. Pt had bladder removed this past week. Pt has become steadily weaker and has had cloudy urine from his supra pubic catheter. Pt is currently unable to walk, which is his baseline.

## 2021-06-07 NOTE — ED Provider Notes (Signed)
[facility] DEPT Provider Note   CSN: [id number] Arrival date & time: 06/07/21  1715     History Chief Complaint  Patient presents with   Post-op Problem    Guerino Marczak. is a 80 y.o. male.  HPI     3 weeks status post bladder removal. Increasingly deconditioned, fatigued, not eating, not drinking, dehydrated, can't get up to get out of bed. Gets very nauseated with eating. Has been having hallucinations about "men after him" in his bedroom. He has been talking to the wall, seemingly hallucinating. Has not been sleeping. Has home health nursing 2-3 days a week. Family feels that their concerns have been dismissed. Has been having more cloudy urine recently and home health nursing advised that he present to the ED.   Past Medical History:  Diagnosis Date   Anginal pain ([place]) 1997   Coronary artery disease    First degree AV block 01/16/2021   Noted on EKG   History of kidney stones    Hypercholesteremia    Hypertension    Neuromuscular disorder ([place])    peripheral neuropathy   OSA (obstructive sleep apnea) 08/05/2013   Pre-diabetes    Right bundle branch block (RBBB) 01/16/2021   Noted on EKG   Sleep apnea    Tourette syndrome    Tourette's syndrome 04/01/2013    Patient Active Problem List   Diagnosis Date Noted   SIRS (systemic inflammatory response syndrome) ([place]) 06/07/2021   Bladder cancer ([place]) 05/19/2021   Urinary incontinence 03/28/2021   Mixed hyperlipidemia 03/21/2021   Essential hypertension 03/21/2021   Type 2 diabetes mellitus ([place]) 03/21/2021   Malignant neoplasm of overlapping sites of bladder ([place]) 01/17/2021   OSA (obstructive sleep apnea) 08/05/2013   Tourette's syndrome 04/01/2013    Past Surgical History:  Procedure Laterality Date   [place]   CATARACT EXTRACTION  11/2012   CYSTOSCOPY WITH INJECTION N/A 05/19/2021   Procedure: CYSTOSCOPY WITH INJECTION OF  INDOCYANINE GREEN DYE;  Surgeon: Alexis Frock, MD;  Location: [facility];  Service: Urology;  Laterality: N/A;   heart stent  01/16/1996   LYMPH NODE DISSECTION Bilateral 05/19/2021   Procedure: LYMPH NODE DISSECTION;  Surgeon: Alexis Frock, MD;  Location: [facility];  Service: Urology;  Laterality: Bilateral;   ROBOT ASSISTED LAPAROSCOPIC COMPLETE CYSTECT ILEAL CONDUIT N/A 05/19/2021   Procedure: XI ROBOTIC ASSISTED LAPAROSCOPIC COMPLETE CYSTECT ILEAL CONDUIT;  Surgeon: Alexis Frock, MD;  Location: [facility];  Service: Urology;  Laterality: N/A;   ROBOT ASSISTED LAPAROSCOPIC RADICAL PROSTATECTOMY N/A 05/19/2021   Procedure: XI ROBOTIC ASSISTED LAPAROSCOPIC RADICAL PROSTATECTOMY;  Surgeon: Alexis Frock, MD;  Location: [facility];  Service: Urology;  Laterality: N/A;   TRANSURETHRAL RESECTION OF BLADDER TUMOR Bilateral 01/17/2021   Procedure: TRANSURETHRAL RESECTION OF BLADDER TUMOR (TURBT);  Surgeon: Irine Seal, MD;  Location: [facility];  Service: Urology;  Laterality: Bilateral;  GENERAL ANESTHESIA WITH  PARLYSIS   TRANSURETHRAL RESECTION OF BLADDER TUMOR Left 02/03/2021   Procedure: TRANSURETHRAL RESECTION OF BLADDER TUMOR (TURBT);  Surgeon: Irine Seal, MD;  Location: [facility];  Service: Urology;  Laterality: Left;  WITH PARALYPIC       Family History  Problem Relation Age of Onset   Pancreatic cancer Mother    Heart disease Father     Social History   Tobacco Use   Smoking status: Former    Types: Cigarettes    Quit date: 05/18/1996    Years  since quitting: 25.0   Smokeless tobacco: Never   Tobacco comments:    Quit over 20 years ago  Vaping Use   Vaping Use: Never used  Substance Use Topics   Alcohol use: No    Alcohol/week: 0.0 standard drinks   Drug use: No    Home Medications Prior to Admission medications   Medication Sig Start Date End Date Taking? Authorizing Provider  acetaminophen (TYLENOL) 500 MG tablet Take 1,000 mg by mouth every 6 (six) hours as needed for mild pain.    Yes [provider]  aspirin 81 MG EC tablet Take 81 mg by mouth daily. Swallow whole.   Yes [provider]  cetirizine (ZYRTEC) 10 MG tablet Take 10 mg by mouth at bedtime.   Yes [provider]  Coenzyme Q10 (COQ-10) 100 MG CAPS Take 100 mg by mouth 2 (two) times daily.   Yes [provider]  guanFACINE (TENEX) 1 MG tablet Take 2 tablets (2 mg total) by mouth at bedtime. 01/30/21  Yes Star Age, MD  lisinopril (ZESTRIL) 2.5 MG tablet Take 2.5 mg by mouth in the morning.   Yes [provider]  metoprolol succinate (TOPROL-XL) 25 MG 24 hr tablet Take 25 mg by mouth in the morning and at bedtime. 02/02/13  Yes [provider]  Multiple Vitamins-Minerals (MULTI FOR HIM 50+) TABS Take 1 tablet by mouth daily.   Yes [provider]  simvastatin (ZOCOR) 20 MG tablet Take 20 mg by mouth at bedtime.   Yes [provider]  traMADol (ULTRAM) 50 MG tablet Take 1 - 2 tablets by mouth every 6 hours as needed for moderate to severe pain (post-operatively). 05/25/21 05/25/22 Yes Alexis Frock, MD  vitamin C (ASCORBIC ACID) 500 MG tablet Take 500 mg by mouth daily.   Yes [provider]    Allergies    Patient has no known allergies.  Review of Systems   Review of Systems  Constitutional:  Positive for appetite change and fatigue. Negative for chills and fever.  HENT:  Negative for ear pain and sore throat.   Eyes:  Negative for pain and visual disturbance.  Respiratory:  Negative for cough and shortness of breath.   Cardiovascular:  Negative for chest pain and palpitations.  Gastrointestinal:  Positive for abdominal pain and nausea. Negative for vomiting.  Musculoskeletal:  Negative for arthralgias and back pain.  Skin:  Negative for color change and rash.  Neurological:  Negative for seizures and syncope.  All other systems reviewed and are negative.  Physical Exam Updated Vital Signs BP 126/89 (BP Location: Right Arm)    Pulse 71   Temp 98 F (36.7 C) (Oral)   Resp 17   Ht '5\' 10"'$  (1.778 m)   Wt 88 kg   SpO2 98%   BMI 27.84 kg/m   Physical Exam Vitals and nursing note reviewed.  Constitutional:      General: He is not in acute distress.    Appearance: He is well-developed. He is ill-appearing.  HENT:     Head: Normocephalic and atraumatic.     Mouth/Throat:     Mouth: Mucous membranes are dry.  Eyes:     Conjunctiva/sclera: Conjunctivae normal.  Cardiovascular:     Rate and Rhythm: Normal rate and regular rhythm.     Heart sounds: No murmur heard. Pulmonary:     Effort: Pulmonary effort is normal. No respiratory distress.     Breath sounds: Normal breath sounds.  Abdominal:  Palpations: Abdomen is soft.     Tenderness: There is abdominal tenderness.     Comments: Catheter in place from ilial conduit, draining urine  Musculoskeletal:        General: No swelling or tenderness.     Cervical back: Neck supple.  Skin:    General: Skin is warm and dry.  Neurological:     Mental Status: He is alert.     Cranial Nerves: No cranial nerve deficit.     Sensory: No sensory deficit.     Motor: No weakness.     Coordination: Coordination normal.    ED Results / Procedures / Treatments   Labs (all labs ordered are listed, but only abnormal results are displayed) Labs Reviewed  LACTIC ACID, PLASMA - Abnormal; Notable for the following components:      Result Value   Lactic Acid, Venous 2.5 (*)    All other components within normal limits  COMPREHENSIVE METABOLIC PANEL - Abnormal; Notable for the following components:   Glucose, Bld 184 (*)    BUN 36 (*)    Albumin 2.7 (*)    All other components within normal limits  CBC WITH DIFFERENTIAL/PLATELET - Abnormal; Notable for the following components:   WBC 15.0 (*)    RBC 4.10 (*)    Hemoglobin 11.6 (*)    HCT 37.2 (*)    Neutro Abs 11.9 (*)    Abs Immature Granulocytes 0.52 (*)    All other components within normal limits   URINALYSIS, ROUTINE W REFLEX MICROSCOPIC - Abnormal; Notable for the following components:   APPearance TURBID (*)    pH 9.0 (*)    Hgb urine dipstick SMALL (*)    Protein, ur 100 (*)    Leukocytes,Ua MODERATE (*)    RBC / HPF >50 (*)    WBC, UA >50 (*)    Bacteria, UA RARE (*)    All other components within normal limits  HEMOGLOBIN A1C - Abnormal; Notable for the following components:   Hgb A1c MFr Bld 6.7 (*)    All other components within normal limits  COMPREHENSIVE METABOLIC PANEL - Abnormal; Notable for the following components:   Glucose, Bld 149 (*)    BUN 28 (*)    Calcium 8.5 (*)    Total Protein 5.7 (*)    Albumin 2.1 (*)    Total Bilirubin 0.2 (*)    All other components within normal limits  CBC - Abnormal; Notable for the following components:   WBC 11.1 (*)    RBC 3.38 (*)    Hemoglobin 9.7 (*)    HCT 30.0 (*)    All other components within normal limits  GLUCOSE, CAPILLARY - Abnormal; Notable for the following components:   Glucose-Capillary 123 (*)    All other components within normal limits  GLUCOSE, CAPILLARY - Abnormal; Notable for the following components:   Glucose-Capillary 175 (*)    All other components within normal limits  GLUCOSE, CAPILLARY - Abnormal; Notable for the following components:   Glucose-Capillary 126 (*)    All other components within normal limits  CBG MONITORING, ED - Abnormal; Notable for the following components:   Glucose-Capillary 168 (*)    All other components within normal limits  CBG MONITORING, ED - Abnormal; Notable for the following components:   Glucose-Capillary 145 (*)    All other components within normal limits  CULTURE, BLOOD (ROUTINE X 2)  CULTURE, BLOOD (ROUTINE X 2)  RESP PANEL BY RT-PCR (FLU  A&B, COVID) ARPGX2  URINE CULTURE  LACTIC ACID, PLASMA  PROTIME-INR  APTT  MAGNESIUM  TSH  AMMONIA  PATHOLOGIST SMEAR REVIEW  CBC  COMPREHENSIVE METABOLIC PANEL    EKG EKG  Interpretation  Date/Time:  Wednesday June 07 2021 17:57:20 EDT Ventricular Rate:  91 PR Interval:  175 QRS Duration: 139 QT Interval:  373 QTC Calculation: 459 R Axis:   -12 Text Interpretation: Sinus rhythm Multiple premature complexes, vent & supraven Right bundle branch block Inferior infarct, old since last tracing no significant change Confirmed by Daleen Bo [id number]) on 06/07/2021 6:17:04 PM  Radiology CT HEAD WO CONTRAST (5MM)  Result Date: 06/07/2021 CLINICAL DATA:  Mental status change, unknown cause EXAM: CT HEAD WITHOUT CONTRAST TECHNIQUE: Contiguous axial images were obtained from the base of the skull through the vertex without intravenous contrast. COMPARISON:  None. FINDINGS: Brain: No acute intracranial abnormality. Specifically, no hemorrhage, hydrocephalus, mass lesion, acute infarction, or significant intracranial injury. Mild age related volume loss. Vascular: No hyperdense vessel or unexpected calcification. Skull: No acute calvarial abnormality. Sinuses/Orbits: No acute findings Other: None IMPRESSION: No acute intracranial abnormality. Electronically Signed   By: Rolm Baptise M.D.   On: 06/07/2021 19:33   CT ABDOMEN PELVIS W CONTRAST  Result Date: 06/07/2021 CLINICAL DATA:  Recent bladder surgery weakness EXAM: CT ABDOMEN AND PELVIS WITH CONTRAST TECHNIQUE: Multidetector CT imaging of the abdomen and pelvis was performed using the standard protocol following bolus administration of intravenous contrast. CONTRAST:  8m OMNIPAQUE IOHEXOL 350 MG/ML SOLN COMPARISON:  CT 02/17/2021, 01/03/2021 FINDINGS: Lower chest: Lung bases demonstrate no acute consolidation or effusion. Normal cardiac size. Hepatobiliary: No focal liver abnormality is seen. No gallstones, gallbladder wall thickening, or biliary dilatation. Pancreas: Unremarkable. No pancreatic ductal dilatation or surrounding inflammatory changes. Spleen: Normal in size without focal abnormality. Adrenals/Urinary Tract:  Adrenal glands are normal. Lower pole right renal sinus cyst. Multiple bilateral kidney stones. Interval cystectomy and right lower quadrant urostomy. Placement of bilateral ureteral stents. Interim development of mild right and moderate left hydronephrosis. Stomach/Bowel: The stomach is nonenlarged. No dilated small bowel. No acute bowel wall thickening. Vascular/Lymphatic: Advanced aortic atherosclerosis. 3.7 cm infrarenal abdominal aortic aneurysm. No suspicious nodes Reproductive: Status post prostatectomy Other: Small pelvic effusion. No free air. Moderate gas in the subcutaneous soft tissues Musculoskeletal: No acute osseous abnormality. Degenerative changes of the spine. IMPRESSION: 1. Interval prostatectomy and cystectomy and right lower quadrant urostomy. Interim development of mild right and moderate left hydronephrosis with ureteral stents in place. Correlate for stent function. 2. Small fluid in the pelvis. Moderate gas in the subcutaneous soft tissues of the abdominal wall presumably due to postsurgical gas. 3. Infrarenal abdominal aortic aneurysm up to 3.7 cm. Recommend follow-up ultrasound every 2 years. This recommendation follows ACR consensus guidelines: White Paper of the ACR Incidental Findings Committee II on Vascular Findings. J Am Coll Radiol 2013; 10:789-794. Electronically Signed   By: KDonavan FoilM.D.   On: 06/07/2021 19:48   [place]  Result Date: 06/08/2021 CLINICAL DATA:  Elevated serum creatinine EXAM: RENAL / URINARY TRACT ULTRASOUND COMPLETE COMPARISON:  None. FINDINGS: Right Kidney: Renal measurements: 12.1 x 6.1 x 5.1 cm = volume: 197 mL. No hydronephrosis. There is a 5.4 x 3.9 x 3.1 cm cystic lesion in the lower pole of the right kidney. This appears simple. No nephrolithiasis visualized. Left Kidney: Renal measurements: 13.0 x 5.7 x 5.6 cm = volume: 215 mL. Moderate hydronephrosis. No cystic or solid lesion. No nephrolithiasis visualized. Bladder: Prior  cystectomy Other:  None. IMPRESSION: Moderate left-sided hydronephrosis. No right-sided hydronephrosis. 5.4 cm simple appearing right lower pole renal cyst. Electronically Signed   By: Maurine Simmering M.D.   On: 06/08/2021 13:49   DG Chest Port 1 View  Result Date: 06/07/2021 CLINICAL DATA:  Chest pain, cough. EXAM: PORTABLE CHEST 1 VIEW COMPARISON:  None. FINDINGS: The heart size and mediastinal contours are within normal limits. Both lungs are clear. The visualized skeletal structures are unremarkable. IMPRESSION: No active disease. Aortic Atherosclerosis (ICD10-I70.0). Electronically Signed   By: Marijo Conception M.D.   On: 06/07/2021 18:36    Procedures Procedures   Medications Ordered in ED Medications  enoxaparin (LOVENOX) injection 40 mg (40 mg Subcutaneous Given 06/08/21 0907)  acetaminophen (TYLENOL) tablet 650 mg (has no administration in time range)    Or  acetaminophen (TYLENOL) suppository 650 mg (has no administration in time range)  ondansetron (ZOFRAN) tablet 4 mg (has no administration in time range)    Or  ondansetron (ZOFRAN) injection 4 mg (has no administration in time range)  cefTRIAXone (ROCEPHIN) 1 g in sodium chloride 0.9 % 100 mL IVPB (0 g Intravenous Stopped 06/08/21 0313)  insulin aspart (novoLOG) injection 0-15 Units (3 Units Subcutaneous Given 06/08/21 1643)  insulin aspart (novoLOG) injection 0-5 Units (0 Units Subcutaneous Not Given 06/08/21 2159)  LORazepam (ATIVAN) tablet 0.5 mg (0.5 mg Oral Given 06/08/21 2157)  0.9 %  sodium chloride infusion ( Intravenous New Bag/Given 06/08/21 1755)  multivitamin with minerals tablet 1 tablet (1 tablet Oral Given 06/08/21 1637)  lactated ringers bolus 1,000 mL (1,000 mLs Intravenous Bolus 06/07/21 1847)  iohexol (OMNIPAQUE) 350 MG/ML injection 80 mL (80 mLs Intravenous Contrast Given 06/07/21 1915)    ED Course  I have reviewed the triage vital signs and the nursing notes.  Pertinent labs & imaging results that were available during my care of the  patient were reviewed by me and considered in my medical decision making (see chart for details).    MDM Rules/Calculators/A&P                           In the last 24 hours that was unremarkable.  80 year old male status postop from ileal conduit surgery and radical prostatectomy and cystectomy presenting to the emergency department with worsening abdominal pain, fatigue, malaise, decreased p.o. intake.  Patient meets SIRS criteria on arrival with tachycardia and a leukocytosis.   Concern for possible urinary tract infection versus other source.  Will obtain broad work-up and reassess.  Initial lactic acid elevated to 2.5.  IV fluids were provided for volume resuscitation.  Suspect potentially hypovolemia in the setting of poor p.o. intake postoperatively however infection also considered on the differential.  DDx: Concern for possible postoperative infection, intra-abdominal abscess, pneumonia, urinary infection, COVID-19, URI, hypovolemia from poor p.o. intake.  On arrival, the patient was afebrile, temperature 98.2, borderline soft pressures, 97/85, tachycardic, pulse 114, respiratory rate of 21% saturating 93% on room air.  The patient was found to have a leukocytosis to 15.1.  He had a mildly elevated BUN to 36, otherwise chemistry WNL.  Lactic acidosis as noted cleared following fluid resuscitation.  CT of the head without acute findings.  CT of the abdomen pelvis showed interval prostatectomy and cystectomy with right lower quadrant urostomy with mild right and moderate left hydronephrosis with stents in place.  Low concern for stent malfunction given the patient is currently making urine.  Infrarenal abdominal aortic  aneurysm of 3.7 cm also noted.  I spoke with urology over the phone.  The patient's urine studies were positive for urinary tract infection.  In the setting postoperatively of ileal conduit surgery, urology recommended treating for presumed urinary tract infection.  The patient  was administered IV Rocephin.  Lactic acidosis cleared following volume resuscitation.  19 influenza PCR resulted normal, blood cultures and urine cultures collected and pending, collected prior to antibiotic administration.  Given the concern for hypovolemia, dehydration in the setting of poor p.o. intake, patient meeting positive SIRS criteria, concern for urinary tract infection postoperatively from ileal conduit surgery, hospitalist medicine was consulted for admission.  Final Clinical Impression(s) / ED Diagnoses Final diagnoses:  Urinary tract infection without hematuria, site unspecified  Hypovolemia  Dehydration    Rx / DC Orders ED Discharge Orders     None        Regan Lemming, MD 06/08/21 2203

## 2021-06-08 ENCOUNTER — Inpatient Hospital Stay (HOSPITAL_COMMUNITY): Payer: Medicare HMO

## 2021-06-08 DIAGNOSIS — R6510 Systemic inflammatory response syndrome (SIRS) of non-infectious origin without acute organ dysfunction: Secondary | ICD-10-CM | POA: Diagnosis not present

## 2021-06-08 LAB — HEMOGLOBIN A1C
Hgb A1c MFr Bld: 6.7 % — ABNORMAL HIGH (ref 4.8–5.6)
Mean Plasma Glucose: 145.59 mg/dL

## 2021-06-08 LAB — PATHOLOGIST SMEAR REVIEW: Path Review: REACTIVE

## 2021-06-08 LAB — COMPREHENSIVE METABOLIC PANEL
ALT: 35 U/L (ref 0–44)
AST: 27 U/L (ref 15–41)
Albumin: 2.1 g/dL — ABNORMAL LOW (ref 3.5–5.0)
Alkaline Phosphatase: 78 U/L (ref 38–126)
Anion gap: 7 (ref 5–15)
BUN: 28 mg/dL — ABNORMAL HIGH (ref 8–23)
CO2: 25 mmol/L (ref 22–32)
Calcium: 8.5 mg/dL — ABNORMAL LOW (ref 8.9–10.3)
Chloride: 104 mmol/L (ref 98–111)
Creatinine, Ser: 0.84 mg/dL (ref 0.61–1.24)
GFR, Estimated: >60 mL/min (ref >60)
Glucose, Bld: 149 mg/dL — ABNORMAL HIGH (ref 70–99)
Potassium: 3.8 mmol/L (ref 3.5–5.1)
Sodium: 136 mmol/L (ref 135–145)
Total Bilirubin: 0.2 mg/dL — ABNORMAL LOW (ref 0.3–1.2)
Total Protein: 5.7 g/dL — ABNORMAL LOW (ref 6.5–8.1)

## 2021-06-08 LAB — CBC
HCT: 30 % — ABNORMAL LOW (ref 39.0–52.0)
Hemoglobin: 9.7 g/dL — ABNORMAL LOW (ref 13.0–17.0)
MCH: 28.7 pg (ref 26.0–34.0)
MCHC: 32.3 g/dL (ref 30.0–36.0)
MCV: 88.8 fL (ref 80.0–100.0)
Platelets: 260 10*3/uL (ref 150–400)
RBC: 3.38 MIL/uL — ABNORMAL LOW (ref 4.22–5.81)
RDW: 14.6 % (ref 11.5–15.5)
WBC: 11.1 10*3/uL — ABNORMAL HIGH (ref 4.0–10.5)
nRBC: 0 % (ref 0.0–0.2)

## 2021-06-08 LAB — GLUCOSE, CAPILLARY
Glucose-Capillary: 123 mg/dL — ABNORMAL HIGH (ref 70–99)
Glucose-Capillary: 175 mg/dL — ABNORMAL HIGH (ref 70–99)
Glucose-Capillary: 126 mg/dL — ABNORMAL HIGH (ref 70–99)

## 2021-06-08 LAB — CBG MONITORING, ED
Glucose-Capillary: 145 mg/dL — ABNORMAL HIGH (ref 70–99)
Glucose-Capillary: 168 mg/dL — ABNORMAL HIGH (ref 70–99)

## 2021-06-08 MED ORDER — SODIUM CHLORIDE 0.9 % IV SOLN: INTRAVENOUS | Status: DC

## 2021-06-08 MED ORDER — LORAZEPAM 0.5 MG PO TABS
0.5000 mg | ORAL_TABLET | Freq: Every day | ORAL | Status: DC
  Administered 2021-06-08 – 2021-06-09 (×3): 0.5 mg via ORAL
  Filled 2021-06-08 (×3): qty 1

## 2021-06-08 MED ORDER — ADULT MULTIVITAMIN W/MINERALS CH
1.0000 | ORAL_TABLET | Freq: Every day | ORAL | Status: DC
  Administered 2021-06-08: 1 via ORAL
  Filled 2021-06-08 (×2): qty 1

## 2021-06-08 NOTE — ED Notes (Signed)
Dr. Jonelle Sidle at bedside. Refreshments provided to pt

## 2021-06-08 NOTE — ED Notes (Signed)
RT called for CPAP

## 2021-06-08 NOTE — Progress Notes (Signed)
Initial Nutrition Assessment  DOCUMENTATION CODES:  Non-severe (moderate) malnutrition in context of chronic illness  INTERVENTION:  Add Vital Cuisine Shake BID, each supplement provides 520 kcal and 22 grams of protein.  Add Magic cup TID with meals, each supplement provides 290 kcal and 9 grams of protein.  Add MVI with minerals daily.  NUTRITION DIAGNOSIS:  Moderate Malnutrition related to chronic illness, cancer and cancer related treatments as evidenced by mild fat depletion, moderate fat depletion, mild muscle depletion, moderate muscle depletion.  GOAL:  Patient will meet greater than or equal to 90% of their needs  MONITOR:  PO intake, Supplement acceptance, Labs, Weight trends, Skin, I & O's  REASON FOR ASSESSMENT:  Malnutrition Screening Tool    ASSESSMENT:  80 yo male with a PMH of bladder cancer s/p cystectomy, prostatectomy and ileal conduit surgery on July 22, coronary artery disease, hyperlipidemia, essential hypertension, obstructive sleep apnea, Tourette's syndrome among other things who was brought in today secondary to progressive weakness failure to thrive at home and generalized malaise since his surgery.  Per family he has been declining with poor oral intake.  Patient was noted to be dehydrated.  Received fluid resuscitation in the ER.  Still lethargic and weak.  Urinalysis not impressive for a UTI but he meets SIRS criteria.  Pt very lethargic during RD visit, continued to fall asleep and hard to wake during visit. Pt reports that he thinks he has been eating well PTA; no one in room to confirm this. Per Epic, pt ate 50% of breakfast this morning.  Weight appears to be copied from previous admission. RD to order measured weight to determine if pt is having weight loss.  On exam, pt with mild to moderate depletions.  Recommend adding Hormel shake BID and Magic Cup TID to promote intake, as well as MVI with minerals daily.  Medications: reviewed; mealtime  Novolog, bedtime Novolog, Ativan at bedtime, NaCl @ 100 ml/hr via IV, ceftriaxone via IV  Labs: reviewed; CBG 123-168 (H) HbA1c: 6.7% (05/2021)  NUTRITION - FOCUSED PHYSICAL EXAM: Flowsheet Row Most Recent Value  Orbital Region Moderate depletion  Upper Arm Region Moderate depletion  Thoracic and Lumbar Region Mild depletion  Buccal Region Moderate depletion  Temple Region Moderate depletion  Clavicle Bone Region Mild depletion  Clavicle and Acromion Bone Region Mild depletion  Scapular Bone Region No depletion  Dorsal Hand No depletion  Patellar Region Moderate depletion  Anterior Thigh Region Mild depletion  Posterior Calf Region Moderate depletion  Edema (RD Assessment) Mild  [BLE]  Hair Reviewed  Eyes Reviewed  Mouth Reviewed  Skin Reviewed  Nails Reviewed   Diet Order:   Diet Order             Diet regular Room service appropriate? Yes; Fluid consistency: Thin  Diet effective now                  EDUCATION NEEDS:  Education needs have been addressed  Skin:  Skin Assessment: Skin Integrity Issues: Skin Integrity Issues:: Incisions Incisions: Abdomen, closed  Last BM:  06/08/21  Height:  Ht Readings from Last 1 Encounters:  06/07/21 '5\' 10"'$  (1.778 m)   Weight:  Wt Readings from Last 1 Encounters:  06/07/21 88 kg   BMI:  Body mass index is 27.84 kg/m.  Estimated Nutritional Needs:  Kcal:  2200-2400 Protein:  100-115 grams Fluid:  >2.2 L  Derrel Nip, RD, LDN (she/her/hers) Registered Dietitian I After-Hours/Weekend Pager # in [place]

## 2021-06-08 NOTE — Progress Notes (Signed)
PROGRESS NOTE    [name].  RKY:[id number] DOB: 11/05/1940 DOA: 06/07/2021 PCP: Celene Squibb, MD.   Brief Narrative:  [name]. is a 80 y.o. male with medical history significant of bladder cancer s/p cystectomy, prostatectomy and ileal conduit surgery on July 22, coronary artery disease, hyperlipidemia, essential hypertension, obstructive sleep apnea, Tourette's syndrome among other things who was brought in today secondary to progressive weakness failure to thrive at home and generalized malaise since his surgery.  Per family he has been declining with poor oral intake.  Patient was noted to be dehydrated.  Received fluid resuscitation in the ER.  Still lethargic and weak.  Urinalysis not impressive for a UTI but he meets SIRS criteria.  ER physician has discussed with urology who recommends treating patient as if he has a UTI postoperatively.  Patient therefore being admitted to the hospital for further evaluation and treatment.   ED Course: Temperature is 98.2 blood pressure 97/85, pulse 114, respiratory of 21 oxygen sat 93% on room air.  White count 15.1 hemoglobin 11.1 platelets 331.  Chemistry showed BUN 36 otherwise mostly within normal.  Lactic acid is 2.5.  Head CT without contrast showed no acute findings CT abdomen pelvis showed interval prostatectomy and cystectomy with right lower quadrant urostomy.  There is mild right and moderate left hydronephrosis with stents in place.  Also infrarenal abdominal aortic aneurysm of 3.7 cm.  Patient being admitted for treatment by urology    Assessment & Plan:   Principal Problem:   SIRS (systemic inflammatory response syndrome) (HCC) Active Problems:   OSA (obstructive sleep apnea)   Bladder cancer (HCC)   Mixed hyperlipidemia   Essential hypertension   Type 2 diabetes mellitus ([place])    #1 sepsis patient met sepsis criteria on admission with hypotension 97/85, tachycardia with a heart rate above 100 and tachypnea with  leukocytosis. Patient admitted with generalized weakness decreased p.o. intake unable to function at home or do his ADLs.  He is status post prostatectomy cystectomy and ilial conduit for bladder cancer on May 19, 2021.  ED physician discussed with urology who recommended antibiotics and follow-up urine culture. His white count is coming down/trending down 11.1 from 15.0 on admission.  His lactic acid is 1.4. CT of the abdomen and pelvis shows mild right and moderate left hydronephrosis with stents in place.  Infrarenal abdominal aortic aneurysm 3.7 cm recommending CT q. 2 years. Will check renal ultrasound. Renal function stable. Change IV fluids to normal saline DC D5 half-normal.  His sodium is trending down. Pt consult  #2 history of essential hypertension patient takes Zestril and Toprol at home we will continue to hold this since his blood pressure is soft.  #3 obstructive sleep apnea continue CPAP  #4 hyperlipidemia on statin continue  #5 type 2 diabetes not on any treatments at home.  His hemoglobin A1c was 6.5 in March 2022.on ssi. CBG (last 3)  Recent Labs    06/08/21 0002 06/08/21 0749  GLUCAP 168* 145*     Estimated body mass index is 27.84 kg/m as calculated from the following:   Height as of this encounter: _0  (1.778 m).   Weight as of this encounter: 88 kg.  DVT prophylaxis: lovenox Code Status: full Family Communication: none at bed side  Disposition Plan:  Status is: Inpatient  Remains inpatient appropriate because:IV treatments appropriate due to intensity of illness or inability to take PO  Dispo: The patient is from: Home  Anticipated d/c is to: SNF              Patient currently is not medically stable to d/c.   Difficult to place patient No       Consultants: Urology  Procedures: None Antimicrobials: Rocephin  Subjective: Patient is resting in bed with CPAP in place No overnight events noted Afebrile  overnight  Objective: Vitals:   06/08/21 0300 06/08/21 0400 06/08/21 0500 06/08/21 0800  BP: 123/65 134/63 120/76 107/86  Pulse: 86 83 71 82  Resp: _0 Temp:      TempSrc:      SpO2: 95% 97% 99% 98%  Weight:      Height:        Intake/Output Summary (Last 24 hours) at 06/08/2021 0847 Last data filed at 06/08/2021 0313 Gross per 24 hour  Intake 100 ml  Output 1100 ml  Net -1000 ml   Filed Weights   06/07/21 1737  Weight: 88 kg    Examination:  General exam: Appears calm and comfortable  Respiratory system: Clear to auscultation. Respiratory effort normal. Cardiovascular system: S1 & S2 heard, RRR. No JVD, murmurs, rubs, gallops or clicks. No pedal edema. Gastrointestinal system: Abdomen is nondistended, soft and nontender. No organomegaly or masses felt. Normal bowel sounds heard. Central nervous system: Alert and oriented. No focal neurological deficits. Extremities: Symmetric 5 x 5 power. Skin: No rashes, lesions or ulcers Psychiatry: Judgement and insight appear normal. Mood & affect appropriate.     Data Reviewed: I have personally reviewed following labs and imaging studies  CBC: Recent Labs  Lab 06/07/21 1752 06/08/21 0426  WBC 15.0* 11.1*  NEUTROABS 11.9*  --   HGB 11.6* 9.7*  HCT 37.2* 30.0*  MCV 90.7 88.8  PLT 331 196   Basic Metabolic Panel: Recent Labs  Lab 06/07/21 1752 06/07/21 1821 06/08/21 0426  NA 137  --  136  K 4.5  --  3.8  CL 103  --  104  CO2 23  --  25  GLUCOSE 184*  --  149*  BUN 36*  --  28*  CREATININE 1.11  --  0.84  CALCIUM 9.6  --  8.5*  MG  --  1.7  --    GFR: Estimated Creatinine Clearance: 75.8 mL/min (by C-G formula based on SCr of 0.84 mg/dL). Liver Function Tests: Recent Labs  Lab 06/07/21 1752 06/08/21 0426  AST 30 27  ALT 44 35  ALKPHOS 99 78  BILITOT 0.7 0.2*  PROT 6.8 5.7*  ALBUMIN 2.7* 2.1*   No results for input(s): LIPASE, AMYLASE in the last 168 hours. Recent Labs  Lab 06/07/21 1822   AMMONIA 14   Coagulation Profile: Recent Labs  Lab 06/07/21 1752  INR 1.1   Cardiac Enzymes: No results for input(s): CKTOTAL, CKMB, CKMBINDEX, TROPONINI in the last 168 hours. BNP (last 3 results) No results for input(s): PROBNP in the last 8760 hours. HbA1C: No results for input(s): HGBA1C in the last 72 hours. CBG: Recent Labs  Lab 06/08/21 0002 06/08/21 0749  GLUCAP 168* 145*   Lipid Profile: No results for input(s): CHOL, HDL, LDLCALC, TRIG, CHOLHDL, LDLDIRECT in the last 72 hours. Thyroid Function Tests: Recent Labs    06/07/21 1822  TSH 2.768   Anemia Panel: No results for input(s): VITAMINB12, FOLATE, FERRITIN, TIBC, IRON, RETICCTPCT in the last 72 hours. Sepsis Labs: Recent Labs  Lab 06/07/21 1751 06/07/21 2020  LATICACIDVEN 2.5* 1.4    Recent Results (  from the past 240 hour(s))  Blood Culture (routine x 2)     Status: None (Preliminary result)   Collection Time: 06/07/21  5:40 PM   Specimen: BLOOD RIGHT FOREARM  Result Value Ref Range Status   Specimen Description BLOOD RIGHT FOREARM  Final   Special Requests   Final    BOTTLES DRAWN AEROBIC AND ANAEROBIC Blood Culture results may not be optimal due to an excessive volume of blood received in culture bottles Performed at [facility], [place] [address], [place], [place] [address]    Culture PENDING  Incomplete   Report Status PENDING  Incomplete  Blood Culture (routine x 2)     Status: None (Preliminary result)   Collection Time: 06/07/21  5:55 PM   Specimen: BLOOD  Result Value Ref Range Status   Specimen Description   Final    BLOOD LEFT ANTECUBITAL Performed at [facility] [address], [place], [place] [address]    Special Requests   Final    BOTTLES DRAWN AEROBIC AND ANAEROBIC Blood Culture adequate volume Performed at [facility], [place] [address], [place], [place] [address]    Culture PENDING  Incomplete   Report Status PENDING  Incomplete  Resp Panel  by RT-PCR (Flu A&B, Covid) Nasopharyngeal Swab     Status: None   Collection Time: 06/07/21  6:30 PM   Specimen: Nasopharyngeal Swab; Nasopharyngeal(NP) swabs in vial transport medium  Result Value Ref Range Status   SARS Coronavirus 2 by RT PCR NEGATIVE NEGATIVE Final    Comment: (NOTE) SARS-CoV-2 target nucleic acids are NOT DETECTED.  The SARS-CoV-2 RNA is generally detectable in upper respiratory specimens during the acute phase of infection. The lowest concentration of SARS-CoV-2 viral copies this assay can detect is 138 copies/mL. A negative result does not preclude SARS-Cov-2 infection and should not be used as the sole basis for treatment or other patient management decisions. A negative result may occur with  improper specimen collection/handling, submission of specimen other than nasopharyngeal swab, presence of viral mutation(s) within the areas targeted by this assay, and inadequate number of viral copies(<138 copies/mL). A negative result must be combined with clinical observations, patient history, and epidemiological information. The expected result is Negative.  Fact Sheet for Patients:  EntrepreneurPulse.com.au  Fact Sheet for Healthcare Providers:  IncredibleEmployment.be  This test is no t yet approved or cleared by the [place] FDA and  has been authorized for detection and/or diagnosis of SARS-CoV-2 by FDA under an Emergency Use Authorization (EUA). This EUA will remain  in effect (meaning this test can be used) for the duration of the COVID-19 declaration under Section 564(b)(1) of the Act, 21 U.S.C.section 360bbb-3(b)(1), unless the authorization is terminated  or revoked sooner.       Influenza A by PCR NEGATIVE NEGATIVE Final   Influenza B by PCR NEGATIVE NEGATIVE Final    Comment: (NOTE) The Xpert Xpress SARS-CoV-2/FLU/RSV plus assay is intended as an aid in the diagnosis of influenza from Nasopharyngeal swab  specimens and should not be used as a sole basis for treatment. Nasal washings and aspirates are unacceptable for Xpert Xpress SARS-CoV-2/FLU/RSV testing.  Fact Sheet for Patients: EntrepreneurPulse.com.au  Fact Sheet for Healthcare Providers: IncredibleEmployment.be  This test is not yet approved or cleared by the [place] FDA and has been authorized for detection and/or diagnosis of SARS-CoV-2 by FDA under an Emergency Use Authorization (EUA). This EUA will remain in effect (meaning this test can be  used) for the duration of the COVID-19 declaration under Section 564(b)(1) of the Act, 21 U.S.C. section 360bbb-3(b)(1), unless the authorization is terminated or revoked.  Performed at [facility], [place] [address] [place], [place] [address]          Radiology Studies: CT HEAD WO CONTRAST (5MM)  Result Date: 06/07/2021 CLINICAL DATA:  Mental status change, unknown cause EXAM: CT HEAD WITHOUT CONTRAST TECHNIQUE: Contiguous axial images were obtained from the base of the skull through the vertex without intravenous contrast. COMPARISON:  None. FINDINGS: Brain: No acute intracranial abnormality. Specifically, no hemorrhage, hydrocephalus, mass lesion, acute infarction, or significant intracranial injury. Mild age related volume loss. Vascular: No hyperdense vessel or unexpected calcification. Skull: No acute calvarial abnormality. Sinuses/Orbits: No acute findings Other: None IMPRESSION: No acute intracranial abnormality. Electronically Signed   By: Rolm Baptise M.D.   On: 06/07/2021 19:33   CT ABDOMEN PELVIS W CONTRAST  Result Date: 06/07/2021 CLINICAL DATA:  Recent bladder surgery weakness EXAM: CT ABDOMEN AND PELVIS WITH CONTRAST TECHNIQUE: Multidetector CT imaging of the abdomen and pelvis was performed using the standard protocol following bolus administration of intravenous contrast. CONTRAST:  37m OMNIPAQUE IOHEXOL 350  MG/ML SOLN COMPARISON:  CT 02/17/2021, 01/03/2021 FINDINGS: Lower chest: Lung bases demonstrate no acute consolidation or effusion. Normal cardiac size. Hepatobiliary: No focal liver abnormality is seen. No gallstones, gallbladder wall thickening, or biliary dilatation. Pancreas: Unremarkable. No pancreatic ductal dilatation or surrounding inflammatory changes. Spleen: Normal in size without focal abnormality. Adrenals/Urinary Tract: Adrenal glands are normal. Lower pole right renal sinus cyst. Multiple bilateral kidney stones. Interval cystectomy and right lower quadrant urostomy. Placement of bilateral ureteral stents. Interim development of mild right and moderate left hydronephrosis. Stomach/Bowel: The stomach is nonenlarged. No dilated small bowel. No acute bowel wall thickening. Vascular/Lymphatic: Advanced aortic atherosclerosis. 3.7 cm infrarenal abdominal aortic aneurysm. No suspicious nodes Reproductive: Status post prostatectomy Other: Small pelvic effusion. No free air. Moderate gas in the subcutaneous soft tissues Musculoskeletal: No acute osseous abnormality. Degenerative changes of the spine. IMPRESSION: 1. Interval prostatectomy and cystectomy and right lower quadrant urostomy. Interim development of mild right and moderate left hydronephrosis with ureteral stents in place. Correlate for stent function. 2. Small fluid in the pelvis. Moderate gas in the subcutaneous soft tissues of the abdominal wall presumably due to postsurgical gas. 3. Infrarenal abdominal aortic aneurysm up to 3.7 cm. Recommend follow-up ultrasound every 2 years. This recommendation follows ACR consensus guidelines: White Paper of the ACR Incidental Findings Committee II on Vascular Findings. J Am Coll Radiol 2013; 10:789-794. Electronically Signed   By: KDonavan FoilM.D.   On: 06/07/2021 19:48   DG Chest Port 1 View  Result Date: 06/07/2021 CLINICAL DATA:  Chest pain, cough. EXAM: PORTABLE CHEST 1 VIEW COMPARISON:  None.  FINDINGS: The heart size and mediastinal contours are within normal limits. Both lungs are clear. The visualized skeletal structures are unremarkable. IMPRESSION: No active disease. Aortic Atherosclerosis (ICD10-I70.0). Electronically Signed   By: JMarijo ConceptionM.D.   On: 06/07/2021 18:36        Scheduled Meds:  enoxaparin (LOVENOX) injection  40 mg Subcutaneous Q24H   insulin aspart  0-15 Units Subcutaneous TID WC   insulin aspart  0-5 Units Subcutaneous QHS   LORazepam  0.5 mg Oral QHS   Continuous Infusions:  cefTRIAXone (ROCEPHIN)  IV Stopped (06/08/21 0313)   dextrose 5% lactated ringers 125 mL/hr at 06/08/21 0022     LOS: 1 day  Time spent: 45 min    Georgette Shell, MD  06/08/2021, 8:47 AM

## 2021-06-08 NOTE — ED Notes (Signed)
CPAP removed at 0805 and placed on [place] 2L

## 2021-06-09 ENCOUNTER — Inpatient Hospital Stay (HOSPITAL_COMMUNITY): Payer: Medicare HMO

## 2021-06-09 ENCOUNTER — Inpatient Hospital Stay (HOSPITAL_COMMUNITY): Payer: Medicare HMO | Admitting: Certified Registered Nurse Anesthetist

## 2021-06-09 ENCOUNTER — Encounter (HOSPITAL_COMMUNITY): Payer: Self-pay | Admitting: Internal Medicine

## 2021-06-09 DIAGNOSIS — E440 Moderate protein-calorie malnutrition: Secondary | ICD-10-CM | POA: Diagnosis present

## 2021-06-09 DIAGNOSIS — R6510 Systemic inflammatory response syndrome (SIRS) of non-infectious origin without acute organ dysfunction: Secondary | ICD-10-CM | POA: Diagnosis not present

## 2021-06-09 LAB — OCCULT BLOOD X 1 CARD TO LAB, STOOL: Fecal Occult Bld: POSITIVE — AB

## 2021-06-09 LAB — COMPREHENSIVE METABOLIC PANEL
ALT: 34 U/L (ref 0–44)
AST: 23 U/L (ref 15–41)
Albumin: 2.2 g/dL — ABNORMAL LOW (ref 3.5–5.0)
Alkaline Phosphatase: 75 U/L (ref 38–126)
Anion gap: 9 (ref 5–15)
BUN: 22 mg/dL (ref 8–23)
CO2: 25 mmol/L (ref 22–32)
Calcium: 8.4 mg/dL — ABNORMAL LOW (ref 8.9–10.3)
Chloride: 107 mmol/L (ref 98–111)
Creatinine, Ser: 0.84 mg/dL (ref 0.61–1.24)
GFR, Estimated: >60 mL/min (ref >60)
Glucose, Bld: 136 mg/dL — ABNORMAL HIGH (ref 70–99)
Potassium: 3.8 mmol/L (ref 3.5–5.1)
Sodium: 141 mmol/L (ref 135–145)
Total Bilirubin: 0.6 mg/dL (ref 0.3–1.2)
Total Protein: 5.7 g/dL — ABNORMAL LOW (ref 6.5–8.1)

## 2021-06-09 LAB — CBC
HCT: 30.9 % — ABNORMAL LOW (ref 39.0–52.0)
Hemoglobin: 9.6 g/dL — ABNORMAL LOW (ref 13.0–17.0)
MCH: 28.2 pg (ref 26.0–34.0)
MCHC: 31.1 g/dL (ref 30.0–36.0)
MCV: 90.6 fL (ref 80.0–100.0)
Platelets: 304 10*3/uL (ref 150–400)
RBC: 3.41 MIL/uL — ABNORMAL LOW (ref 4.22–5.81)
RDW: 14.5 % (ref 11.5–15.5)
WBC: 10.3 10*3/uL (ref 4.0–10.5)
nRBC: 0 % (ref 0.0–0.2)

## 2021-06-09 LAB — GLUCOSE, CAPILLARY
Glucose-Capillary: 127 mg/dL — ABNORMAL HIGH (ref 70–99)
Glucose-Capillary: 125 mg/dL — ABNORMAL HIGH (ref 70–99)
Glucose-Capillary: 123 mg/dL — ABNORMAL HIGH (ref 70–99)
Glucose-Capillary: 141 mg/dL — ABNORMAL HIGH (ref 70–99)

## 2021-06-09 MED ORDER — PANTOPRAZOLE SODIUM 40 MG IV SOLR
40.0000 mg | Freq: Two times a day (BID) | INTRAVENOUS | Status: DC
  Administered 2021-06-09 – 2021-06-10 (×3): 40 mg via INTRAVENOUS
  Filled 2021-06-09 (×3): qty 40

## 2021-06-09 MED ORDER — MIRTAZAPINE 15 MG PO TABS
7.5000 mg | ORAL_TABLET | Freq: Every day | ORAL | Status: DC
  Administered 2021-06-09: 7.5 mg via ORAL
  Filled 2021-06-09: qty 1

## 2021-06-09 NOTE — Anesthesia Preprocedure Evaluation (Deleted)
Anesthesia Evaluation    Reviewed: Allergy & Precautions, Patient's Chart, lab work & pertinent test results  Airway        Dental   Pulmonary sleep apnea , former smoker,           Cardiovascular hypertension, Pt. on medications and Pt. on home beta blockers + CAD  + dysrhythmias   EKG: NSR   Neuro/Psych PSYCHIATRIC DISORDERS    GI/Hepatic negative GI ROS, Neg liver ROS,   Endo/Other  diabetes  Renal/GU negative Renal ROS     Musculoskeletal negative musculoskeletal ROS (+)   Abdominal   Peds  Hematology negative hematology ROS (+)   Anesthesia Other Findings   Reproductive/Obstetrics                             Anesthesia Physical Anesthesia Plan  ASA: 2  Anesthesia Plan: MAC   Post-op Pain Management:    Induction: Intravenous  PONV Risk Score and Plan: 0 and Propofol infusion  Airway Management Planned: Natural Airway and Simple Face Mask  Additional Equipment: None  Intra-op Plan:   Post-operative Plan:   Informed Consent:   Plan Discussed with: CRNA  Anesthesia Plan Comments:         Anesthesia Quick Evaluation

## 2021-06-09 NOTE — Progress Notes (Signed)
PROGRESS NOTE    [name].  LGX:[id number] DOB: 05-Jan-1941 DOA: 06/07/2021 PCP: Celene Squibb, MD.   Brief Narrative:  [name]. is a 80 y.o. male with medical history significant of bladder cancer s/p cystectomy, prostatectomy and ileal conduit surgery on July 22, coronary artery disease, hyperlipidemia, essential hypertension, obstructive sleep apnea, Tourette's syndrome among other things who was brought in today secondary to progressive weakness failure to thrive at home and generalized malaise since his surgery.  Per family he has been declining with poor oral intake.  Patient was noted to be dehydrated.  Received fluid resuscitation in the ER.  Still lethargic and weak.  Urinalysis not impressive for a UTI but he meets SIRS criteria.  ER physician has discussed with urology who recommends treating patient as if he has a UTI postoperatively.  Patient therefore being admitted to the hospital for further evaluation and treatment.   ED Course: Temperature is 98.2 blood pressure 97/85, pulse 114, respiratory of 21 oxygen sat 93% on room air.  White count 15.1 hemoglobin 11.1 platelets 331.  Chemistry showed BUN 36 otherwise mostly within normal.  Lactic acid is 2.5.  Head CT without contrast showed no acute findings CT abdomen pelvis showed interval prostatectomy and cystectomy with right lower quadrant urostomy.  There is mild right and moderate left hydronephrosis with stents in place.  Also infrarenal abdominal aortic aneurysm of 3.7 cm.  Patient being admitted for treatment by urology    Assessment & Plan:   Principal Problem:   SIRS (systemic inflammatory response syndrome) (HCC) Active Problems:   OSA (obstructive sleep apnea)   Bladder cancer (HCC)   Mixed hyperlipidemia   Essential hypertension   Type 2 diabetes mellitus ([place])    #1 sepsis patient met sepsis criteria on admission with hypotension , tachycardia with a heart rate above 100 and tachypnea with  leukocytosis. Patient admitted with generalized weakness decreased p.o. intake unable to function at home or do his ADLs.  He is status post prostatectomy cystectomy and ilial conduit for bladder cancer on May 19, 2021.  ED physician discussed with urology who recommended antibiotics and follow-up urine culture. His white count is coming down/trending down 10.3 from 11.1 from 15.0 on admission.  His lactic acid is 1.4. CT of the abdomen and pelvis shows mild right and moderate left hydronephrosis with stents in place.  Infrarenal abdominal aortic aneurysm 3.7 cm recommending CT q. 2 years. Will check renal ultrasound-moderate left-sided hydronephrosis, no right-sided hydronephrosis. Renal function stable. DC IV fluids Pt consult recommending SNF.  #2 history of essential hypertension patient takes Zestril and Toprol at home we will continue to hold this since his blood pressure is soft.  #3 obstructive sleep apnea continue CPAP  #4 hyperlipidemia on statin continue  #5 type 2 diabetes not on any treatments at home.  His hemoglobin A1c was 6.5 in March 2022.on ssi. CBG (last 3)  Recent Labs    06/08/21 2158 06/09/21 0732 06/09/21 1134  GLUCAP 126* 127* 125*      Estimated body mass index is 25.08 kg/m as calculated from the following:   Height as of this encounter: _0  (1.778 m).   Weight as of this encounter: 79.3 kg.  DVT prophylaxis: lovenox Code Status: full Family Communication: Discussed with son at bedside Disposition Plan:  Status is: Inpatient  Remains inpatient appropriate because:IV treatments appropriate due to intensity of illness or inability to take PO  Dispo: The patient is from: Home  Anticipated d/c is to: SNF              Patient currently is not medically stable to d/c.   Difficult to place patient No    Consultants: Urology  Procedures: None Antimicrobials: Rocephin  Subjective:  He is sitting up in chair son by the bedside he is  awake and alert he answer all my questions appropriately and followed commands  Objective: Vitals:   06/08/21 1407 06/08/21 1745 06/08/21 2051 06/09/21 0430  BP: 128/71 124/68 126/89 125/74  Pulse: 74 76 71 74  Resp: _0 Temp: 98.3 F (36.8 C) 97.8 F (36.6 C) 98 F (36.7 C) 98.4 F (36.9 C)  TempSrc:  Oral Oral Oral  SpO2: 90% 98% 98% 98%  Weight:    79.3 kg  Height:        Intake/Output Summary (Last 24 hours) at 06/09/2021 1321 Last data filed at 06/09/2021 1000 Gross per 24 hour  Intake 2765.56 ml  Output 1200 ml  Net 1565.56 ml    Filed Weights   06/07/21 1737 06/09/21 0430  Weight: 88 kg 79.3 kg    Examination:  General exam: Appears calm and comfortable  Respiratory system: Clear to auscultation. Respiratory effort normal. Cardiovascular system: S1 & S2 heard, RRR. No JVD, murmurs, rubs, gallops or clicks. No pedal edema. Gastrointestinal system: Abdomen is nondistended, soft and nontender. No organomegaly or masses felt. Normal bowel sounds heard.  Ileal conduit in place connected to Foley catheter Central nervous system: Alert and oriented. No focal neurological deficits. Extremities: Symmetric 5 x 5 power. Skin: No rashes, lesions or ulcers Psychiatry: Judgement and insight appear normal. Mood & affect appropriate.     Data Reviewed: I have personally reviewed following labs and imaging studies  CBC: Recent Labs  Lab 06/07/21 1752 06/08/21 0426 06/09/21 0436  WBC 15.0* 11.1* 10.3  NEUTROABS 11.9*  --   --   HGB 11.6* 9.7* 9.6*  HCT 37.2* 30.0* 30.9*  MCV 90.7 88.8 90.6  PLT 331 260 656    Basic Metabolic Panel: Recent Labs  Lab 06/07/21 1752 06/07/21 1821 06/08/21 0426 06/09/21 0436  NA 137  --  136 141  K 4.5  --  3.8 3.8  CL 103  --  104 107  CO2 23  --  25 25  GLUCOSE 184*  --  149* 136*  BUN 36*  --  28* 22  CREATININE 1.11  --  0.84 0.84  CALCIUM 9.6  --  8.5* 8.4*  MG  --  1.7  --   --     GFR: Estimated Creatinine  Clearance: 70 mL/min (by C-G formula based on SCr of 0.84 mg/dL). Liver Function Tests: Recent Labs  Lab 06/07/21 1752 06/08/21 0426 06/09/21 0436  AST _1 ALT 44 35 34  ALKPHOS 99 78 75  BILITOT 0.7 0.2* 0.6  PROT 6.8 5.7* 5.7*  ALBUMIN 2.7* 2.1* 2.2*    No results for input(s): LIPASE, AMYLASE in the last 168 hours. Recent Labs  Lab 06/07/21 1822  AMMONIA 14    Coagulation Profile: Recent Labs  Lab 06/07/21 1752  INR 1.1    Cardiac Enzymes: No results for input(s): CKTOTAL, CKMB, CKMBINDEX, TROPONINI in the last 168 hours. BNP (last 3 results) No results for input(s): PROBNP in the last 8760 hours. HbA1C: Recent Labs    06/08/21 0426  HGBA1C 6.7*   CBG: Recent Labs  Lab 06/08/21 1126 06/08/21 1641 06/08/21 2158  06/09/21 0732 06/09/21 1134  GLUCAP 123* 175* 126* 127* 125*    Lipid Profile: No results for input(s): CHOL, HDL, LDLCALC, TRIG, CHOLHDL, LDLDIRECT in the last 72 hours. Thyroid Function Tests: Recent Labs    06/07/21 1822  TSH 2.768    Anemia Panel: No results for input(s): VITAMINB12, FOLATE, FERRITIN, TIBC, IRON, RETICCTPCT in the last 72 hours. Sepsis Labs: Recent Labs  Lab 06/07/21 1751 06/07/21 2020  LATICACIDVEN 2.5* 1.4     Recent Results (from the past 240 hour(s))  Urine Culture     Status: Abnormal (Preliminary result)   Collection Time: 06/07/21  5:35 PM   Specimen: In/Out Cath Urine  Result Value Ref Range Status   Specimen Description   Final    IN/OUT CATH URINE Performed at [facility], [place] [address], [place], [place] [address]    Special Requests   Final    NONE Performed at [facility], [place] [address], [place], [place] [address]    Culture (A)  Final    >=100,000 COLONIES/mL GRAM NEGATIVE RODS SUSCEPTIBILITIES TO FOLLOW Performed at [facility], [place] [address], [place], [place] [address]    Report Status PENDING  Incomplete  Blood Culture (routine  x 2)     Status: None (Preliminary result)   Collection Time: 06/07/21  5:40 PM   Specimen: BLOOD RIGHT FOREARM  Result Value Ref Range Status   Specimen Description BLOOD RIGHT FOREARM  Final   Special Requests   Final    BOTTLES DRAWN AEROBIC AND ANAEROBIC Blood Culture results may not be optimal due to an excessive volume of blood received in culture bottles   Culture   Final    NO GROWTH < 12 HOURS Performed at [facility], [place] [address], [place], [place] [address]    Report Status PENDING  Incomplete  Blood Culture (routine x 2)     Status: None (Preliminary result)   Collection Time: 06/07/21  5:55 PM   Specimen: BLOOD  Result Value Ref Range Status   Specimen Description   Final    BLOOD LEFT ANTECUBITAL Performed at [place] [address], [place], [place] [address]    Special Requests   Final    BOTTLES DRAWN AEROBIC AND ANAEROBIC Blood Culture adequate volume   Culture   Final    NO GROWTH < 12 HOURS Performed at [facility], [place] [address], [place], [place] [address]    Report Status PENDING  Incomplete  Resp Panel by RT-PCR (Flu A&B, Covid) Nasopharyngeal Swab     Status: None   Collection Time: 06/07/21  6:30 PM   Specimen: Nasopharyngeal Swab; Nasopharyngeal(NP) swabs in vial transport medium  Result Value Ref Range Status   SARS Coronavirus 2 by RT PCR NEGATIVE NEGATIVE Final    Comment: (NOTE) SARS-CoV-2 target nucleic acids are NOT DETECTED.  The SARS-CoV-2 RNA is generally detectable in upper respiratory specimens during the acute phase of infection. The lowest concentration of SARS-CoV-2 viral copies this assay can detect is 138 copies/mL. A negative result does not preclude SARS-Cov-2 infection and should not be used as the sole basis for treatment or other patient management decisions. A negative result may occur with  improper specimen collection/handling, submission of specimen other than nasopharyngeal swab,  presence of viral mutation(s) within the areas targeted by this assay, and inadequate number of viral copies(<138 copies/mL). A negative result must be combined with clinical observations, patient  history, and epidemiological information. The expected result is Negative.  Fact Sheet for Patients:  EntrepreneurPulse.com.au  Fact Sheet for Healthcare Providers:  IncredibleEmployment.be  This test is no t yet approved or cleared by the [place] FDA and  has been authorized for detection and/or diagnosis of SARS-CoV-2 by FDA under an Emergency Use Authorization (EUA). This EUA will remain  in effect (meaning this test can be used) for the duration of the COVID-19 declaration under Section 564(b)(1) of the Act, 21 U.S.C.section 360bbb-3(b)(1), unless the authorization is terminated  or revoked sooner.       Influenza A by PCR NEGATIVE NEGATIVE Final   Influenza B by PCR NEGATIVE NEGATIVE Final    Comment: (NOTE) The Xpert Xpress SARS-CoV-2/FLU/RSV plus assay is intended as an aid in the diagnosis of influenza from Nasopharyngeal swab specimens and should not be used as a sole basis for treatment. Nasal washings and aspirates are unacceptable for Xpert Xpress SARS-CoV-2/FLU/RSV testing.  Fact Sheet for Patients: EntrepreneurPulse.com.au  Fact Sheet for Healthcare Providers: IncredibleEmployment.be  This test is not yet approved or cleared by the [place] FDA and has been authorized for detection and/or diagnosis of SARS-CoV-2 by FDA under an Emergency Use Authorization (EUA). This EUA will remain in effect (meaning this test can be used) for the duration of the COVID-19 declaration under Section 564(b)(1) of the Act, 21 U.S.C. section 360bbb-3(b)(1), unless the authorization is terminated or revoked.  Performed at [facility], [place] [address] [place], [place] [address]            Radiology Studies: CT HEAD WO CONTRAST (5MM)  Result Date: 06/07/2021 CLINICAL DATA:  Mental status change, unknown cause EXAM: CT HEAD WITHOUT CONTRAST TECHNIQUE: Contiguous axial images were obtained from the base of the skull through the vertex without intravenous contrast. COMPARISON:  None. FINDINGS: Brain: No acute intracranial abnormality. Specifically, no hemorrhage, hydrocephalus, mass lesion, acute infarction, or significant intracranial injury. Mild age related volume loss. Vascular: No hyperdense vessel or unexpected calcification. Skull: No acute calvarial abnormality. Sinuses/Orbits: No acute findings Other: None IMPRESSION: No acute intracranial abnormality. Electronically Signed   By: Rolm Baptise M.D.   On: 06/07/2021 19:33   CT ABDOMEN PELVIS W CONTRAST  Result Date: 06/07/2021 CLINICAL DATA:  Recent bladder surgery weakness EXAM: CT ABDOMEN AND PELVIS WITH CONTRAST TECHNIQUE: Multidetector CT imaging of the abdomen and pelvis was performed using the standard protocol following bolus administration of intravenous contrast. CONTRAST:  75m OMNIPAQUE IOHEXOL 350 MG/ML SOLN COMPARISON:  CT 02/17/2021, 01/03/2021 FINDINGS: Lower chest: Lung bases demonstrate no acute consolidation or effusion. Normal cardiac size. Hepatobiliary: No focal liver abnormality is seen. No gallstones, gallbladder wall thickening, or biliary dilatation. Pancreas: Unremarkable. No pancreatic ductal dilatation or surrounding inflammatory changes. Spleen: Normal in size without focal abnormality. Adrenals/Urinary Tract: Adrenal glands are normal. Lower pole right renal sinus cyst. Multiple bilateral kidney stones. Interval cystectomy and right lower quadrant urostomy. Placement of bilateral ureteral stents. Interim development of mild right and moderate left hydronephrosis. Stomach/Bowel: The stomach is nonenlarged. No dilated small bowel. No acute bowel wall thickening. Vascular/Lymphatic: Advanced aortic  atherosclerosis. 3.7 cm infrarenal abdominal aortic aneurysm. No suspicious nodes Reproductive: Status post prostatectomy Other: Small pelvic effusion. No free air. Moderate gas in the subcutaneous soft tissues Musculoskeletal: No acute osseous abnormality. Degenerative changes of the spine. IMPRESSION: 1. Interval prostatectomy and cystectomy and right lower quadrant urostomy. Interim development of mild right and moderate left hydronephrosis with ureteral stents in place. Correlate  for stent function. 2. Small fluid in the pelvis. Moderate gas in the subcutaneous soft tissues of the abdominal wall presumably due to postsurgical gas. 3. Infrarenal abdominal aortic aneurysm up to 3.7 cm. Recommend follow-up ultrasound every 2 years. This recommendation follows ACR consensus guidelines: White Paper of the ACR Incidental Findings Committee II on Vascular Findings. J Am Coll Radiol 2013; 10:789-794. Electronically Signed   By: Donavan Foil M.D.   On: 06/07/2021 19:48   US RENAL  Result Date: 06/08/2021 CLINICAL DATA:  Elevated serum creatinine EXAM: RENAL / URINARY TRACT ULTRASOUND COMPLETE COMPARISON:  None. FINDINGS: Right Kidney: Renal measurements: 12.1 x 6.1 x 5.1 cm = volume: 197 mL. No hydronephrosis. There is a 5.4 x 3.9 x 3.1 cm cystic lesion in the lower pole of the right kidney. This appears simple. No nephrolithiasis visualized. Left Kidney: Renal measurements: 13.0 x 5.7 x 5.6 cm = volume: 215 mL. Moderate hydronephrosis. No cystic or solid lesion. No nephrolithiasis visualized. Bladder: Prior cystectomy Other: None. IMPRESSION: Moderate left-sided hydronephrosis. No right-sided hydronephrosis. 5.4 cm simple appearing right lower pole renal cyst. Electronically Signed   By: Maurine Simmering M.D.   On: 06/08/2021 13:49   DG Chest Port 1 View  Result Date: 06/07/2021 CLINICAL DATA:  Chest pain, cough. EXAM: PORTABLE CHEST 1 VIEW COMPARISON:  None. FINDINGS: The heart size and mediastinal contours are  within normal limits. Both lungs are clear. The visualized skeletal structures are unremarkable. IMPRESSION: No active disease. Aortic Atherosclerosis (ICD10-I70.0). Electronically Signed   By: Marijo Conception M.D.   On: 06/07/2021 18:36        Scheduled Meds:  enoxaparin (LOVENOX) injection  40 mg Subcutaneous Q24H   insulin aspart  0-15 Units Subcutaneous TID WC   insulin aspart  0-5 Units Subcutaneous QHS   LORazepam  0.5 mg Oral QHS   mirtazapine  7.5 mg Oral QHS   multivitamin with minerals  1 tablet Oral Daily   pantoprazole (PROTONIX) IV  40 mg Intravenous Q12H   Continuous Infusions:  sodium chloride 100 mL/hr at 06/09/21 0319   cefTRIAXone (ROCEPHIN)  IV Stopped (06/08/21 2359)     LOS: 2 days    Time spent: 67 min    Georgette Shell, MD  06/09/2021, 1:21 PM

## 2021-06-09 NOTE — Evaluation (Addendum)
Physical Therapy Evaluation Patient Details Name: [name]. MRN: [id number] DOB: [birth year] Today's Date: 06/09/2021   History of Present Illness  80 y.o. male admitted 06/08/21 with progressive weakness failure to thrive at home and generalized malaise since his surgery.  Per family he has been declining with poor oral intake.  Patient was noted to be dehydrated. Dx of SIRS. Pt  with medical history significant of bladder cancer s/p cystectomy, prostatectomy and ileal conduit surgery on May 19, 2021, coronary artery disease, hyperlipidemia, essential hypertension, obstructive sleep apnea, Tourette's syndrome.  Clinical Impression  Pt admitted with above diagnosis. Mod assist for supine to sit. Pt sat at edge of bed for ~3 minutes, then reported nausea and stated he needed to return to bed. Instructed pt in bed level strengthening exercises. Pt's 2 sons present for PT session and are agreeable to ST-SNF recommendation. At baseline, pt was independent with ambulation. His sons report he's declined since surgery on 05/19/21 and was not able to ambulate just prior to this admission.  Pt currently with functional limitations due to the deficits listed below (see PT Problem List). Pt will benefit from skilled PT to increase their independence and safety with mobility to allow discharge to the venue listed below.       Follow Up Recommendations SNF;Supervision/Assistance - 24 hour;Supervision for mobility/OOB    Equipment Recommendations  None recommended by PT    Recommendations for Other Services       Precautions / Restrictions Precautions Precautions: Fall Precaution Comments: cystectomy/ileal conduit 05/19/21 Restrictions Weight Bearing Restrictions: No      Mobility  Bed Mobility Overal bed mobility: Needs Assistance Bed Mobility: Supine to Sit     Supine to sit: Mod assist;HOB elevated Sit to supine: Min assist   General bed mobility comments: mod assist to raise  trunk    Transfers                 General transfer comment: NT- pt nauseous in sitting so assisted back to bed; nurse tech reported pt transferred recliner to bed with +2 assist and use of stedy just prior to PT session.  Ambulation/Gait                Stairs            Wheelchair Mobility    Modified Rankin (Stroke Patients Only)       Balance Overall balance assessment: Needs assistance Sitting-balance support: Feet supported;No upper extremity supported Sitting balance-Leahy Scale: Fair                                       Pertinent Vitals/Pain Pain Assessment: No/denies pain    Home Living Family/patient expects to be discharged to:: Private residence Living Arrangements: Spouse/significant other Available Help at Discharge: Family Type of Home: House Home Access: Stairs to enter Entrance Stairs-Rails: Left Entrance Stairs-Number of Steps: 2 Home Layout: Two level;Able to live on main level with bedroom/bathroom Home Equipment: Grab bars - tub/shower;Walker - 2 wheels;Cane - single point;Cane - quad Additional Comments: Pt lives with wife (who has h/o stroke and cannot provide much physical assist) and has 2 sons close by.    Prior Function Level of Independence: Needs assistance   Gait / Transfers Assistance Needed: for the past ~3 weeks, since bladder surgery 05/19/21 pt has been weak and declining, he ambulated 300' with RW on 05/24/21 with  PT but was needing significant assist for mobility and couldn' really walk just prior to this admission; prior to 05/19/21 he was independent  ADL's / Homemaking Assistance Needed: assist needed  Comments: denies falls.     Hand Dominance   Dominant Hand: Right    Extremity/Trunk Assessment   Upper Extremity Assessment Upper Extremity Assessment: Overall WFL for tasks assessed    Lower Extremity Assessment Lower Extremity Assessment: Overall WFL for tasks assessed (SLR at least  3/5 B)    Cervical / Trunk Assessment Cervical / Trunk Assessment: Normal  Communication   Communication: No difficulties (history of tourettes with odd breathiness noted at times)  Cognition Arousal/Alertness: Awake/alert Behavior During Therapy: WFL for tasks assessed/performed Overall Cognitive Status: Impaired/Different from baseline Area of Impairment: Following commands;Problem solving                       Following Commands: Follows one step commands with increased time       General Comments: focused on wanting to get a POA signed at the bank, difficult to keep his focus, follows commands with increased time and repeated commands, distracted      General Comments      Exercises General Exercises - Lower Extremity Ankle Circles/Pumps: AROM;Both;10 reps;Supine Quad Sets: AROM;Both;5 reps;Supine Straight Leg Raises: AROM;Both;5 reps;Supine   Assessment/Plan    PT Assessment Patient needs continued PT services  PT Problem List Decreased activity tolerance;Decreased balance;Decreased mobility;Decreased knowledge of use of DME       PT Treatment Interventions Gait training;Stair training;Functional mobility training;Therapeutic activities;Therapeutic exercise;Patient/family education;DME instruction    PT Goals (Current goals can be found in the Care Plan section)  Acute Rehab PT Goals Patient Stated Goal: to be able to go home PT Goal Formulation: With patient/family Time For Goal Achievement: 06/23/21 Potential to Achieve Goals: Good    Frequency Min 2X/week   Barriers to discharge        Co-evaluation               AM-PAC PT "6 Clicks" Mobility  Outcome Measure Help needed turning from your back to your side while in a flat bed without using bedrails?: A Little Help needed moving from lying on your back to sitting on the side of a flat bed without using bedrails?: A Lot Help needed moving to and from a bed to a chair (including a  wheelchair)?: Total Help needed standing up from a chair using your arms (e.g., wheelchair or bedside chair)?: Total Help needed to walk in hospital room?: Total Help needed climbing 3-5 steps with a railing? : Total 6 Click Score: 9    End of Session Equipment Utilized During Treatment: Gait belt Activity Tolerance: Patient tolerated treatment well Patient left: in bed;with family/visitor present;with call bell/phone within reach;with bed alarm set Nurse Communication: Mobility status PT Visit Diagnosis: Difficulty in walking, not elsewhere classified (R26.2);Adult, failure to thrive (R62.7);Other abnormalities of gait and mobility (R26.89)    Time: [id number] PT Time Calculation (min) (ACUTE ONLY): 20 min   Charges:   PT Evaluation $PT Eval Moderate Complexity: 1 Mod         Philomena Doheny PT 06/09/2021  Acute Rehabilitation Services Pager [phone] Office [phone]

## 2021-06-09 NOTE — Consult Note (Signed)
UNASSIGNED [facility] CONSULT  Reason for Consult: Melena, progressive anemia, and heme positive stool Referring Physician: [place]. HPI: This is an 80 year old male with a PMH of CAD, RBBB, first degree AV block, bladder cancer s/p cystectomy with ileal conduit 05/19/2021, HTN, OSA and Tourette's syndrome admitted for failure to thrive and fatigue.  His symptoms started after his surgery and he reports poor oral intake.  Blood work revealed that his HGB dropped from his baseline of 14 g/dL down to his current 9.6 g/dL.  Today he was noted to have melenic stools and it was confirmed to be heme positive.  The patient does not recall having a prior colonoscopy in the past.  He reports having abdominal pain from the recent surgery.  There were no reports of any NSAID use.  Past Medical History:  Diagnosis Date   Anginal pain ([place]) 1997   Coronary artery disease    First degree AV block 01/16/2021   Noted on EKG   History of kidney stones    Hypercholesteremia    Hypertension    Neuromuscular disorder ([place])    peripheral neuropathy   OSA (obstructive sleep apnea) 08/05/2013   Pre-diabetes    Right bundle branch block (RBBB) 01/16/2021   Noted on EKG   Sleep apnea    Tourette syndrome    Tourette's syndrome 04/01/2013    Past Surgical History:  Procedure Laterality Date   [place]   CATARACT EXTRACTION  11/2012   CYSTOSCOPY WITH INJECTION N/A 05/19/2021   Procedure: CYSTOSCOPY WITH INJECTION OF INDOCYANINE GREEN DYE;  Surgeon: Alexis Frock, MD;  Location: [facility];  Service: Urology;  Laterality: N/A;   heart stent  01/16/1996   LYMPH NODE DISSECTION Bilateral 05/19/2021   Procedure: LYMPH NODE DISSECTION;  Surgeon: Alexis Frock, MD;  Location[facility];  Service: Urology;  Laterality: Bilateral;   ROBOT ASSISTED LAPAROSCOPIC COMPLETE CYSTECT ILEAL CONDUIT N/A 05/19/2021   Procedure: XI ROBOTIC ASSISTED LAPAROSCOPIC COMPLETE  CYSTECT ILEAL CONDUIT;  Surgeon: Alexis Frock, MD;  Location[facility];  Service: Urology;  Laterality: N/A;   ROBOT ASSISTED LAPAROSCOPIC RADICAL PROSTATECTOMY N/A 05/19/2021   Procedure: XI ROBOTIC ASSISTED LAPAROSCOPIC RADICAL PROSTATECTOMY;  Surgeon: Alexis Frock, MD;  [facility];  Service: Urology;  Laterality: N/A;   TRANSURETHRAL RESECTION OF BLADDER TUMOR Bilateral 01/17/2021   Procedure: TRANSURETHRAL RESECTION OF BLADDER TUMOR (TURBT);  Surgeon: Irine Seal, MD;  Location[facility];  Service: Urology;  Laterality: Bilateral;  GENERAL ANESTHESIA WITH  PARLYSIS   TRANSURETHRAL RESECTION OF BLADDER TUMOR Left 02/03/2021   Procedure: TRANSURETHRAL RESECTION OF BLADDER TUMOR (TURBT);  Surgeon: Irine Seal, MD;  Location[facility];  Service: Urology;  Laterality: Left;  WITH PARALYPIC    Family History  Problem Relation Age of Onset   Pancreatic cancer Mother    Heart disease Father     Social History:  reports that he quit smoking about 25 years ago. His smoking use included cigarettes. He has never used smokeless tobacco. He reports that he does not drink alcohol and does not use drugs.  Allergies: No Known Allergies  Medications: Scheduled:  enoxaparin (LOVENOX) injection  40 mg Subcutaneous Q24H   insulin aspart  0-15 Units Subcutaneous TID WC   insulin aspart  0-5 Units Subcutaneous QHS   LORazepam  0.5 mg Oral QHS   mirtazapine  7.5 mg Oral QHS   multivitamin with minerals  1 tablet  Oral Daily   pantoprazole (PROTONIX) IV  40 mg Intravenous Q12H   Continuous:  sodium chloride 100 mL/hr at 06/09/21 0319   cefTRIAXone (ROCEPHIN)  IV Stopped (06/08/21 2359)    Results for orders placed or performed during the hospital encounter of 06/07/21 (from the past 24 hour(s))  Glucose, capillary     Status: Abnormal   Collection Time: 06/08/21  4:41 PM  Result Value Ref Range   Glucose-Capillary 175 (H) 70 - 99 mg/dL  Glucose, capillary     Status: Abnormal   Collection Time:  06/08/21  9:58 PM  Result Value Ref Range   Glucose-Capillary 126 (H) 70 - 99 mg/dL  CBC     Status: Abnormal   Collection Time: 06/09/21  4:36 AM  Result Value Ref Range   WBC 10.3 4.0 - 10.5 K/uL   RBC 3.41 (L) 4.22 - 5.81 MIL/uL   Hemoglobin 9.6 (L) 13.0 - 17.0 g/dL   HCT 30.9 (L) 39.0 - 52.0 %   MCV 90.6 80.0 - 100.0 fL   MCH 28.2 26.0 - 34.0 pg   MCHC 31.1 30.0 - 36.0 g/dL   RDW 14.5 11.5 - 15.5 %   Platelets 304 150 - 400 K/uL   nRBC 0.0 0.0 - 0.2 %  Comprehensive metabolic panel     Status: Abnormal   Collection Time: 06/09/21  4:36 AM  Result Value Ref Range   Sodium 141 135 - 145 mmol/L   Potassium 3.8 3.5 - 5.1 mmol/L   Chloride 107 98 - 111 mmol/L   CO2 25 22 - 32 mmol/L   Glucose, Bld 136 (H) 70 - 99 mg/dL   BUN 22 8 - 23 mg/dL   Creatinine, Ser 0.84 0.61 - 1.24 mg/dL   Calcium 8.4 (L) 8.9 - 10.3 mg/dL   Total Protein 5.7 (L) 6.5 - 8.1 g/dL   Albumin 2.2 (L) 3.5 - 5.0 g/dL   AST 23 15 - 41 U/L   ALT 34 0 - 44 U/L   Alkaline Phosphatase 75 38 - 126 U/L   Total Bilirubin 0.6 0.3 - 1.2 mg/dL   GFR, Estimated >60 >60 mL/min   Anion gap 9 5 - 15  Glucose, capillary     Status: Abnormal   Collection Time: 06/09/21  7:32 AM  Result Value Ref Range   Glucose-Capillary 127 (H) 70 - 99 mg/dL  Occult blood card to lab, stool RN will collect     Status: Abnormal   Collection Time: 06/09/21  9:32 AM  Result Value Ref Range   Fecal Occult Bld POSITIVE (A) NEGATIVE  Glucose, capillary     Status: Abnormal   Collection Time: 06/09/21 11:34 AM  Result Value Ref Range   Glucose-Capillary 125 (H) 70 - 99 mg/dL     CT HEAD WO CONTRAST (5MM)  Result Date: 06/07/2021 CLINICAL DATA:  Mental status change, unknown cause EXAM: CT HEAD WITHOUT CONTRAST TECHNIQUE: Contiguous axial images were obtained from the base of the skull through the vertex without intravenous contrast. COMPARISON:  None. FINDINGS: Brain: No acute intracranial abnormality. Specifically, no hemorrhage,  hydrocephalus, mass lesion, acute infarction, or significant intracranial injury. Mild age related volume loss. Vascular: No hyperdense vessel or unexpected calcification. Skull: No acute calvarial abnormality. Sinuses/Orbits: No acute findings Other: None IMPRESSION: No acute intracranial abnormality. Electronically Signed   By: Rolm Baptise M.D.   On: 06/07/2021 19:33   CT ABDOMEN PELVIS W CONTRAST  Result Date: 06/07/2021 CLINICAL DATA:  Recent bladder  surgery weakness EXAM: CT ABDOMEN AND PELVIS WITH CONTRAST TECHNIQUE: Multidetector CT imaging of the abdomen and pelvis was performed using the standard protocol following bolus administration of intravenous contrast. CONTRAST:  22m OMNIPAQUE IOHEXOL 350 MG/ML SOLN COMPARISON:  CT 02/17/2021, 01/03/2021 FINDINGS: Lower chest: Lung bases demonstrate no acute consolidation or effusion. Normal cardiac size. Hepatobiliary: No focal liver abnormality is seen. No gallstones, gallbladder wall thickening, or biliary dilatation. Pancreas: Unremarkable. No pancreatic ductal dilatation or surrounding inflammatory changes. Spleen: Normal in size without focal abnormality. Adrenals/Urinary Tract: Adrenal glands are normal. Lower pole right renal sinus cyst. Multiple bilateral kidney stones. Interval cystectomy and right lower quadrant urostomy. Placement of bilateral ureteral stents. Interim development of mild right and moderate left hydronephrosis. Stomach/Bowel: The stomach is nonenlarged. No dilated small bowel. No acute bowel wall thickening. Vascular/Lymphatic: Advanced aortic atherosclerosis. 3.7 cm infrarenal abdominal aortic aneurysm. No suspicious nodes Reproductive: Status post prostatectomy Other: Small pelvic effusion. No free air. Moderate gas in the subcutaneous soft tissues Musculoskeletal: No acute osseous abnormality. Degenerative changes of the spine. IMPRESSION: 1. Interval prostatectomy and cystectomy and right lower quadrant urostomy. Interim  development of mild right and moderate left hydronephrosis with ureteral stents in place. Correlate for stent function. 2. Small fluid in the pelvis. Moderate gas in the subcutaneous soft tissues of the abdominal wall presumably due to postsurgical gas. 3. Infrarenal abdominal aortic aneurysm up to 3.7 cm. Recommend follow-up ultrasound every 2 years. This recommendation follows ACR consensus guidelines: White Paper of the ACR Incidental Findings Committee II on Vascular Findings. J Am Coll Radiol 2013; 10:789-794. Electronically Signed   By: KDonavan FoilM.D.   On: 06/07/2021 19:48   [place]NAL  Result Date: 06/08/2021 CLINICAL DATA:  Elevated serum creatinine EXAM: RENAL / URINARY TRACT ULTRASOUND COMPLETE COMPARISON:  None. FINDINGS: Right Kidney: Renal measurements: 12.1 x 6.1 x 5.1 cm = volume: 197 mL. No hydronephrosis. There is a 5.4 x 3.9 x 3.1 cm cystic lesion in the lower pole of the right kidney. This appears simple. No nephrolithiasis visualized. Left Kidney: Renal measurements: 13.0 x 5.7 x 5.6 cm = volume: 215 mL. Moderate hydronephrosis. No cystic or solid lesion. No nephrolithiasis visualized. Bladder: Prior cystectomy Other: None. IMPRESSION: Moderate left-sided hydronephrosis. No right-sided hydronephrosis. 5.4 cm simple appearing right lower pole renal cyst. Electronically Signed   By: JMaurine SimmeringM.D.   On: 06/08/2021 13:49   DG Chest Port 1 View  Result Date: 06/07/2021 CLINICAL DATA:  Chest pain, cough. EXAM: PORTABLE CHEST 1 VIEW COMPARISON:  None. FINDINGS: The heart size and mediastinal contours are within normal limits. Both lungs are clear. The visualized skeletal structures are unremarkable. IMPRESSION: No active disease. Aortic Atherosclerosis (ICD10-I70.0). Electronically Signed   By: JMarijo ConceptionM.D.   On: 06/07/2021 18:36    ROS:  As stated above in the HPI otherwise negative.  Blood pressure 123/70, pulse 82, temperature 98.4 F (36.9 C), temperature source Oral,  resp. rate 17, height '5\' 10"'$  (1.778 m), weight 79.3 kg, SpO2 100 %.    PE: Gen: NAD, Alert and Oriented HEENT:  [place]/AT, EOMI Neck: Supple, no LAD Lungs: CTA Bilaterally CV: RRR without M/G/R ABD: Soft, tender at the incision site, ileostomy, +BS Ext: No C/C/E  Assessment/Plan: 1) Melena. 2) Progressive anemia. 3) Fatigue.   With his progressive anemia and the findings of heme positive stool in the setting of melena, an EGD will be performed.  Plan: 1) EGD tomorrow with [facility].  Chalmers Iddings D  06/09/2021, 1:38 PM

## 2021-06-09 NOTE — Progress Notes (Signed)
Chaplain responded to page from pt's nurse re. Delivering and explaining AD to pt and his son.  Chaplain introduced self to pt and son [name] who was present at bed.  Pt had some difficulty staying focused on explanation of AD.  Son [name] explained to his father he would go over it so he understood.  Chaplain advised son to have Chaplain paged when ready to have document notarized.    [place]

## 2021-06-09 NOTE — Progress Notes (Addendum)
Son [name] [phone] would like Primary Doctor to call and give updates: concern if patient needs more care or intervention with decrease in appetite.

## 2021-06-09 NOTE — Plan of Care (Signed)

## 2021-06-10 ENCOUNTER — Inpatient Hospital Stay (HOSPITAL_COMMUNITY): Payer: Medicare HMO

## 2021-06-10 ENCOUNTER — Encounter (HOSPITAL_COMMUNITY)
Admission: EM | Disposition: A | Discharge status: Home Health | Payer: Self-pay | Source: Home / Self Care | Attending: Internal Medicine

## 2021-06-10 ENCOUNTER — Encounter (HOSPITAL_COMMUNITY): Payer: Self-pay | Admitting: Internal Medicine

## 2021-06-10 ENCOUNTER — Inpatient Hospital Stay (HOSPITAL_COMMUNITY): Payer: Medicare HMO | Admitting: Certified Registered Nurse Anesthetist

## 2021-06-10 DIAGNOSIS — N390 Urinary tract infection, site not specified: Secondary | ICD-10-CM | POA: Diagnosis not present

## 2021-06-10 DIAGNOSIS — K264 Chronic or unspecified duodenal ulcer with hemorrhage: Secondary | ICD-10-CM

## 2021-06-10 DIAGNOSIS — K260 Acute duodenal ulcer with hemorrhage: Secondary | ICD-10-CM | POA: Diagnosis present

## 2021-06-10 DIAGNOSIS — R578 Other shock: Secondary | ICD-10-CM | POA: Diagnosis present

## 2021-06-10 DIAGNOSIS — K921 Melena: Secondary | ICD-10-CM

## 2021-06-10 DIAGNOSIS — J9601 Acute respiratory failure with hypoxia: Secondary | ICD-10-CM | POA: Diagnosis not present

## 2021-06-10 DIAGNOSIS — K269 Duodenal ulcer, unspecified as acute or chronic, without hemorrhage or perforation: Secondary | ICD-10-CM | POA: Diagnosis present

## 2021-06-10 DIAGNOSIS — R579 Shock, unspecified: Secondary | ICD-10-CM | POA: Diagnosis not present

## 2021-06-10 DIAGNOSIS — K922 Gastrointestinal hemorrhage, unspecified: Secondary | ICD-10-CM

## 2021-06-10 HISTORY — PX: ESOPHAGOGASTRODUODENOSCOPY (EGD) WITH PROPOFOL: SHX5813

## 2021-06-10 HISTORY — PX: HOT HEMOSTASIS: SHX5433

## 2021-06-10 HISTORY — PX: BIOPSY: SHX5522

## 2021-06-10 LAB — GLUCOSE, CAPILLARY
Glucose-Capillary: 127 mg/dL — ABNORMAL HIGH (ref 70–99)
Glucose-Capillary: 131 mg/dL — ABNORMAL HIGH (ref 70–99)
Glucose-Capillary: 151 mg/dL — ABNORMAL HIGH (ref 70–99)
Glucose-Capillary: 151 mg/dL — ABNORMAL HIGH (ref 70–99)
Glucose-Capillary: 172 mg/dL — ABNORMAL HIGH (ref 70–99)
Glucose-Capillary: 88 mg/dL (ref 70–99)

## 2021-06-10 LAB — COMPREHENSIVE METABOLIC PANEL
ALT: 22 U/L (ref 0–44)
AST: 27 U/L (ref 15–41)
Albumin: 1.4 g/dL — ABNORMAL LOW (ref 3.5–5.0)
Alkaline Phosphatase: 43 U/L (ref 38–126)
Anion gap: 16 — ABNORMAL HIGH (ref 5–15)
BUN: 36 mg/dL — ABNORMAL HIGH (ref 8–23)
CO2: 12 mmol/L — ABNORMAL LOW (ref 22–32)
Calcium: 6.9 mg/dL — ABNORMAL LOW (ref 8.9–10.3)
Chloride: 113 mmol/L — ABNORMAL HIGH (ref 98–111)
Creatinine, Ser: 1.21 mg/dL (ref 0.61–1.24)
GFR, Estimated: 60 mL/min — ABNORMAL LOW (ref >60)
Glucose, Bld: 222 mg/dL — ABNORMAL HIGH (ref 70–99)
Potassium: 4.3 mmol/L (ref 3.5–5.1)
Sodium: 141 mmol/L (ref 135–145)
Total Bilirubin: 0.4 mg/dL (ref 0.3–1.2)
Total Protein: 3.4 g/dL — ABNORMAL LOW (ref 6.5–8.1)

## 2021-06-10 LAB — PROTIME-INR
INR: 1.5 — ABNORMAL HIGH (ref 0.8–1.2)
Prothrombin Time: 17.8 seconds — ABNORMAL HIGH (ref 11.4–15.2)

## 2021-06-10 LAB — CBC
HCT: 14.4 % — ABNORMAL LOW (ref 39.0–52.0)
Hemoglobin: 4 g/dL — CL (ref 13.0–17.0)
MCH: 28.2 pg (ref 26.0–34.0)
MCHC: 27.8 g/dL — ABNORMAL LOW (ref 30.0–36.0)
MCV: 101.4 fL — ABNORMAL HIGH (ref 80.0–100.0)
Platelets: 233 10*3/uL (ref 150–400)
RBC: 1.42 MIL/uL — ABNORMAL LOW (ref 4.22–5.81)
RDW: 14.5 % (ref 11.5–15.5)
WBC: 12.3 10*3/uL — ABNORMAL HIGH (ref 4.0–10.5)
nRBC: 0.5 % — ABNORMAL HIGH (ref 0.0–0.2)

## 2021-06-10 LAB — BRAIN NATRIURETIC PEPTIDE: B Natriuretic Peptide: 102.9 pg/mL — ABNORMAL HIGH (ref 0.0–100.0)

## 2021-06-10 LAB — BLOOD GAS, ARTERIAL
Acid-base deficit: 14.1 mmol/L — ABNORMAL HIGH (ref 0.0–2.0)
Bicarbonate: 10.8 mmol/L — ABNORMAL LOW (ref 20.0–28.0)
Drawn by: 29503
FIO2: 100
MECHVT: 580 mL
O2 Saturation: 100 %
PEEP: 5 cmH2O
Patient temperature: 98.6
RATE: 18 resp/min
pCO2 arterial: 20.8 mmHg — ABNORMAL LOW (ref 32.0–48.0)
pH, Arterial: 7.335 — ABNORMAL LOW (ref 7.350–7.450)
pO2, Arterial: 366 mmHg — ABNORMAL HIGH (ref 83.0–108.0)

## 2021-06-10 LAB — URINE CULTURE: Culture: >=100000 — AB

## 2021-06-10 LAB — PREPARE RBC (CROSSMATCH)

## 2021-06-10 LAB — HEMOGLOBIN AND HEMATOCRIT, BLOOD
HCT: 32.4 % — ABNORMAL LOW (ref 39.0–52.0)
HCT: 30.1 % — ABNORMAL LOW (ref 39.0–52.0)
Hemoglobin: 11.1 g/dL — ABNORMAL LOW (ref 13.0–17.0)
Hemoglobin: 10.4 g/dL — ABNORMAL LOW (ref 13.0–17.0)

## 2021-06-10 LAB — TROPONIN I (HIGH SENSITIVITY): Troponin I (High Sensitivity): 14 ng/L (ref <18)

## 2021-06-10 LAB — CORTISOL: Cortisol, Plasma: 25 ug/dL

## 2021-06-10 LAB — MRSA NEXT GEN BY PCR, NASAL: MRSA by PCR Next Gen: DETECTED — AB

## 2021-06-10 LAB — HEMOGLOBIN A1C
Hgb A1c MFr Bld: 6.2 % — ABNORMAL HIGH (ref 4.8–5.6)
Mean Plasma Glucose: 131.24 mg/dL

## 2021-06-10 LAB — LACTIC ACID, PLASMA
Lactic Acid, Venous: >11 mmol/L (ref 0.5–1.9)
Lactic Acid, Venous: 2.8 mmol/L (ref 0.5–1.9)

## 2021-06-10 LAB — PHOSPHORUS: Phosphorus: 4.4 mg/dL (ref 2.5–4.6)

## 2021-06-10 LAB — LIPASE, BLOOD: Lipase: 59 U/L — ABNORMAL HIGH (ref 11–51)

## 2021-06-10 LAB — MAGNESIUM: Magnesium: 1.7 mg/dL (ref 1.7–2.4)

## 2021-06-10 LAB — PROCALCITONIN: Procalcitonin: <0.1 ng/mL

## 2021-06-10 SURGERY — CANCELLED PROCEDURE
Anesthesia: Monitor Anesthesia Care

## 2021-06-10 SURGERY — ESOPHAGOGASTRODUODENOSCOPY (EGD) WITH PROPOFOL
Anesthesia: Monitor Anesthesia Care

## 2021-06-10 MED ORDER — FENTANYL CITRATE (PF) 100 MCG/2ML IJ SOLN
50.0000 ug | Freq: Once | INTRAMUSCULAR | Status: AC
Start: 2021-06-10 — End: 2021-06-10
  Administered 2021-06-10: 50 ug via INTRAVENOUS

## 2021-06-10 MED ORDER — CHLORHEXIDINE GLUCONATE 0.12% ORAL RINSE (MEDLINE KIT)
15.0000 mL | Freq: Two times a day (BID) | OROMUCOSAL | Status: DC
  Administered 2021-06-10 – 2021-06-11 (×3): 15 mL via OROMUCOSAL

## 2021-06-10 MED ORDER — FENTANYL CITRATE (PF) 100 MCG/2ML IJ SOLN
INTRAMUSCULAR | Status: DC | PRN
  Administered 2021-06-10 (×2): 25 ug via INTRAVENOUS

## 2021-06-10 MED ORDER — DIPHENHYDRAMINE HCL 50 MG/ML IJ SOLN
INTRAMUSCULAR | Status: AC
  Filled 2021-06-10: qty 1

## 2021-06-10 MED ORDER — SODIUM CHLORIDE 0.9 % IV SOLN
250.0000 mL | INTRAVENOUS | Status: DC
Start: 2021-06-10 — End: 2021-06-19
  Administered 2021-06-10: 250 mL via INTRAVENOUS

## 2021-06-10 MED ORDER — SODIUM CHLORIDE (PF) 0.9 % IJ SOLN
PREFILLED_SYRINGE | INTRAMUSCULAR | Status: DC | PRN
  Administered 2021-06-10: 2.5 mL

## 2021-06-10 MED ORDER — FENTANYL 2500MCG IN NS 250ML (10MCG/ML) PREMIX INFUSION
50.0000 ug/h | INTRAVENOUS | Status: DC
  Filled 2021-06-10: qty 250

## 2021-06-10 MED ORDER — FAMOTIDINE 40 MG/5ML PO SUSR: 20.0000 mg | Freq: Two times a day (BID) | ORAL | Status: DC

## 2021-06-10 MED ORDER — PANTOPRAZOLE INFUSION (NEW) - SIMPLE MED
8.0000 mg/h | INTRAVENOUS | Status: DC
  Administered 2021-06-10 – 2021-06-12 (×5): 8 mg/h via INTRAVENOUS
  Filled 2021-06-10: qty 100
  Filled 2021-06-10 (×4): qty 80
  Filled 2021-06-10: qty 100
  Filled 2021-06-10: qty 80
  Filled 2021-06-10: qty 100

## 2021-06-10 MED ORDER — MIDAZOLAM HCL (PF) 5 MG/ML IJ SOLN
INTRAMUSCULAR | Status: AC
  Filled 2021-06-10: qty 2

## 2021-06-10 MED ORDER — NOREPINEPHRINE 4 MG/250ML-% IV SOLN: 0.0000 ug/min | INTRAVENOUS | Status: DC

## 2021-06-10 MED ORDER — DOCUSATE SODIUM 50 MG/5ML PO LIQD: 100.0000 mg | Freq: Two times a day (BID) | ORAL | Status: DC

## 2021-06-10 MED ORDER — SODIUM CHLORIDE 0.9 % IV SOLN
250.0000 mg | Freq: Once | INTRAVENOUS | Status: AC
  Administered 2021-06-10: 250 mg via INTRAVENOUS
  Filled 2021-06-10: qty 5

## 2021-06-10 MED ORDER — FENTANYL CITRATE (PF) 100 MCG/2ML IJ SOLN
INTRAMUSCULAR | Status: AC
  Filled 2021-06-10: qty 4

## 2021-06-10 MED ORDER — LACTATED RINGERS IV BOLUS
2000.0000 mL | Freq: Once | INTRAVENOUS | Status: AC
  Administered 2021-06-10: 2000 mL via INTRAVENOUS

## 2021-06-10 MED ORDER — SODIUM BICARBONATE 8.4 % IV SOLN
INTRAVENOUS | Status: AC
  Filled 2021-06-10: qty 50

## 2021-06-10 MED ORDER — NOREPINEPHRINE 4 MG/250ML-% IV SOLN
2.0000 ug/min | INTRAVENOUS | Status: DC
  Administered 2021-06-10: 2 ug/min via INTRAVENOUS

## 2021-06-10 MED ORDER — EPINEPHRINE 1 MG/10ML IJ SOSY
PREFILLED_SYRINGE | INTRAMUSCULAR | Status: AC
  Filled 2021-06-10: qty 10

## 2021-06-10 MED ORDER — SODIUM BICARBONATE 8.4 % IV SOLN
INTRAVENOUS | Status: AC
  Administered 2021-06-10: 100 meq via INTRAVENOUS
  Filled 2021-06-10: qty 50

## 2021-06-10 MED ORDER — SODIUM CHLORIDE 0.9 % IV SOLN
2.0000 g | Freq: Two times a day (BID) | INTRAVENOUS | Status: DC
  Administered 2021-06-10 – 2021-06-14 (×9): 2 g via INTRAVENOUS
  Filled 2021-06-10 (×9): qty 2

## 2021-06-10 MED ORDER — PROPOFOL 1000 MG/100ML IV EMUL
INTRAVENOUS | Status: AC
  Filled 2021-06-10: qty 100

## 2021-06-10 MED ORDER — FENTANYL BOLUS VIA INFUSION
50.0000 ug | INTRAVENOUS | Status: DC | PRN
  Filled 2021-06-10: qty 100

## 2021-06-10 MED ORDER — SODIUM CHLORIDE 0.9% IV SOLUTION: Freq: Once | INTRAVENOUS | Status: DC

## 2021-06-10 MED ORDER — MUPIROCIN 2 % EX OINT
1.0000 "application " | TOPICAL_OINTMENT | Freq: Two times a day (BID) | CUTANEOUS | Status: AC
  Administered 2021-06-10 – 2021-06-14 (×10): 1 via NASAL
  Filled 2021-06-10 (×3): qty 22

## 2021-06-10 MED ORDER — PANTOPRAZOLE SODIUM 40 MG IV SOLR: 40.0000 mg | Freq: Two times a day (BID) | INTRAVENOUS | Status: DC

## 2021-06-10 MED ORDER — POLYETHYLENE GLYCOL 3350 17 G PO PACK: 17.0000 g | PACK | Freq: Every day | ORAL | Status: DC

## 2021-06-10 MED ORDER — NOREPINEPHRINE 4 MG/250ML-% IV SOLN
INTRAVENOUS | Status: AC
  Administered 2021-06-10: 10 ug/min via INTRAVENOUS
  Filled 2021-06-10: qty 250

## 2021-06-10 MED ORDER — MAGNESIUM SULFATE 4 GM/100ML IV SOLN
4.0000 g | Freq: Once | INTRAVENOUS | Status: AC
  Administered 2021-06-10: 4 g via INTRAVENOUS
  Filled 2021-06-10: qty 100

## 2021-06-10 MED ORDER — MIDAZOLAM HCL (PF) 5 MG/ML IJ SOLN
INTRAMUSCULAR | Status: DC | PRN
  Administered 2021-06-10 (×2): 2 mg via INTRAVENOUS

## 2021-06-10 MED ORDER — ORAL CARE MOUTH RINSE
15.0000 mL | OROMUCOSAL | Status: DC
  Administered 2021-06-10 – 2021-06-11 (×10): 15 mL via OROMUCOSAL

## 2021-06-10 MED ORDER — INSULIN ASPART 100 UNIT/ML IJ SOLN
0.0000 [IU] | INTRAMUSCULAR | Status: DC
  Administered 2021-06-10: 3 [IU] via SUBCUTANEOUS
  Administered 2021-06-10: 2 [IU] via SUBCUTANEOUS
  Administered 2021-06-10 (×2): 3 [IU] via SUBCUTANEOUS
  Administered 2021-06-11 – 2021-06-12 (×5): 2 [IU] via SUBCUTANEOUS

## 2021-06-10 MED ORDER — ROCURONIUM BROMIDE 10 MG/ML (PF) SYRINGE
PREFILLED_SYRINGE | INTRAVENOUS | Status: AC
  Filled 2021-06-10: qty 10

## 2021-06-10 MED ORDER — MIDAZOLAM HCL 2 MG/2ML IJ SOLN
1.0000 mg | INTRAMUSCULAR | Status: DC | PRN
  Administered 2021-06-10 – 2021-06-11 (×7): 2 mg via INTRAVENOUS
  Administered 2021-06-11: 4 mg via INTRAVENOUS
  Filled 2021-06-10 (×8): qty 2
  Filled 2021-06-10: qty 4
  Filled 2021-06-10: qty 2

## 2021-06-10 MED ORDER — FENTANYL CITRATE (PF) 100 MCG/2ML IJ SOLN
25.0000 ug | INTRAMUSCULAR | Status: DC | PRN
  Administered 2021-06-10 (×2): 50 ug via INTRAVENOUS
  Administered 2021-06-11: 100 ug via INTRAVENOUS
  Administered 2021-06-11: 50 ug via INTRAVENOUS
  Administered 2021-06-11 (×2): 100 ug via INTRAVENOUS
  Filled 2021-06-10 (×9): qty 2

## 2021-06-10 MED ORDER — FAMOTIDINE IN NACL 20-0.9 MG/50ML-% IV SOLN
20.0000 mg | Freq: Two times a day (BID) | INTRAVENOUS | Status: DC
  Administered 2021-06-10 – 2021-06-14 (×8): 20 mg via INTRAVENOUS
  Filled 2021-06-10 (×9): qty 50

## 2021-06-10 MED ORDER — SODIUM BICARBONATE 8.4 % IV SOLN: 100.0000 meq | Freq: Once | INTRAVENOUS | Status: AC

## 2021-06-10 MED ORDER — SODIUM CHLORIDE 0.9% IV SOLUTION: Freq: Once | INTRAVENOUS | Status: AC

## 2021-06-10 MED ORDER — PROSOURCE TF PO LIQD
45.0000 mL | Freq: Two times a day (BID) | ORAL | Status: DC
  Administered 2021-06-12: 45 mL
  Filled 2021-06-10 (×2): qty 45

## 2021-06-10 MED ORDER — HYDROXYZINE HCL 25 MG PO TABS
25.0000 mg | ORAL_TABLET | Freq: Once | ORAL | Status: AC
  Administered 2021-06-10: 25 mg via ORAL
  Filled 2021-06-10: qty 1

## 2021-06-10 MED ORDER — VITAL HIGH PROTEIN PO LIQD: 1000.0000 mL | ORAL | Status: DC

## 2021-06-10 MED ORDER — CHLORHEXIDINE GLUCONATE CLOTH 2 % EX PADS
6.0000 | MEDICATED_PAD | Freq: Every day | CUTANEOUS | Status: DC
  Administered 2021-06-10 – 2021-06-19 (×11): 6 via TOPICAL

## 2021-06-10 SURGICAL SUPPLY — 15 items

## 2021-06-10 SURGICAL SUPPLY — 14 items

## 2021-06-10 NOTE — Progress Notes (Signed)
Orders received to start tube feedings. OG was removed during bedside EGD. Discussed with Gaylyn Lambert, NP. Verbal order to hold TF and not put OG back down tonight. Reevaluate in AM.

## 2021-06-10 NOTE — Progress Notes (Addendum)
Patient ID: [name]., male   DOB: Jun 26, 1941, 80 y.o.   MRN: [id number]    Progress Note   Subjective   Day # 2 CC; GI bleed  Patient was to have EGD this a.m., for anemia, and onset of melena yesterday. Patient had 9.6 hemoglobin yesterday, this a.m. hemoglobin down to 4. Earlier this a.m. he was noted to have altered mental status with some agitation, rapid response was called, he did not have cardiac arrest but is now intubated and requiring Levophed for hypotension. No report of bright red rectal bleeding overnight but currently having copious amounts of clotted dark reddish stool OG to be placed  Pro time/INR yesterday normal   Objective   Vital signs in last 24 hours: Temp:  [98 F (36.7 C)-98.8 F (37.1 C)] 98 F (36.7 C) (08/13 0828) Pulse Rate:  [82-114] 114 (08/13 0820) Resp:  [16-33] 26 (08/13 0900) BP: (93-123)/(50-70) 93/58 (08/13 0645) SpO2:  [97 %-100 %] 100 % (08/13 0645) FiO2 (%):  [100 %] 100 % (08/13 0820) Last BM Date: 06/09/21 General:    Elderly white male , restless on vent, pale Heart: Tachycardic regular rate and rhythm; no murmurs Lungs: Respirations even and unlabored, lungs CTA bilaterally Abdomen:  Soft, nontender and nondistended. Normal bowel sounds. Extremities:  Without edema. Neurologic:  Alert and oriented,  grossly normal neurologically. Psych:  Cooperative. Normal mood and affect.  Intake/Output from previous day: 08/12 0701 - 08/13 0700 In: 1942.6 [P.O.:200; I.V.:1642.6; IV Piggyback:100] Out: 1450 [Urine:1450] Intake/Output this shift: No intake/output data recorded.  Lab Results: Recent Labs    06/08/21 0426 06/09/21 0436 06/10/21 0806  WBC 11.1* 10.3 12.3*  HGB 9.7* 9.6* 4.0*  HCT 30.0* 30.9* 14.4*  PLT 260 304 233   BMET Recent Labs    06/08/21 0426 06/09/21 0436 06/10/21 0809  NA 136 141 141  K 3.8 3.8 4.3  CL 104 107 113*  CO2 25 25 12*  GLUCOSE 149* 136* 222*  BUN 28* 22 36*  CREATININE 0.84 0.84  1.21  CALCIUM 8.5* 8.4* 6.9*   LFT Recent Labs    06/10/21 0809  PROT 3.4*  ALBUMIN 1.4*  AST 27  ALT 22  ALKPHOS 43  BILITOT 0.4   PT/INR Recent Labs    06/07/21 1752  LABPROT 14.2  INR 1.1    Studies/Results: DG Chest 1 View  Result Date: 06/09/2021 CLINICAL DATA:  Tachycardia. Status post cystectomy 7/22. Failure to thrive. EXAM: CHEST  1 VIEW COMPARISON:  One-view chest x-ray 06/07/2021 FINDINGS: Heart size normal. Atherosclerotic changes are present at the aortic arch. Lung volumes are low. No edema or effusion is present. No focal airspace disease is present. IMPRESSION: 1. Low lung volumes. 2. No acute cardiopulmonary disease. Electronically Signed   By: San Morelle M.D.   On: 06/09/2021 14:10   US RENAL  Result Date: 06/08/2021 CLINICAL DATA:  Elevated serum creatinine EXAM: RENAL / URINARY TRACT ULTRASOUND COMPLETE COMPARISON:  None. FINDINGS: Right Kidney: Renal measurements: 12.1 x 6.1 x 5.1 cm = volume: 197 mL. No hydronephrosis. There is a 5.4 x 3.9 x 3.1 cm cystic lesion in the lower pole of the right kidney. This appears simple. No nephrolithiasis visualized. Left Kidney: Renal measurements: 13.0 x 5.7 x 5.6 cm = volume: 215 mL. Moderate hydronephrosis. No cystic or solid lesion. No nephrolithiasis visualized. Bladder: Prior cystectomy Other: None. IMPRESSION: Moderate left-sided hydronephrosis. No right-sided hydronephrosis. 5.4 cm simple appearing right lower pole renal cyst. Electronically Signed  By: Maurine Simmering M.D.   On: 06/08/2021 13:49       Assessment / Plan:    #55 80 year old white male admitted after drop in hemoglobin from his baseline of 14 down to 9.6. Patient noted to have melena yesterday and was scheduled to have EGD this morning No anticoagulation and INR normal yesterday Early this a.m. patient had to be rapid response for altered mental status and hypotension, he has been intubated and is now on pressors  Hemoglobin down to 4 this  a.m.-has not been transfused as yet Now passing fairly large amounts of dark reddish stool  #2 coronary artery disease 3.  Sleep apnea 4.  Tourette's syndrome #5 history of bladder cancer with cystectomy and ileal conduit   Plan; n.p.o.,  place OG tube PPI infusion  Stat transfusion of 2 units of packed RBCs, expect he will need at least 2 more later this a.m. Serial hemoglobins every 6 hours try to keep hemoglobin 7 If any blood per OG then can consider EGD once he has stabilized today If no blood per OG then may require CT angiography and embolization  Will discuss further with Dr. Henrene Pastor    Principal Problem:   SIRS (systemic inflammatory response syndrome) ([place]) Active Problems:   OSA (obstructive sleep apnea)   Bladder cancer ([place])   Mixed hyperlipidemia   Essential hypertension   Type 2 diabetes mellitus ([place])   Malnutrition of moderate degree     LOS: 3 days   Amy Esterwood PA-C 06/10/2021, 9:51 AM  GI ATTENDING  Interval history data reviewed.  Patient personally seen and examined.  Agree with several progress note.  I have evaluated the patient at the bedside.  He has bright red blood in his NG tube.  Critical care medicine is placing central line.  He has received 2 units of packed red blood cells.  Blood pressure has improved a bit.  We are ordering 2 additional units of packed red blood cells.  We will give IV erythromycin in anticipation of emergent endoscopy this afternoon.  The patient is critically ill and HIGH RISK.  Docia Chuck. Geri Seminole., M.D. [facility] Division of Gastroenterology

## 2021-06-10 NOTE — Progress Notes (Signed)
Patient was anxious,agitated and restless. Taking off his cloth, trying to pulling at lines and pulled off his CPAP several time. Paged oncall and administered Atarax x 1. No acute distress at this time.

## 2021-06-10 NOTE — Progress Notes (Signed)
Rapid Response, Chritian, RN, VS taken BP low with fluctuating HR and now none responsive. See RR notes.

## 2021-06-10 NOTE — H&P (View-Only) (Signed)
Patient ID: [name]., male   DOB: Mar 21, 1941, 80 y.o.   MRN: [id number]    Progress Note   Subjective   Day # 2 CC; GI bleed  Patient was to have EGD this a.m., for anemia, and onset of melena yesterday. Patient had 9.6 hemoglobin yesterday, this a.m. hemoglobin down to 4. Earlier this a.m. he was noted to have altered mental status with some agitation, rapid response was called, he did not have cardiac arrest but is now intubated and requiring Levophed for hypotension. No report of bright red rectal bleeding overnight but currently having copious amounts of clotted dark reddish stool OG to be placed  Pro time/INR yesterday normal   Objective   Vital signs in last 24 hours: Temp:  [98 F (36.7 C)-98.8 F (37.1 C)] 98 F (36.7 C) (08/13 0828) Pulse Rate:  [82-114] 114 (08/13 0820) Resp:  [16-33] 26 (08/13 0900) BP: (93-123)/(50-70) 93/58 (08/13 0645) SpO2:  [97 %-100 %] 100 % (08/13 0645) FiO2 (%):  [100 %] 100 % (08/13 0820) Last BM Date: 06/09/21 General:    Elderly white male , restless on vent, pale Heart: Tachycardic regular rate and rhythm; no murmurs Lungs: Respirations even and unlabored, lungs CTA bilaterally Abdomen:  Soft, nontender and nondistended. Normal bowel sounds. Extremities:  Without edema. Neurologic:  Alert and oriented,  grossly normal neurologically. Psych:  Cooperative. Normal mood and affect.  Intake/Output from previous day: 08/12 0701 - 08/13 0700 In: 1942.6 [P.O.:200; I.V.:1642.6; IV Piggyback:100] Out: 1450 [Urine:1450] Intake/Output this shift: No intake/output data recorded.  Lab Results: Recent Labs    06/08/21 0426 06/09/21 0436 06/10/21 0806  WBC 11.1* 10.3 12.3*  HGB 9.7* 9.6* 4.0*  HCT 30.0* 30.9* 14.4*  PLT 260 304 233   BMET Recent Labs    06/08/21 0426 06/09/21 0436 06/10/21 0809  NA 136 141 141  K 3.8 3.8 4.3  CL 104 107 113*  CO2 25 25 12*  GLUCOSE 149* 136* 222*  BUN 28* 22 36*  CREATININE 0.84 0.84  1.21  CALCIUM 8.5* 8.4* 6.9*   LFT Recent Labs    06/10/21 0809  PROT 3.4*  ALBUMIN 1.4*  AST 27  ALT 22  ALKPHOS 43  BILITOT 0.4   PT/INR Recent Labs    06/07/21 1752  LABPROT 14.2  INR 1.1    Studies/Results: DG Chest 1 View  Result Date: 06/09/2021 CLINICAL DATA:  Tachycardia. Status post cystectomy 7/22. Failure to thrive. EXAM: CHEST  1 VIEW COMPARISON:  One-view chest x-ray 06/07/2021 FINDINGS: Heart size normal. Atherosclerotic changes are present at the aortic arch. Lung volumes are low. No edema or effusion is present. No focal airspace disease is present. IMPRESSION: 1. Low lung volumes. 2. No acute cardiopulmonary disease. Electronically Signed   By: San Morelle M.D.   On: 06/09/2021 14:10   US RENAL  Result Date: 06/08/2021 CLINICAL DATA:  Elevated serum creatinine EXAM: RENAL / URINARY TRACT ULTRASOUND COMPLETE COMPARISON:  None. FINDINGS: Right Kidney: Renal measurements: 12.1 x 6.1 x 5.1 cm = volume: 197 mL. No hydronephrosis. There is a 5.4 x 3.9 x 3.1 cm cystic lesion in the lower pole of the right kidney. This appears simple. No nephrolithiasis visualized. Left Kidney: Renal measurements: 13.0 x 5.7 x 5.6 cm = volume: 215 mL. Moderate hydronephrosis. No cystic or solid lesion. No nephrolithiasis visualized. Bladder: Prior cystectomy Other: None. IMPRESSION: Moderate left-sided hydronephrosis. No right-sided hydronephrosis. 5.4 cm simple appearing right lower pole renal cyst. Electronically Signed  By: Maurine Simmering M.D.   On: 06/08/2021 13:49       Assessment / Plan:    #23 79 year old white male admitted after drop in hemoglobin from his baseline of 14 down to 9.6. Patient noted to have melena yesterday and was scheduled to have EGD this morning No anticoagulation and INR normal yesterday Early this a.m. patient had to be rapid response for altered mental status and hypotension, he has been intubated and is now on pressors  Hemoglobin down to 4 this  a.m.-has not been transfused as yet Now passing fairly large amounts of dark reddish stool  #2 coronary artery disease 3.  Sleep apnea 4.  Tourette's syndrome #5 history of bladder cancer with cystectomy and ileal conduit   Plan; n.p.o.,  place OG tube PPI infusion  Stat transfusion of 2 units of packed RBCs, expect he will need at least 2 more later this a.m. Serial hemoglobins every 6 hours try to keep hemoglobin 7 If any blood per OG then can consider EGD once he has stabilized today If no blood per OG then may require CT angiography and embolization  Will discuss further with Dr. Henrene Pastor    Principal Problem:   SIRS (systemic inflammatory response syndrome) ([place]) Active Problems:   OSA (obstructive sleep apnea)   Bladder cancer ([place])   Mixed hyperlipidemia   Essential hypertension   Type 2 diabetes mellitus ([place])   Malnutrition of moderate degree     LOS: 3 days   Amy Esterwood PA-C 06/10/2021, 9:51 AM  GI ATTENDING  Interval history data reviewed.  Patient personally seen and examined.  Agree with several progress note.  I have evaluated the patient at the bedside.  He has bright red blood in his NG tube.  Critical care medicine is placing central line.  He has received 2 units of packed red blood cells.  Blood pressure has improved a bit.  We are ordering 2 additional units of packed red blood cells.  We will give IV erythromycin in anticipation of emergent endoscopy this afternoon.  The patient is critically ill and HIGH RISK.  Docia Chuck. Geri Seminole., M.D. [facility] Division of Gastroenterology

## 2021-06-10 NOTE — Progress Notes (Signed)
Family notified

## 2021-06-10 NOTE — Progress Notes (Addendum)
Urology Progress Note   80 y.o. male with history of bladder cancer which was muscle invasive upon TURBT with left-sided hydronephrosis upstaging it to T3 who is status post radical cystectomy and ileal conduit urinary diversion on 05/19/2021 with final pathology of pT1a.  He was discharged 6 days postoperatively after a relatively uncomplicated hospital course after return of bowel function was achieved.  He presents to the emergency room with failure to thrive at home.  Found to have a white blood cell count of 15, hypotensive.  He was afebrile and his creatinine was within normal limits.  He was just dated adequately.  Urine culture has revealed gram-negative rods.  Sensitivities and susceptibilities and speciation are pending.   Initial CT imaging on 06/07/2021 revealed mild right hydronephrosis and moderate left hydronephrosis.  Ureteral stents are adequately positioned.  He is making good urine function and his creatinine is stable.  Renal ultrasound on hospital day 1 revealed resolved right hydronephrosis and mild left hydronephrosis.  This is stable compared to his prior imaging before his surgery.    Subjective: This morning patient became acutely hypotensive, unresponsive, hypoxic.  Now status post intubation at the bedside and transferred to the ICU.  Started on vasopressors.  Continues to make adequate urine output.  Ostomy appears pink and healthy.  He has not spiked fevers.  His creatinine is 1.2 today which is up from 0.8 yesterday.  White blood cell count is 12.3.  Remains afebrile  Objective: Vital signs in last 24 hours: Temp:  [98 F (36.7 C)-98.8 F (37.1 C)] 98 F (36.7 C) (08/13 0828) Pulse Rate:  [82-114] 114 (08/13 0820) Resp:  [16-33] 26 (08/13 0900) BP: (93-123)/(50-70) 93/58 (08/13 0645) SpO2:  [97 %-100 %] 100 % (08/13 0645) FiO2 (%):  [100 %] 100 % (08/13 0820)  Intake/Output from previous day: 08/12 0701 - 08/13 0700 In: 1942.6 [P.O.:200; I.V.:1642.6; IV  Piggyback:100] Out: 1450 [Urine:1450] Intake/Output this shift: No intake/output data recorded.  Physical Exam:  General: Intubated CV: Regular rate Lungs: On the ventilator Abdomen: Abdomen is soft.  Ileostomy is pink and healthy-appearing with ureteral stents in place.  Clear yellow urine output. Ext: NT, No erythema  Lab Results: Recent Labs    06/08/21 0426 06/09/21 0436 06/10/21 0806  HGB 9.7* 9.6* 4.0*  HCT 30.0* 30.9* 14.4*   Recent Labs    06/09/21 0436 06/10/21 0809  NA 141 141  K 3.8 4.3  CL 107 113*  CO2 25 12*  GLUCOSE 136* 222*  BUN 22 36*  CREATININE 0.84 1.21  CALCIUM 8.4* 6.9*    Studies/Results: DG Chest 1 View  Result Date: 06/09/2021 CLINICAL DATA:  Tachycardia. Status post cystectomy 7/22. Failure to thrive. EXAM: CHEST  1 VIEW COMPARISON:  One-view chest x-ray 06/07/2021 FINDINGS: Heart size normal. Atherosclerotic changes are present at the aortic arch. Lung volumes are low. No edema or effusion is present. No focal airspace disease is present. IMPRESSION: 1. Low lung volumes. 2. No acute cardiopulmonary disease. Electronically Signed   By: San Morelle M.D.   On: 06/09/2021 14:10   US RENAL  Result Date: 06/08/2021 CLINICAL DATA:  Elevated serum creatinine EXAM: RENAL / URINARY TRACT ULTRASOUND COMPLETE COMPARISON:  None. FINDINGS: Right Kidney: Renal measurements: 12.1 x 6.1 x 5.1 cm = volume: 197 mL. No hydronephrosis. There is a 5.4 x 3.9 x 3.1 cm cystic lesion in the lower pole of the right kidney. This appears simple. No nephrolithiasis visualized. Left Kidney: Renal measurements: 13.0 x 5.7  x 5.6 cm = volume: 215 mL. Moderate hydronephrosis. No cystic or solid lesion. No nephrolithiasis visualized. Bladder: Prior cystectomy Other: None. IMPRESSION: Moderate left-sided hydronephrosis. No right-sided hydronephrosis. 5.4 cm simple appearing right lower pole renal cyst. Electronically Signed   By: Maurine Simmering M.D.   On: 06/08/2021 13:49     Assessment:   80 y.o. male with history of bladder cancer which was muscle invasive upon TURBT with left-sided hydronephrosis upstaging it to T3 who is status post radical cystectomy and ileal conduit urinary diversion on 05/19/2021 with final pathology of pT1a.  He was discharged 6 days postoperatively after a relatively uncomplicated hospital course after return of bowel function was achieved.  He presents to the emergency room with failure to thrive at home.  Found to have a white blood cell count of 15, hypotensive.  He was afebrile and his creatinine was within normal limits.  He was just dated adequately.  Urine culture has revealed gram-negative rods.  Sensitivities and susceptibilities and speciation are pending.   Initial CT imaging on 06/07/2021 revealed mild right hydronephrosis and moderate left hydronephrosis.  Ureteral stents are adequately positioned.  He is making good urine function and his creatinine is stable.  Renal ultrasound on hospital day 1 revealed resolved right hydronephrosis and mild left hydronephrosis.  This is stable compared to his prior imaging before his surgery.   Recommendations: -No acute urologic intervention indicated for now. -Agree with work-up per the primary team for possible causes of shock and resultant intubation.  Agree with resuscitation by primary team. -Continue ureteral stents within the urostomy bag to drainage -Agree with antibiotics for complicated UTI course tailored per his culture data -Recommend wound ostomy nurse consultation for ostomy care      LOS: 3 days    Patient seen and examined with the resident.  Agree with assessment and plan.  No intervention recommended from a urological standpoint at this time.  Continue antibiotics and work-up for shock and ICU care.

## 2021-06-10 NOTE — Consult Note (Addendum)
NAME:  Alex Eden., MRN:  [id number], DOB:  [birth year], LOS: 3 ADMISSION DATE:  06/07/2021, CONSULTATION DATE:  06/10/21 REFERRING MD:  Dr Kathreen Cosier of [facility], CHIEF COMPLAINT:  Near respiratory arrest -> then Massive Gi bleed post intubation   History of Present Illness:  80 year old with medical history significant for bladder cancer status post cystectomy, prostatectomy and ileal conduit surgery on 05/19/2021, coronary artery disease, hyperlipidemia, essential hypertension, OSA on CPAP, Tourette's syndrome.  Prior to his urologic surgery on 05/19/2021 he was driving and had completely normal activities of daily living.  Post surgery has had failure to thrive, poor oral intake.Labs at admit with hgb 11, Bun 36/creat normal, Lactcic acid 2.5.  His admission CT abdomen pelvis showed interval prostatectomy and cystectomy with right lower quadrant urostomy and mild right and moderate left hydronephrosis with stents in place [deemed to be baseline per urology according to the hospitalist].  Got admitted 06/07/2021 for treatment of UTI with cultures 06/07/2021 growing cefazolin resistant greater than 100 K Enterobacter cloacae  Overall he was progressing well but on 06/09/2021 his hemoglobin was found to be 9.6 g/dL along with melenic stools and heme positive stools.  He reported history of abdominal pain prior to admission but no NSAID use.  Evaluated by GI and scheduled for EGD 06/10/2021  However overnight early hours of 06/10/2021 noticed to be agitated and confused with subsequent respiratory depression and emergently intubated approximately 7:45 AM 06/10/2021.  Never lost pulse post intubation had ongoing circulatory shock requiring pressors through peripheral IV.  Noticed to have significant drop in hemoglobin to 4 g% and significant worsening/onset of lactic acidosis [11] and subsequently in the ICU melena and bright blood per rectum.  CCM taking over primary service  Pertinent  Medical History     has a past medical history of Anginal pain ([place]) (1997), Coronary artery disease, First degree AV block (01/16/2021), History of kidney stones, Hypercholesteremia, Hypertension, Neuromuscular disorder ([place]), OSA (obstructive sleep apnea) (08/05/2013), Pre-diabetes, Right bundle branch block (RBBB) (01/16/2021), Sleep apnea, Tourette syndrome, and Tourette's syndrome (04/01/2013).   reports that he quit smoking about 25 years ago. His smoking use included cigarettes. He has never used smokeless tobacco.  Past Surgical History:  Procedure Laterality Date   [place]   CATARACT EXTRACTION  11/2012   CYSTOSCOPY WITH INJECTION N/A 05/19/2021   Procedure: CYSTOSCOPY WITH INJECTION OF INDOCYANINE GREEN DYE;  Surgeon: Alexis Frock, MD;  Location: [facility];  Service: Urology;  Laterality: N/A;   heart stent  01/16/1996   LYMPH NODE DISSECTION Bilateral 05/19/2021   Procedure: LYMPH NODE DISSECTION;  Surgeon: Alexis Frock, MD;  Location: [facility];  Service: Urology;  Laterality: Bilateral;   ROBOT ASSISTED LAPAROSCOPIC COMPLETE CYSTECT ILEAL CONDUIT N/A 05/19/2021   Procedure: XI ROBOTIC ASSISTED LAPAROSCOPIC COMPLETE CYSTECT ILEAL CONDUIT;  Surgeon: Alexis Frock, MD;  Location: [facility];  Service: Urology;  Laterality: N/A;   ROBOT ASSISTED LAPAROSCOPIC RADICAL PROSTATECTOMY N/A 05/19/2021   Procedure: XI ROBOTIC ASSISTED LAPAROSCOPIC RADICAL PROSTATECTOMY;  Surgeon: Alexis Frock, MD;  Location: [facility];  Service: Urology;  Laterality: N/A;   TRANSURETHRAL RESECTION OF BLADDER TUMOR Bilateral 01/17/2021   Procedure: TRANSURETHRAL RESECTION OF BLADDER TUMOR (TURBT);  Surgeon: Irine Seal, MD;  Location: [facility];  Service: Urology;  Laterality: Bilateral;  GENERAL ANESTHESIA WITH  PARLYSIS   TRANSURETHRAL RESECTION OF BLADDER TUMOR Left 02/03/2021   Procedure: TRANSURETHRAL RESECTION OF BLADDER TUMOR (TURBT);  Surgeon: Jeffie Pollock,  Jenny Reichmann, MD;  Location: [facility];  Service: Urology;   Laterality: Left;  WITH PARALYPIC    No Known Allergies  Immunization History  Administered Date(s) Administered   Moderna Sars-Covid-2 Vaccination 12/13/2019, 01/10/2020    Family History  Problem Relation Age of Onset   Pancreatic cancer Mother    Heart disease Father      Current Facility-Administered Medications:    0.9 %  sodium chloride infusion, , Intravenous, Continuous, Georgette Shell, MD, Last Rate: 100 mL/hr at 06/09/21 1925, New Bag at 06/09/21 1925   0.9 %  sodium chloride infusion, 250 mL, Intravenous, Continuous, Georgette Shell, MD, Last Rate: 10 mL/hr at 06/10/21 1000, 250 mL at 06/10/21 1000   acetaminophen (TYLENOL) tablet 650 mg, 650 mg, Oral, Q6H PRN, 650 mg at 06/09/21 2216 **OR** acetaminophen (TYLENOL) suppository 650 mg, 650 mg, Rectal, Q6H PRN, Jonelle Sidle, Mohammad L, MD   cefTRIAXone (ROCEPHIN) 1 g in sodium chloride 0.9 % 100 mL IVPB, 1 g, Intravenous, Q24H, Garba, Mohammad L, MD, Last Rate: 200 mL/hr at 06/09/21 2359, 1 g at 06/09/21 2359   chlorhexidine gluconate (MEDLINE KIT) (PERIDEX) 0.12 % solution 15 mL, 15 mL, Mouth Rinse, BID, Georgette Shell, MD, 15 mL at 06/10/21 0830   Chlorhexidine Gluconate Cloth 2 % PADS 6 each, 6 each, Topical, Daily, Georgette Shell, MD, 6 each at 06/10/21 0913   fentaNYL (SUBLIMAZE) injection 25-100 mcg, 25-100 mcg, Intravenous, Q2H PRN, Brand Males, MD, 50 mcg at 06/10/21 0908   insulin aspart (novoLOG) injection 0-15 Units, 0-15 Units, Subcutaneous, Q4H, Jovon Streetman, Belva Crome, MD, 3 Units at 06/10/21 0900   LORazepam (ATIVAN) tablet 0.5 mg, 0.5 mg, Oral, QHS, Garba, Mohammad L, MD, 0.5 mg at 06/09/21 2217   MEDLINE mouth rinse, 15 mL, Mouth Rinse, 10 times per day, Georgette Shell, MD, 15 mL at 06/10/21 0914   midazolam (VERSED) injection 1-4 mg, 1-4 mg, Intravenous, Q2H PRN, Brand Males, MD, 2 mg at 06/10/21 1015   mirtazapine (REMERON) tablet 7.5 mg, 7.5 mg, Oral, QHS, Georgette Shell,  MD, 7.5 mg at 06/09/21 2216   multivitamin with minerals tablet 1 tablet, 1 tablet, Oral, Daily, Elwyn Reach, MD, 1 tablet at 06/08/21 1637   norepinephrine (LEVOPHED) 4mg  in 257mL premix infusion, 2-10 mcg/min, Intravenous, Titrated, Georgette Shell, MD, Last Rate: 37.5 mL/hr at 06/10/21 0840, 10 mcg/min at 06/10/21 0840   ondansetron (ZOFRAN) tablet 4 mg, 4 mg, Oral, Q6H PRN **OR** ondansetron (ZOFRAN) injection 4 mg, 4 mg, Intravenous, Q6H PRN, Jonelle Sidle, Mohammad L, MD, 4 mg at 06/09/21 1834   pantoprazole (PROTONIX) injection 40 mg, 40 mg, Intravenous, Q12H, Georgette Shell, MD, 40 mg at 06/10/21 [facility] Events: Including procedures, antibiotic start and stop dates in addition to other pertinent events   06/07/2021 - admit 8/12- onset GI bleed 8/13 - agitation, intubation, massive gi bleed, shock  Interim History / Subjective:  x  Objective   Blood pressure (!) 107/34, pulse 88, temperature (!) 93.6 F (34.2 C), temperature source Axillary, resp. rate 18, height 5\' 10"  (1.778 m), weight 79.3 kg, SpO2 100 %.    Vent Mode: PRVC FiO2 (%):  [100 %] 100 % Set Rate:  [18 bmp] 18 bmp Vt Set:  [580 mL] 580 mL PEEP:  [5 cmH20] 5 cmH20 Plateau Pressure:  [19 cmH20] 19 cmH20   Intake/Output Summary (Last 24 hours) at 06/10/2021 1026 Last data filed at 06/10/2021 0500 Gross per 24 hour  Intake 1694.46  ml  Output 1150 ml  Net 544.46 ml   Filed Weights   06/07/21 1737 06/09/21 0430  Weight: 88 kg 79.3 kg    Examination: General: Pale male, on ventilator and restless HENT: Endotracheal tube present.  Nipples equal and reactive to light. Lungs: Dyssynchronous with the ventilator.  Clear to auscultation bilaterally Cardiovascular: Normal heart sounds. Abdomen: Soft.  Scar of surgery present.  Along with ostomy bag. Extremities: Intact and pale.?  Slightly mottled Neuro: Restless and agitated intermittently.  At one point he followed commands GU:  Normal-looking scrotum.  Having blood and melena through the rectum.    LABS    PULMONARY Recent Labs  Lab 06/10/21 0942  PHART 7.335*  PCO2ART 20.8*  PO2ART 366*  HCO3 10.8*  O2SAT 100.0    CBC Recent Labs  Lab 06/08/21 0426 06/09/21 0436 06/10/21 0806  HGB 9.7* 9.6* 4.0*  HCT 30.0* 30.9* 14.4*  WBC 11.1* 10.3 12.3*  PLT 260 304 233    COAGULATION Recent Labs  Lab 06/07/21 1752  INR 1.1   HsTROp 14 CARDIAC  No results for input(s): TROPONINI in the last 168 hours. No results for input(s): PROBNP in the last 168 hours.   CHEMISTRY Recent Labs  Lab 06/07/21 1752 06/07/21 1821 06/08/21 0426 06/09/21 0436 06/10/21 0809  NA 137  --  136 141 141  K 4.5  --  3.8 3.8 4.3  CL 103  --  104 107 113*  CO2 23  --  25 25 12*  GLUCOSE 184*  --  149* 136* 222*  BUN 36*  --  28* 22 36*  CREATININE 1.11  --  0.84 0.84 1.21  CALCIUM 9.6  --  8.5* 8.4* 6.9*  MG  --  1.7  --   --  1.7  PHOS  --   --   --   --  4.4   Estimated Creatinine Clearance: 48.6 mL/min (by C-G formula based on SCr of 1.21 mg/dL).   LIVER Recent Labs  Lab 06/07/21 1752 06/08/21 0426 06/09/21 0436 06/10/21 0809  AST $Re'30 27 23 27  'IlF$ ALT 44 35 34 22  ALKPHOS 99 78 75 43  BILITOT 0.7 0.2* 0.6 0.4  PROT 6.8 5.7* 5.7* 3.4*  ALBUMIN 2.7* 2.1* 2.2* 1.4*  INR 1.1  --   --   --      INFECTIOUS Recent Labs  Lab 06/07/21 1751 06/07/21 2020 06/10/21 0809  LATICACIDVEN 2.5* 1.4 >11.0*  PROCALCITON  --   --  <0.10     ENDOCRINE CBG (last 3)  Recent Labs    06/09/21 2200 06/10/21 0711 06/10/21 0837  GLUCAP 141* 131* 151*         IMAGING x48h  - image(s) personally visualized  -   highlighted in bold DG Chest 1 View  Result Date: 06/09/2021 CLINICAL DATA:  Tachycardia. Status post cystectomy 7/22. Failure to thrive. EXAM: CHEST  1 VIEW COMPARISON:  One-view chest x-ray 06/07/2021 FINDINGS: Heart size normal. Atherosclerotic changes are present at the aortic arch. Lung  volumes are low. No edema or effusion is present. No focal airspace disease is present. IMPRESSION: 1. Low lung volumes. 2. No acute cardiopulmonary disease. Electronically Signed   By: San Morelle M.D.   On: 06/09/2021 14:10     Resolved Hospital Problem list   x  Assessment & Plan:  ASSESSMENT / PLAN:  PULMONARY  A:  Baseline history of OSA on CPAP Neear respiratory arrest with resultant acute respiratory failure 06/27/2021  requiring intubation -due to encephalopathy   06/10/2021 -> currently on the ventilator  P:   PRVC VAP bundle   NEUROLOGIC A:   Acute agitated metabolic encephalopathy onset 06/09/2021 -in the setting of UTI and also GI bleed Sedation needs for the ventilator  P:   RASS sedation  goal score 0 to -2   VASCULAR A:   New onset circulatory shock 06/10/2021 secondary to hemorrhagic shock secondary to GI bleed  P:  PRBC for anemia section Mean arterial pressure goal greater than 65 Levophed and fluid boluses as needed to maintain blood pressure Place central line if necessary  CARDIAC STRUCTURAL A: History of coronary artery disease.  No echo in our records  P: Get echo Cycle enzymes  CARDIAC ELECTRICAL A: History of right bundle branch block and first-degree block at baseline Sinus with normal QTC on 06/10/2021   P: Telemetry monitoring  INFECTIOUS A:   Admission problem of Enterobacter cloacae UTI in the postop setting on 06/07/2021 P:   Ceftriaxone 06/07/2021 >>    RENAL A:  Baseline history bladder cancer which was muscle invasive upon TURBT with left-sided hydronephrosis upstaging it to T3 who is status post radical cystectomy and ileal conduit urinary diversion on 05/19/2021 with final pathology of pT1a.    Initial CT imaging on 06/07/2021 revealed mild right hydronephrosis and moderate left hydronephrosis.  Ureteral stents are adequately positioned.  He is making good urine function and his creatinine is stable.  Renal  ultrasound on hospital day 1 revealed resolved right hydronephrosis and mild left hydronephrosis.  This is stable compared to his prior imaging before his surgery.  P:  Monitor  METABOLIC  A Severe lactic acisdisis 06/10/21 due to GI bleed  Plan  - bicarb bolus - hydrate  - monitor   ELECTROLYTES A:  Mild hypomagnesemia present on admit 06/07/2021 and again present on 06/10/2021 P: Replete   GASTROINTESTINAL A:   GI bleed onset 06/09/2021 with conversion to massive GI bleed 06/10/2021 -upper  P:   NPO Per GI PPI BID OG tube -if blodd- EKG Otehrwise, CTA  HEMATOLOGIC   - HEME A:  Hemorrhagic anemia initial onset 06/09/2021 with conversion to massive hemorrhagic anemia 06/10/2021  GI started 2 units of PRBC 06/10/2021  P:  - serial - PRBC for hgb </= 6.9gm%    - exceptions are   -  if ACS susepcted/confirmed then transfuse for hgb </= 8.0gm%,  or    -  active bleeding with hemodynamic instability, then transfuse regardless of hemoglobin value   At at all times try to transfuse 1 unit prbc as possible with exception of active hemorrhage   HEMATOLOGIC - Platelets A At risk thrombocytopenia  P Scd (no lovenox /heparin due to GIB)  ENDOCRINE A:   Diabetes HgbA1C 6.5    P:   ssi  MSK/DERM Baseline failure to thrive since 05/19/21 surgery - prior to admit    Best Practice (right click and "Reselect all SmartList Selections" daily)   Diet/type: NPO DVT prophylaxis: SCD GI prophylaxis: PPI Lines: N/A Foley:  N/A Code Status:  full code Last date of multidisciplinary goals of care discussion [son at bedisde]     [place]   The patient Dorien Mayotte. is critically ill with multiple organ systems failure and requires high complexity decision making for assessment and support, frequent evaluation and titration of therapies, application of advanced monitoring technologies and extensive interpretation of multiple databases.   Critical  Care Time devoted  to patient care services described in this note is  90  Minutes. This time reflects time of care of this signee Dr Brand Males. This critical care time does not reflect procedure time, or teaching time or supervisory time of PA/NP/Med student/Med Resident etc but could involve care discussion time     Dr. Brand Males, M.D., [facility]C.P Pulmonary and Critical Care Medicine Staff Physician [place] Pager: [phone], If no answer or between  15:00h - 7:00h: call [phone]  06/10/2021 10:26 AM    LABS    PULMONARY Recent Labs  Lab 06/10/21 0942  PHART 7.335*  PCO2ART 20.8*  PO2ART 366*  HCO3 10.8*  O2SAT 100.0    CBC Recent Labs  Lab 06/08/21 0426 06/09/21 0436 06/10/21 0806  HGB 9.7* 9.6* 4.0*  HCT 30.0* 30.9* 14.4*  WBC 11.1* 10.3 12.3*  PLT 260 304 233    COAGULATION Recent Labs  Lab 06/07/21 1752  INR 1.1    CARDIAC  No results for input(s): TROPONINI in the last 168 hours. No results for input(s): PROBNP in the last 168 hours.   CHEMISTRY Recent Labs  Lab 06/07/21 1752 06/07/21 1821 06/08/21 0426 06/09/21 0436 06/10/21 0809  NA 137  --  136 141 141  K 4.5  --  3.8 3.8 4.3  CL 103  --  104 107 113*  CO2 23  --  25 25 12*  GLUCOSE 184*  --  149* 136* 222*  BUN 36*  --  28* 22 36*  CREATININE 1.11  --  0.84 0.84 1.21  CALCIUM 9.6  --  8.5* 8.4* 6.9*  MG  --  1.7  --   --  1.7  PHOS  --   --   --   --  4.4   Estimated Creatinine Clearance: 48.6 mL/min (by C-G formula based on SCr of 1.21 mg/dL).   LIVER Recent Labs  Lab 06/07/21 1752 06/08/21 0426 06/09/21 0436 06/10/21 0809  AST $Re'30 27 23 27  'DJq$ ALT 44 35 34 22  ALKPHOS 99 78 75 43  BILITOT 0.7 0.2* 0.6 0.4  PROT 6.8 5.7* 5.7* 3.4*  ALBUMIN 2.7* 2.1* 2.2* 1.4*  INR 1.1  --   --   --      INFECTIOUS Recent Labs  Lab 06/07/21 1751 06/07/21 2020 06/10/21 0809  LATICACIDVEN 2.5* 1.4 >11.0*  PROCALCITON   --   --  <0.10     ENDOCRINE CBG (last 3)  Recent Labs    06/09/21 2200 06/10/21 0711 06/10/21 0837  GLUCAP 141* 131* 151*         IMAGING x48h  - image(s) personally visualized  -   highlighted in bold DG Chest 1 View  Result Date: 06/09/2021 CLINICAL DATA:  Tachycardia. Status post cystectomy 7/22. Failure to thrive. EXAM: CHEST  1 VIEW COMPARISON:  One-view chest x-ray 06/07/2021 FINDINGS: Heart size normal. Atherosclerotic changes are present at the aortic arch. Lung volumes are low. No edema or effusion is present. No focal airspace disease is present. IMPRESSION: 1. Low lung volumes. 2. No acute cardiopulmonary disease. Electronically Signed   By: San Morelle M.D.   On: 06/09/2021 14:10

## 2021-06-10 NOTE — Progress Notes (Signed)
Brief Nutrition Note  Pt intubated. Consult received for enteral/tube feeding initiation and management.  Adult Enteral Nutrition Protocol initiated. Full assessment to follow.  Admitting Dx: Dehydration [E86.0] Hypovolemia [E86.1] SIRS (systemic inflammatory response syndrome) (HCC) [R65.10] Urinary tract infection without hematuria, site unspecified [N39.0]  Labs:  Recent Labs  Lab 06/07/21 1821 06/08/21 0426 06/09/21 0436 06/10/21 0809  NA  --  136 141 141  K  --  3.8 3.8 4.3  CL  --  104 107 113*  CO2  --  25 25 12*  BUN  --  28* 22 36*  CREATININE  --  0.84 0.84 1.21  CALCIUM  --  8.5* 8.4* 6.9*  MG 1.7  --   --  1.7  PHOS  --   --   --  4.4  GLUCOSE  --  149* 136* 222*    Corrin Parker, MS, RD, LDN RD pager number/after hours weekend pager number on Amion.

## 2021-06-10 NOTE — Progress Notes (Signed)
Patient was transferred to ICU this morning intubated.  He became unresponsive pale with agonal breathing and severe hypotension with SBP in the 60s heart rates in the 50s.  Patient was restless prior to this event trying to climb out of bed.  He was started on wide-open normal saline followed by dopamine.  Patient was intubated by CRNA.  Stat labs and GI consult was ordered signed out to Dr. Chase Caller who has accepted the patient. I discussed in detail with patient's family his sons and his wife.

## 2021-06-10 NOTE — Consult Note (Addendum)
Urology Consult Note   Requesting Attending Physician:  Georgette Shell, MD Service Providing Consult: Urology  Consulting Attending: Dr. Jacki Cones   Reason for Consult: UTI, failure to thrive, hydronephrosis  HPI: [name]. is seen in consultation for reasons noted above at the request of Georgette Shell, MD for evaluation of a patient who is underwent a cystectomy about 3 weeks ago who has been readmitted for UTI, failure to thrive.  80 y.o. male with history of bladder cancer which was muscle invasive upon TURBT with left-sided hydronephrosis upstaging it to T3 who is status post radical cystectomy and ileal conduit urinary diversion on 05/19/2021 with final pathology of pT1a.  He was discharged 6 days postoperatively after a relatively uncomplicated hospital course after return of bowel function was achieved.  He presents to the emergency room with failure to thrive at home.  Found to have a white blood cell count of 15, hypotensive.  He was afebrile and his creatinine was within normal limits.  He was just dated adequately.  Urine culture has revealed gram-negative rods.  Sensitivities and susceptibilities and speciation are pending.  Initial CT imaging on 06/07/2021 revealed mild right hydronephrosis and moderate left hydronephrosis.  Ureteral stents are adequately positioned.  He is making good urine function and his creatinine is stable.  Renal ultrasound on hospital day 1 revealed resolved right hydronephrosis and mild left hydronephrosis.  This is stable compared to his prior imaging before his surgery.  He is having bowel movements.  Poor appetite at home.  He says all food tastes terrible.  During his hospitalization in late July his weight was 194 pounds.  During this hospitalization he is 20 pounds lighter at about 174 pounds.   Past Medical History: Past Medical History:  Diagnosis Date   Anginal pain ([place]) 1997   Coronary artery disease    First  degree AV block 01/16/2021   Noted on EKG   History of kidney stones    Hypercholesteremia    Hypertension    Neuromuscular disorder ([place])    peripheral neuropathy   OSA (obstructive sleep apnea) 08/05/2013   Pre-diabetes    Right bundle branch block (RBBB) 01/16/2021   Noted on EKG   Sleep apnea    Tourette syndrome    Tourette's syndrome 04/01/2013    Past Surgical History:  Past Surgical History:  Procedure Laterality Date   [place]   CATARACT EXTRACTION  11/2012   CYSTOSCOPY WITH INJECTION N/A 05/19/2021   Procedure: CYSTOSCOPY WITH INJECTION OF INDOCYANINE GREEN DYE;  Surgeon: Alexis Frock, MD;  Location: [facility];  Service: Urology;  Laterality: N/A;   heart stent  01/16/1996   LYMPH NODE DISSECTION Bilateral 05/19/2021   Procedure: LYMPH NODE DISSECTION;  Surgeon: Alexis Frock, MD;  Location: [facility];  Service: Urology;  Laterality: Bilateral;   ROBOT ASSISTED LAPAROSCOPIC COMPLETE CYSTECT ILEAL CONDUIT N/A 05/19/2021   Procedure: XI ROBOTIC ASSISTED LAPAROSCOPIC COMPLETE CYSTECT ILEAL CONDUIT;  Surgeon: Alexis Frock, MD;  Location: [facility];  Service: Urology;  Laterality: N/A;   ROBOT ASSISTED LAPAROSCOPIC RADICAL PROSTATECTOMY N/A 05/19/2021   Procedure: XI ROBOTIC ASSISTED LAPAROSCOPIC RADICAL PROSTATECTOMY;  Surgeon: Alexis Frock, MD;  Location: [facility];  Service: Urology;  Laterality: N/A;   TRANSURETHRAL RESECTION OF BLADDER TUMOR Bilateral 01/17/2021   Procedure: TRANSURETHRAL RESECTION OF BLADDER TUMOR (TURBT);  Surgeon: Irine Seal, MD;  Location: [facility];  Service: Urology;  Laterality: Bilateral;  GENERAL ANESTHESIA WITH  PARLYSIS   TRANSURETHRAL RESECTION OF BLADDER TUMOR Left 02/03/2021   Procedure: TRANSURETHRAL RESECTION OF BLADDER TUMOR (TURBT);  Surgeon: Irine Seal, MD;  Location: [facility];  Service: Urology;  Laterality: Left;  WITH PARALYPIC    Medication: Current Facility-Administered Medications  Medication Dose  Route Frequency Provider Last Rate Last Admin   0.9 %  sodium chloride infusion   Intravenous Continuous Georgette Shell, MD 100 mL/hr at 06/09/21 1925 New Bag at 06/09/21 1925   acetaminophen (TYLENOL) tablet 650 mg  650 mg Oral Q6H PRN Elwyn Reach, MD   650 mg at 06/09/21 2216   Or   acetaminophen (TYLENOL) suppository 650 mg  650 mg Rectal Q6H PRN Elwyn Reach, MD       cefTRIAXone (ROCEPHIN) 1 g in sodium chloride 0.9 % 100 mL IVPB  1 g Intravenous Q24H Gala Romney L, MD 200 mL/hr at 06/09/21 2359 1 g at 06/09/21 2359   enoxaparin (LOVENOX) injection 40 mg  40 mg Subcutaneous Q24H Gala Romney L, MD   40 mg at 06/08/21 [id number]   insulin aspart (novoLOG) injection 0-15 Units  0-15 Units Subcutaneous TID WC Gala Romney L, MD   2 Units at 06/09/21 1650   insulin aspart (novoLOG) injection 0-5 Units  0-5 Units Subcutaneous QHS Gala Romney L, MD       LORazepam (ATIVAN) tablet 0.5 mg  0.5 mg Oral QHS Gala Romney L, MD   0.5 mg at 06/09/21 2217   mirtazapine (REMERON) tablet 7.5 mg  7.5 mg Oral QHS Georgette Shell, MD   7.5 mg at 06/09/21 2216   multivitamin with minerals tablet 1 tablet  1 tablet Oral Daily Elwyn Reach, MD   1 tablet at 06/08/21 1637   ondansetron (ZOFRAN) tablet 4 mg  4 mg Oral Q6H PRN Elwyn Reach, MD       Or   ondansetron (ZOFRAN) injection 4 mg  4 mg Intravenous Q6H PRN Elwyn Reach, MD   4 mg at 06/09/21 1834   pantoprazole (PROTONIX) injection 40 mg  40 mg Intravenous Q12H Georgette Shell, MD   40 mg at 06/09/21 2217    Allergies: No Known Allergies  Social History: Social History   Tobacco Use   Smoking status: Former    Types: Cigarettes    Quit date: 05/18/1996    Years since quitting: 25.0   Smokeless tobacco: Never   Tobacco comments:    Quit over 20 years ago  Vaping Use   Vaping Use: Never used  Substance Use Topics   Alcohol use: No    Alcohol/week: 0.0 standard drinks   Drug use: No     Family History Family History  Problem Relation Age of Onset   Pancreatic cancer Mother    Heart disease Father     Review of Systems 10 systems were reviewed and are negative except as noted specifically in the HPI.  Objective   Vital signs in last 24 hours: BP (!) 93/58 (BP Location: Left Arm)   Pulse 82   Temp 98.4 F (36.9 C)   Resp 16   Ht '5\' 10"'$  (1.778 m)   Wt 79.3 kg   SpO2 100%   BMI 25.08 kg/m   Physical Exam General: NAD, A&O, resting, appropriate HEENT: [place]/AT, EOMI, MMM Pulmonary: Normal work of breathing Cardiovascular: HDS, adequate peripheral perfusion Abdomen: Soft, NTTP, nondistended, ileal conduit right upper quadrant appears pink and healthy with ureteral stents  in place.  Mild mucus around the ostomy.  Incisions are well-healed. GU: No CVA tenderness Extremities: warm and well perfused Neuro: Appropriate, no focal neurological deficits  Most Recent Labs: Lab Results  Component Value Date   WBC 10.3 06/09/2021   HGB 9.6 (L) 06/09/2021   HCT 30.9 (L) 06/09/2021   PLT 304 06/09/2021    Lab Results  Component Value Date   NA 141 06/09/2021   K 3.8 06/09/2021   CL 107 06/09/2021   CO2 25 06/09/2021   BUN 22 06/09/2021   CREATININE 0.84 06/09/2021   CALCIUM 8.4 (L) 06/09/2021   MG 1.7 06/07/2021    Lab Results  Component Value Date   INR 1.1 06/07/2021   APTT 30 06/07/2021     Urine Culture: '@[id number]'$ (laburin,org,r9620,r9621)@   IMAGING: DG Chest 1 View  Result Date: 06/09/2021 CLINICAL DATA:  Tachycardia. Status post cystectomy 7/22. Failure to thrive. EXAM: CHEST  1 VIEW COMPARISON:  One-view chest x-ray 06/07/2021 FINDINGS: Heart size normal. Atherosclerotic changes are present at the aortic arch. Lung volumes are low. No edema or effusion is present. No focal airspace disease is present. IMPRESSION: 1. Low lung volumes. 2. No acute cardiopulmonary disease. Electronically Signed   By: San Morelle M.D.   On:  06/09/2021 14:10   US RENAL  Result Date: 06/08/2021 CLINICAL DATA:  Elevated serum creatinine EXAM: RENAL / URINARY TRACT ULTRASOUND COMPLETE COMPARISON:  None. FINDINGS: Right Kidney: Renal measurements: 12.1 x 6.1 x 5.1 cm = volume: 197 mL. No hydronephrosis. There is a 5.4 x 3.9 x 3.1 cm cystic lesion in the lower pole of the right kidney. This appears simple. No nephrolithiasis visualized. Left Kidney: Renal measurements: 13.0 x 5.7 x 5.6 cm = volume: 215 mL. Moderate hydronephrosis. No cystic or solid lesion. No nephrolithiasis visualized. Bladder: Prior cystectomy Other: None. IMPRESSION: Moderate left-sided hydronephrosis. No right-sided hydronephrosis. 5.4 cm simple appearing right lower pole renal cyst. Electronically Signed   By: Maurine Simmering M.D.   On: 06/08/2021 13:49    ------  Assessment:  80 y.o. male with history of bladder cancer which was muscle invasive upon TURBT with left-sided hydronephrosis upstaging it to T3 who is status post radical cystectomy and ileal conduit urinary diversion on 05/19/2021 with final pathology of pT1a.  He was discharged 6 days postoperatively after a relatively uncomplicated hospital course after return of bowel function was achieved.  He presents to the emergency room with failure to thrive at home.  Found to have a white blood cell count of 15, hypotensive.  He was afebrile and his creatinine was within normal limits.  He was just dated adequately.  Urine culture has revealed gram-negative rods.  Sensitivities and susceptibilities and speciation are pending.  Initial CT imaging on 06/07/2021 revealed mild right hydronephrosis and moderate left hydronephrosis.  Ureteral stents are adequately positioned.  He is making good urine function and his creatinine is stable.  Renal ultrasound on hospital day 1 revealed resolved right hydronephrosis and mild left hydronephrosis.  This is stable compared to his prior imaging before his surgery.  In the setting of  normal creatinine, no flank pain, making adequate urine output, will treat him for UTI with antibiotics wean towards his culture data.  Recommend complicated UTI course.  While hospitalized, would recommend nutrition consult given patient's significant weight loss since surgery and failure to thrive at home.  Must optimize him and ensure adequate caloric intake prior to discharge given his age, comorbidities, frailty.  Would also  recommend a wound ostomy nurse consultation while he is hospitalized for continued ostomy care and teaching.   Recommendations: -No acute urologic intervention indicated -Continue ureteral stents within the urostomy bag to drainage -Agree with antibiotics for complicated UTI course tailored per his culture data -Recommend nutrition consultation and optimizing feeds to improve his caloric intake -Recommend wound ostomy nurse consultation for continued ostomy care and teaching   Thank you for this consult. Please contact the urology consult pager with any further questions/concerns.

## 2021-06-10 NOTE — Procedures (Signed)
Central Venous Catheter Insertion Procedure Note  [name]  [id number]  1941/04/16  Date:06/10/21  Time:11:35 AM   Provider Performing:Fionna Merriott   Procedure: Insertion of Non-tunneled Central Venous Catheter(36556) with US guidance [id number])   Indication(s) Medication administration and Difficult access  Consent Unable to obtain consent due to emergent nature of procedure.  Anesthesia On vent, sedated paralyzed. Massive GI bleed  Timeout Verified patient identification, verified procedure, site/side was marked, verified correct patient position, special equipment/implants available, medications/allergies/relevant history reviewed, required imaging and test results available.  Sterile Technique Maximal sterile technique including full sterile barrier drape, hand hygiene, sterile gown, sterile gloves, mask, hair covering, sterile ultrasound probe cover (if used).  Procedure Description Area of catheter insertion was cleaned with chlorhexidine and draped in sterile fashion.  With real-time ultrasound guidance a central venous catheter was placed into the left internal jugular vein. Nonpulsatile blood flow and easy flushing noted in all ports.  The catheter was sutured in place and sterile dressing applied.  Complications/Tolerance None; patient tolerated the procedure well. Chest X-ray is ordered to verify placement for internal jugular or subclavian cannulation.   Chest x-ray is not ordered for femoral cannulation.  EBL 1cc  Specimen(s) None    SIGNATURE    Dr. Brand Males, M.D., F.C.C.P,  Pulmonary and Critical Care Medicine Staff Physician, [place] Director - Interstitial Lung Disease  Program  Pulmonary [place] at [place], [place], [address]  NPI Number:  NPI [id number]  Pager: [phone], If no answer  -> Check AMION or Try [place] Telephone (clinical office): [phone] Telephone (research): [phone]  11:36 AM 06/10/2021

## 2021-06-10 NOTE — Progress Notes (Signed)
In room for handoff with Bishnu, Night RN. Pt restless, trying to climb OOB. Pale, clammy, and speaking incomprehensible words. Bishnu stated he wasn't like this earlier. Blood sugar 131. Couldn't follow commands to grip or push with feet, and his gaze fixed (not tracking). Rapid Response called. MD paged.

## 2021-06-10 NOTE — Anesthesia Procedure Notes (Signed)
Procedure Name: Intubation Date/Time: 06/10/2021 7:49 AM Performed by: Montel Clock, CRNA Pre-anesthesia Checklist: Patient identified, Emergency Drugs available, Suction available, Patient being monitored and Timeout performed Oxygen Delivery Method: Ambu bag Preoxygenation: Pre-oxygenation with 100% oxygen Induction Type: IV induction Ventilation: Mask ventilation without difficulty Laryngoscope Size: Mac and 3 Grade View: Grade I Tube type: Oral Tube size: 8.0 mm Number of attempts: 1 Airway Equipment and Method: Stylet Placement Confirmation: ETT inserted through vocal cords under direct vision, positive ETCO2 and breath sounds checked- equal and bilateral Secured at: 23 cm Tube secured with: Tape Dental Injury: Teeth and Oropharynx as per pre-operative assessment

## 2021-06-10 NOTE — Interval H&P Note (Signed)
History and Physical Interval Note:  06/10/2021 12:02 PM  [name].  has presented today for surgery, with the diagnosis of GI bleed.  The various methods of treatment have been discussed with the patient and family. After consideration of risks, benefits and other options for treatment, the patient has consented to  Procedure(s): ESOPHAGOGASTRODUODENOSCOPY (EGD) WITH PROPOFOL (N/A) as a surgical intervention.  The patient's history has been reviewed, patient examined, no change in status, stable for surgery.  I have reviewed the patient's chart and labs.  Questions were answered to the patient's satisfaction.     Scarlette Shorts

## 2021-06-10 NOTE — Significant Event (Signed)
Rapid Response Event Note   Reason for Call : AMS, lethargic Notified by beside RN, Nevin Bloodgood, patient was having stroke like neurological symptoms. Patient have change in speech. Upon arrival patient was very pale. Mucous membranes and tongue were pale to off white in color. Patient had eyes slightly open but not able to follow commands. Patient appeared to be agonally breathing. Unable to get a oxygen saturation to pick via pulse oximetry. BP was hypotensive per bedside RN.   Initial Focused Assessment:  Neuro: not responding to pain, no purposeful movement, pupils, 2 reactive to light but sluggish bilaterally.  Cardiac: systolic BP was in the 0000000 when BP was obtained, HR 50s-60s sinus brady. Patient did have palpable femoral pulse.  Pulmonary: Breath sounds, clear and diminished in all lung fields, RR 6-8, O2 Sats not picking up.    Interventions:  Started Normal Saline bolus at 927m/hr Ordered 3 East to obtain code cart -Place defib pads on  Started Dopamine- titrated to 5cmg for hypotension per Rapid Response protocol  Respiratory called to beside to assist with airway and ventilation  Due to patient preceding toward respiratory arrest Medical Alert was called overhead -Patient did not lose pulses -CRNA intubated at beside, Code team also at bedside   Plan of Care:  Transferred to ICU immediately to 1223, BP still hypotensive but patient stable to transfer.    Event Summary:   MD Notified: Code team called, along with MD MRodena Pietypatient's [facility]  Call Time: [phone] Arrival Time: 0727  End Time: 0813  SLaurence Slate RN

## 2021-06-10 NOTE — Progress Notes (Signed)
Pt transferred to ICU following intubation, and dopamine gtt stabilized VS via bed. Accompanied by primary nurse, AC , RRT and RR.

## 2021-06-10 NOTE — Op Note (Signed)
[facility] Patient Name: [name] Procedure Date: 06/10/2021 MRN: [id number] Attending MD: Docia Chuck. Henrene Pastor , MD Date of Birth: 06/22/1941 CSN: [id number] Age: 80 Admit Type: Inpatient Procedure:                Upper GI endoscopy with control of bleeding; with                            biopsies Indications:              Melena and hemorrhagic shock Providers:                Docia Chuck. Henrene Pastor, MD, Brooke Person, Tyna Jaksch                            Technician Referring MD:             [facility] Medicines:                Monitored Anesthesia Care. Now intubated in ICU Complications:            No immediate complications. Estimated Blood Loss:     Estimated blood loss: none. Procedure:                Pre-Anesthesia Assessment:                           - Prior to the procedure, a History and Physical                            was performed, and patient medications and                            allergies were reviewed. The patient's tolerance of                            previous anesthesia was also reviewed. The risks                            and benefits of the procedure and the sedation                            options and risks were discussed with the patient.                            All questions were answered, and informed consent                            was obtained. Prior Anticoagulants: The patient has                            taken no previous anticoagulant or antiplatelet                            agents. ASA Grade Assessment: IV - A patient with  severe systemic disease that is a constant threat                            to life. After reviewing the risks and benefits,                            the patient was deemed in satisfactory condition to                            undergo the procedure.                           After obtaining informed consent, the endoscope was                            passed  under direct vision. Throughout the                            procedure, the patient's blood pressure, pulse, and                            oxygen saturations were monitored continuously. The                            [id number] [id number]) Olympus therapeutic endoscope                            was introduced through the mouth, and advanced to                            the second part of duodenum. The [id number]                            Olympus endoscope was introduced through the mouth,                            and advanced to the second part of duodenum. The                            upper GI endoscopy was accomplished without                            difficulty. The patient tolerated the procedure                            well. Scope In: Scope Out: Findings:      The esophagus was normal.      The stomach was normal. Small hiatal hernia. Biopsies were taken with a       cold forceps for histology to rule out Helicobacter pylori.      One oozing cratered duodenal ulcer with a visible vessel was found in       the duodenal bulb. The lesion was 25 mm in largest dimension. Area was       successfully injected with 3  mL of a 1:10,000 solution of epinephrine       for hemostasis. Coagulation for hemostasis using bipolar probe was       successful. See images      The examined duodenum beyond the bulb was normal.      The cardia and gastric fundus were normal on retroflexion. Impression:               1. Giant duodenal bulb ulcer with visible vessel                            and bleeding status post successful endoscopic                            hemostatic therapy as described                           2. Otherwise unremarkable EGD. Moderate Sedation:      none Recommendation:           1. The patient will remain in the ICU                           2. Continue to monitor closely for evidence of                            rebleeding (vital signs, blood counts,  stool output)                           3. Continue IV PPI                           4. Transfuse as needed                           5. If patient were to rebleed I would recommend                            referral to interventional radiology for                            embolization therapy                           I have discussed all of the above in detail with                            multiple family members including the patient's                            wife.                           GI will continue to follow Procedure Code(s):        --- Professional ---                           [phone], Esophagogastroduodenoscopy, flexible,  transoral; with biopsy, single or multiple Diagnosis Code(s):        --- Professional ---                           K26.4, Chronic or unspecified duodenal ulcer with                            hemorrhage                           K92.1, Melena (includes Hematochezia) CPT copyright 2019 American Medical Association. All rights reserved. The codes documented in this report are preliminary and upon coder review may  be revised to meet current compliance requirements. Docia Chuck. Henrene Pastor, MD 06/10/2021 1:50:05 PM This report has been signed electronically. Number of Addenda: 0

## 2021-06-10 NOTE — Progress Notes (Signed)
Official code blue called. Pt being bagged, and [name] had started dopamine.

## 2021-06-10 NOTE — Progress Notes (Signed)
Has DU bleed. Sp egd  Plan C otninue cbc check Continue ppi Add pepcid NPO  Dc oral meds     SIGNATURE    Dr. Brand Males, M.D., F.C.C.P,  Pulmonary and Critical Care Medicine Staff Physician, [place] Director - Interstitial Lung Disease  Program  Pulmonary [place] at [place], [place], [address]  NPI Number:  NPI [id number]  Pager: [phone], If no answer  -> Check AMION or Try [phone] Telephone (clinical office): [phone] Telephone (research): [phone]  3:45 PM 06/10/2021

## 2021-06-11 ENCOUNTER — Inpatient Hospital Stay (HOSPITAL_COMMUNITY): Payer: Medicare HMO

## 2021-06-11 DIAGNOSIS — K260 Acute duodenal ulcer with hemorrhage: Secondary | ICD-10-CM

## 2021-06-11 DIAGNOSIS — I469 Cardiac arrest, cause unspecified: Secondary | ICD-10-CM

## 2021-06-11 DIAGNOSIS — R6510 Systemic inflammatory response syndrome (SIRS) of non-infectious origin without acute organ dysfunction: Secondary | ICD-10-CM | POA: Diagnosis not present

## 2021-06-11 DIAGNOSIS — D62 Acute posthemorrhagic anemia: Secondary | ICD-10-CM | POA: Diagnosis not present

## 2021-06-11 DIAGNOSIS — J9601 Acute respiratory failure with hypoxia: Secondary | ICD-10-CM | POA: Diagnosis not present

## 2021-06-11 LAB — CBC
HCT: 29.2 % — ABNORMAL LOW (ref 39.0–52.0)
Hemoglobin: 9.9 g/dL — ABNORMAL LOW (ref 13.0–17.0)
MCH: 29.3 pg (ref 26.0–34.0)
MCHC: 33.9 g/dL (ref 30.0–36.0)
MCV: 86.4 fL (ref 80.0–100.0)
Platelets: 187 10*3/uL (ref 150–400)
RBC: 3.38 MIL/uL — ABNORMAL LOW (ref 4.22–5.81)
RDW: 14.6 % (ref 11.5–15.5)
WBC: 19.1 10*3/uL — ABNORMAL HIGH (ref 4.0–10.5)
nRBC: 0.1 % (ref 0.0–0.2)

## 2021-06-11 LAB — PROCALCITONIN: Procalcitonin: 12.5 ng/mL

## 2021-06-11 LAB — COMPREHENSIVE METABOLIC PANEL
ALT: 42 U/L (ref 0–44)
AST: 40 U/L (ref 15–41)
Albumin: 1.8 g/dL — ABNORMAL LOW (ref 3.5–5.0)
Alkaline Phosphatase: 52 U/L (ref 38–126)
Anion gap: 7 (ref 5–15)
BUN: 41 mg/dL — ABNORMAL HIGH (ref 8–23)
CO2: 23 mmol/L (ref 22–32)
Calcium: 7.1 mg/dL — ABNORMAL LOW (ref 8.9–10.3)
Chloride: 113 mmol/L — ABNORMAL HIGH (ref 98–111)
Creatinine, Ser: 1.13 mg/dL (ref 0.61–1.24)
GFR, Estimated: >60 mL/min (ref >60)
Glucose, Bld: 82 mg/dL (ref 70–99)
Potassium: 3.4 mmol/L — ABNORMAL LOW (ref 3.5–5.1)
Sodium: 143 mmol/L (ref 135–145)
Total Bilirubin: 0.9 mg/dL (ref 0.3–1.2)
Total Protein: 4 g/dL — ABNORMAL LOW (ref 6.5–8.1)

## 2021-06-11 LAB — PHOSPHORUS: Phosphorus: 2.6 mg/dL (ref 2.5–4.6)

## 2021-06-11 LAB — HEMOGLOBIN AND HEMATOCRIT, BLOOD
HCT: 31.4 % — ABNORMAL LOW (ref 39.0–52.0)
HCT: 30.7 % — ABNORMAL LOW (ref 39.0–52.0)
HCT: 29.3 % — ABNORMAL LOW (ref 39.0–52.0)
Hemoglobin: 10.7 g/dL — ABNORMAL LOW (ref 13.0–17.0)
Hemoglobin: 10.3 g/dL — ABNORMAL LOW (ref 13.0–17.0)
Hemoglobin: 9.7 g/dL — ABNORMAL LOW (ref 13.0–17.0)

## 2021-06-11 LAB — LACTIC ACID, PLASMA: Lactic Acid, Venous: 1.2 mmol/L (ref 0.5–1.9)

## 2021-06-11 LAB — GLUCOSE, CAPILLARY
Glucose-Capillary: 110 mg/dL — ABNORMAL HIGH (ref 70–99)
Glucose-Capillary: 139 mg/dL — ABNORMAL HIGH (ref 70–99)
Glucose-Capillary: 139 mg/dL — ABNORMAL HIGH (ref 70–99)
Glucose-Capillary: 82 mg/dL (ref 70–99)
Glucose-Capillary: 84 mg/dL (ref 70–99)
Glucose-Capillary: 91 mg/dL (ref 70–99)
Glucose-Capillary: 93 mg/dL (ref 70–99)

## 2021-06-11 LAB — TROPONIN I (HIGH SENSITIVITY): Troponin I (High Sensitivity): 54 ng/L — ABNORMAL HIGH (ref <18)

## 2021-06-11 LAB — ECHOCARDIOGRAM COMPLETE
Height: 70 in
Weight: 2924.18 oz

## 2021-06-11 LAB — MAGNESIUM: Magnesium: 2.1 mg/dL (ref 1.7–2.4)

## 2021-06-11 MED ORDER — FENTANYL CITRATE (PF) 100 MCG/2ML IJ SOLN
12.5000 ug | INTRAMUSCULAR | Status: DC | PRN
  Administered 2021-06-12: 12.5 ug via INTRAVENOUS
  Filled 2021-06-11: qty 2

## 2021-06-11 MED ORDER — DEXTROSE IN LACTATED RINGERS 5 % IV SOLN: INTRAVENOUS | Status: DC

## 2021-06-11 MED ORDER — FUROSEMIDE 10 MG/ML IJ SOLN
20.0000 mg | Freq: Once | INTRAMUSCULAR | Status: AC
  Administered 2021-06-11: 20 mg via INTRAVENOUS
  Filled 2021-06-11: qty 2

## 2021-06-11 MED ORDER — PERFLUTREN LIPID MICROSPHERE
1.0000 mL | INTRAVENOUS | Status: AC | PRN
  Administered 2021-06-11: 2 mL via INTRAVENOUS
  Filled 2021-06-11: qty 10

## 2021-06-11 MED ORDER — POTASSIUM CHLORIDE 10 MEQ/50ML IV SOLN
10.0000 meq | INTRAVENOUS | Status: AC
  Administered 2021-06-11 (×2): 10 meq via INTRAVENOUS
  Filled 2021-06-11 (×2): qty 50

## 2021-06-11 MED ORDER — POTASSIUM CHLORIDE 10 MEQ/50ML IV SOLN
10.0000 meq | INTRAVENOUS | Status: AC
  Administered 2021-06-11 (×4): 10 meq via INTRAVENOUS
  Filled 2021-06-11 (×4): qty 50

## 2021-06-11 MED ORDER — POTASSIUM CHLORIDE 20 MEQ PO PACK
20.0000 meq | PACK | Freq: Once | ORAL | Status: DC
Start: 2021-06-11 — End: 2021-06-11

## 2021-06-11 MED ORDER — ORAL CARE MOUTH RINSE
15.0000 mL | Freq: Two times a day (BID) | OROMUCOSAL | Status: DC
  Administered 2021-06-11 – 2021-06-19 (×17): 15 mL via OROMUCOSAL

## 2021-06-11 MED FILL — Medication: Qty: 1 | Status: AC

## 2021-06-11 NOTE — Progress Notes (Signed)
[facility] Progress Note Patient Name: [name]. DOB: 09/16/1941 MRN: [id number]   Date of Service  06/11/2021  HPI/Events of Note  Notified of arrhythmia.  Pt had irregular rhythm on the monitor.   EKG dose showed sinus rhythm with PACs. RBBB, inferior wall infarct. Rate 88.  eICU Interventions  Continue to monitor for now.     Intervention Category Intermediate Interventions: Arrhythmia - evaluation and management  Elsie Lincoln 06/11/2021, 9:44 PM

## 2021-06-11 NOTE — Consult Note (Addendum)
NAME:  [name]., MRN:  [id number], DOB:  [birth year], LOS: 4 ADMISSION DATE:  06/07/2021, CONSULTATION DATE:  06/10/21 REFERRING MD:  Dr Kathreen Cosier of [facility], CHIEF COMPLAINT:  Near respiratory arrest -> then Massive Gi bleed post intubation   BRIEF  80 year old with medical history significant for bladder cancer status post cystectomy, prostatectomy and ileal conduit surgery on 05/19/2021, coronary artery disease, hyperlipidemia, essential hypertension, OSA on CPAP, Tourette's syndrome.  Prior to his urologic surgery on 05/19/2021 he was driving and had completely normal activities of daily living.  Post surgery has had failure to thrive, poor oral intake.Labs at admit with hgb 11, Bun 36/creat normal, Lactcic acid 2.5.  His admission CT abdomen pelvis showed interval prostatectomy and cystectomy with right lower quadrant urostomy and mild right and moderate left hydronephrosis with stents in place [deemed to be baseline per urology according to the hospitalist].  Got admitted 06/07/2021 for treatment of UTI with cultures 06/07/2021 growing cefazolin resistant greater than 100 K Enterobacter cloacae  Overall he was progressing well but on 06/09/2021 his hemoglobin was found to be 9.6 g/dL along with melenic stools and heme positive stools.  He reported history of abdominal pain prior to admission but no NSAID use.  Evaluated by GI and scheduled for EGD 06/10/2021  However overnight early hours of 06/10/2021 noticed to be agitated and confused with subsequent respiratory depression and emergently intubated approximately 7:45 AM 06/10/2021.  Never lost pulse post intubation had ongoing circulatory shock requiring pressors through peripheral IV.  Noticed to have significant drop in hemoglobin to 4 g% and significant worsening/onset of lactic acidosis [11] and subsequently in the ICU melena and bright blood per rectum.  CCM taking over primary service  Pertinent  Medical History    has a past medical  history of Anginal pain ([place]) (1997), Coronary artery disease, First degree AV block (01/16/2021), History of kidney stones, Hypercholesteremia, Hypertension, Neuromuscular disorder ([place]), OSA (obstructive sleep apnea) (08/05/2013), Pre-diabetes, Right bundle branch block (RBBB) (01/16/2021), Sleep apnea, Tourette syndrome, and Tourette's syndrome (04/01/2013).  has a past surgical history that includes Cataract extraction (11/2012); heart stent (01/16/1996); Cardiac catheterization (1997); Back surgery (1983); Transurethral resection of bladder tumor (Bilateral, 01/17/2021); Transurethral resection of bladder tumor (Left, 02/03/2021); Robot assisted laparoscopic complete cystect ileal conduit (N/A, 05/19/2021); Robot assisted laparoscopic radical prostatectomy (N/A, 05/19/2021); Lymph node dissection (Bilateral, 05/19/2021); and Cystoscopy with injection (N/A, 05/19/2021).   Significant Hospital Events: Including procedures, antibiotic start and stop dates in addition to other pertinent events   06/07/2021 - admit 8/12- onset GI bleed 8/13 - agitation, intubation, massive gi bleed, shock -> scope DU ucler and s;p epi (on ppi gtt and pepcid). S/p 4 U  PRBC  Interim History / Subjective:   8/14 - on 40% fio2, doing SBT, Not on pressors. No active bleeding. On prn sedation. Tourette +.   Objective   Blood pressure (!) 116/48, pulse 89, temperature 98 F (36.7 C), temperature source Axillary, resp. rate 18, height '5\' 10"'$  (1.778 m), weight 82.9 kg, SpO2 100 %.    Vent Mode: PSV FiO2 (%):  [40 %-70 %] 40 % Set Rate:  [18 bmp] 18 bmp Vt Set:  [580 mL] 580 mL PEEP:  [5 cmH20] 5 cmH20 Pressure Support:  [5 cmH20] 5 cmH20 Plateau Pressure:  [11 cmH20-15 cmH20] 14 cmH20   Intake/Output Summary (Last 24 hours) at 06/11/2021 0939 Last data filed at 06/11/2021 0626 Gross per 24 hour  Intake 7685.05 ml  Output [id number]  ml  Net 5860.05 ml   Filed Weights   06/09/21 0430 06/10/21 1400 06/11/21 0500  Weight: 79.3 kg  88.7 kg 82.9 kg    Examination: General Appearance:  Looks bette Head:  Normocephalic, without obvious abnormality, atraumatic Eyes:  PERRL - yes, conjunctiva/corneas - muddy     Ears:  Normal external ear canals, both ears Nose:  G tube - no Throat:  ETT TUBE - yes , OG tube - yes Neck:  Supple,  No enlargement/tenderness/nodules Lungs: Clear to auscultation bilaterally, Ventilator   Synchrony - doing PSV Heart:  S1 and S2 normal, no murmur, CVP - no.  Pressors - noi Abdomen:  Soft, no masses, no organomegaly Genitalia / Rectal:  Not done Extremities:  Extremities- intact Skin:  ntact in exposed areas . Sacral area - not examined Neurologic:  Sedation - prn -> RASS - +2 . Moves all 4s - yes. CAM-ICU - neg . Orientation - x3. [facility] Problem list   x  Assessment & Plan:    A:  Baseline history of OSA on CPAP Neear respiratory arrest with resultant acute respiratory failure 06/27/2021 requiring intubation -due to encephalopathy   06/11/2021 - > does yes meet criteria for Extubation in setting of Acute Respiratory Failure due to encepphloapthy and DU ulcer massive bleed  hemorrhagic shock. Now both resolved   P:   Extubate (will give 1 x '20mg'$  lasix)    A:   Baseline prior to and aat admit: Tourette + ? Baseline benzo lorazepam use Acute agitated metabolic encephalopathy onset 06/09/2021 -in the setting of UTI and also GI bleed  06/11/2021 - Oriented. But jerky ? Due to Tourette  P:   Monitor  Precedex gtt if needed Benzo prn    A:   New onset circulatory shock 06/10/2021 secondary to hemorrhagic shock secondary to GI bleed/ DU bleed  06/11/2021 - off pressors after 4U PRBC and DU cauterization  P:  PRBC for anemia section Mean arterial pressure goal greater than 65 - dc levophed off MAR CVL in place since 06/10/21 - dc next 24h if stable   A: History of coronary artery disease.  No echo in our records  06/11/2021 - pk hs Trop  54  P: Await echo results   A: History of right bundle branch block and first-degree block at baseline Sinus with normal QTC on 06/10/2021  06/11/2021- in sinus no issues  P: Telemetry monitoring - can be dc;ed upon trnsfer   A:   Admission problem of Enterobacter cloacae UTI in the postop setting on 06/07/2021 P:   Cefepemine 8/13  >> Ceftriaxone 8/10 - 8/13    A:  Baseline prior to and admit:  -  bladder cancer which was muscle invasive upon TURBT with left-sided hydronephrosis upstaging it to T3 who is status post radical cystectomy and ileal conduit urinary diversion on 05/19/2021 with final pathology of pT1a.    Initial CT imaging on 06/07/2021 revealed mild right hydronephrosis and moderate left hydronephrosis.  Ureteral stents are adequately positioned.  He is making good urine function and his creatinine is stable.  Renal ultrasound on hospital day 1 revealed resolved right hydronephrosis and mild left hydronephrosis.  This is stable compared to his prior imaging before his surgery.  P:  Monitor    A Severe lactic acisdisis 06/10/21 due to GI bleed  8/14 - resolve  Plan - monitor    A:  Mild hypomagnesemia present on admit 06/07/2021  and again present on 06/10/2021  8/14  mild hypokalemia  P: Replete    A:   GI bleed onset 06/09/2021 with conversion to massive GI bleed 06/10/2021 -upper. DU unlcer s/p epi emergent scope 8/13 by Dr Henrene Pastor  P:   NPO PPI gtt Pepcid IV Await H Pyloorrii test Rest per GI If rebleeds -> ? CCCS involvement   A:  Severe Hemorrhagic anemia initial onset 06/09/2021 with conversion to massive hemorrhagic anemia 06/10/2021 / sp 4 U PRobc  06/11/2021 - active bleeding stopped. Slow drift down of hgb  P:  - serial cbc - PRBC for hgb </= 6.9gm%    - exceptions are   -  if ACS susepcted/confirmed then transfuse for hgb </= 8.0gm%,  or    -  active bleeding with hemodynamic instability, then transfuse regardless of hemoglobin  value   At at all times try to transfuse 1 unit prbc as possible with exception of active hemorrhage    A At risk thrombocytopenia  P Scd (no lovenox /heparin due to GIB)   A:   Diabetes HgbA1C 6.5    P:   ssi  MSK/DERM Baseline failure to thrive since 05/19/21 surgery - prior to admit    Best Practice (right click and "Reselect all SmartList Selections" daily)   Diet/type: NPO DVT prophylaxis: SCD GI prophylaxis: PPI + H2 Lines: N/A Foley:  N/A Code Status:  full code Last date of multidisciplinary goals of care discussion   -son at bedisde 06/10/21 - on 8/14 called Rosaura Carpenter III [phone]  - udpated  Change status to d SDU through tomorrow 06/12/21. [facility] MD to pick up 06/12/21 and ccm off  -d/w Dr Nolberto Hanlon      ATTESTATION & SIGNATURE   The patient [name]. is critically ill with multiple organ systems failure and requires high complexity decision making for assessment and support, frequent evaluation and titration of therapies, application of advanced monitoring technologies and extensive interpretation of multiple databases.   Critical Care Time devoted to patient care services described in this note is  35  Minutes. This time reflects time of care of this signee Dr Brand Males. This critical care time does not reflect procedure time, or teaching time or supervisory time of PA/NP/Med student/Med Resident etc but could involve care discussion time     Dr. Brand Males, M.D., [facility].C.P Pulmonary and Critical Care Medicine Staff Physician [place] [facility] Pager: [phone], If no answer or between  15:00h - 7:00h: call [phone]  06/11/2021 10:16 AM    LABS    PULMONARY Recent Labs  Lab 06/10/21 0942  PHART 7.335*  PCO2ART 20.8*  PO2ART 366*  HCO3 10.8*  O2SAT 100.0    CBC Recent Labs  Lab 06/09/21 0436 06/10/21 0806 06/10/21 1519 06/10/21 2100 06/11/21 0331  HGB  9.6* 4.0* 11.1* 10.4* 9.9*  HCT 30.9* 14.4* 32.4* 30.1* 29.2*  WBC 10.3 12.3*  --   --  19.1*  PLT 304 233  --   --  187    COAGULATION Recent Labs  Lab 06/07/21 1752 06/10/21 1519  INR 1.1 1.5*    CARDIAC  No results for input(s): TROPONINI in the last 168 hours. No results for input(s): PROBNP in the last 168 hours.   CHEMISTRY Recent Labs  Lab 06/07/21 1752 06/07/21 1821 06/08/21 0426 06/09/21 0436 06/10/21 0809 06/11/21 0329  NA 137  --  136 141 141 143  K 4.5  --  3.8 3.8 4.3 3.4*  CL 103  --  104 107 113* 113*  CO2 23  --  25 25 12* 23  GLUCOSE 184*  --  149* 136* 222* 82  BUN 36*  --  28* 22 36* 41*  CREATININE 1.11  --  0.84 0.84 1.21 1.13  CALCIUM 9.6  --  8.5* 8.4* 6.9* 7.1*  MG  --  1.7  --   --  1.7 2.1  PHOS  --   --   --   --  4.4 2.6   Estimated Creatinine Clearance: 52 mL/min (by C-G formula based on SCr of 1.13 mg/dL).   LIVER Recent Labs  Lab 06/07/21 1752 06/08/21 0426 06/09/21 0436 06/10/21 0809 06/10/21 1519 06/11/21 0329  AST '30 27 23 27  '$ --  40  ALT 44 35 34 22  --  42  ALKPHOS 99 78 75 43  --  52  BILITOT 0.7 0.2* 0.6 0.4  --  0.9  PROT 6.8 5.7* 5.7* 3.4*  --  4.0*  ALBUMIN 2.7* 2.1* 2.2* 1.4*  --  1.8*  INR 1.1  --   --   --  1.5*  --      INFECTIOUS Recent Labs  Lab 06/10/21 0809 06/10/21 1518 06/11/21 0327 06/11/21 0329  LATICACIDVEN >11.0* 2.8* 1.2  --   PROCALCITON <0.10  --   --  12.50     ENDOCRINE CBG (last 3)  Recent Labs    06/11/21 0033 06/11/21 0355 06/11/21 0725  GLUCAP 93 84 82         IMAGING x48h  - image(s) personally visualized  -   highlighted in bold DG Chest 1 View  Result Date: 06/09/2021 CLINICAL DATA:  Tachycardia. Status post cystectomy 7/22. Failure to thrive. EXAM: CHEST  1 VIEW COMPARISON:  One-view chest x-ray 06/07/2021 FINDINGS: Heart size normal. Atherosclerotic changes are present at the aortic arch. Lung volumes are low. No edema or effusion is present. No focal  airspace disease is present. IMPRESSION: 1. Low lung volumes. 2. No acute cardiopulmonary disease. Electronically Signed   By: San Morelle M.D.   On: 06/09/2021 14:10   DG Abd 1 View  Result Date: 06/10/2021 CLINICAL DATA:  Orogastric tube placement EXAM: ABDOMEN - 1 VIEW COMPARISON:  None. FINDINGS: Gastric tube extends into the decompressed stomach. Bilateral nephroureteral catheters are noted. Visualized bowel gas pattern unremarkable. Multilevel spondylitic changes in the lumbar spine. IMPRESSION: Gastric tube into decompressed stomach. Electronically Signed   By: Lucrezia Europe M.D.   On: 06/10/2021 12:22   DG CHEST PORT 1 VIEW  Result Date: 06/10/2021 CLINICAL DATA:  ETT and central line placement EXAM: PORTABLE CHEST - 1 VIEW COMPARISON:  the previous day's study FINDINGS: Endotracheal tube tip approximately 5.2 cm above carina. Nasogastric tube into the decompressed stomach. Left IJ central line is directed towards the lateral wall of the proximal SVC. No pneumothorax. Lungs are clear. Heart size and mediastinal contours are within normal limits. Aortic Atherosclerosis (ICD10-170.0). No effusion. Visualized bones unremarkable. IMPRESSION: 1. Support hardware placement as above.  No pneumothorax. Electronically Signed   By: Lucrezia Europe M.D.   On: 06/10/2021 12:21

## 2021-06-11 NOTE — Progress Notes (Addendum)
Urology Progress Note   80 y.o. male with history of bladder cancer which was muscle invasive upon TURBT with left-sided hydronephrosis upstaging it to T3 who is status post radical cystectomy and ileal conduit urinary diversion on 05/19/2021 with final pathology of pT1a.  He was discharged 6 days postoperatively after a relatively uncomplicated hospital course after return of bowel function was achieved.  He presents to the emergency room with failure to thrive at home.  Found to have a white blood cell count of 15, hypotensive.  He was afebrile and his creatinine was within normal limits.  He was just dated adequately.  Urine culture has revealed gram-negative rods.  Sensitivities and susceptibilities and speciation are pending.   Initial CT imaging on 06/07/2021 revealed mild right hydronephrosis and moderate left hydronephrosis.  Ureteral stents are adequately positioned.  He is making good urine function and his creatinine is stable.  Renal ultrasound on hospital day 1 revealed resolved right hydronephrosis and mild left hydronephrosis.  This is stable compared to his prior imaging before his surgery.  06/10/21: Acute Hb dorp to 4.0 with BRBPR. Resuscitated and intubated and started on pressors and transferred to ICU. S/p EGD with GI at bedside revealing bleeding duodenal ulcer. Fulgurated.   Subjective: Remains intubated and sedated this morning. Hb responded to resuscitation.  Echo at the bedside revealed stable ejection fraction this morning.  Urine remains clear yellow.  Growing Enterobacter cloacae from urine culture.  Vasopressors have been weaned off.  Objective: Vital signs in last 24 hours: Temp:  [93.6 F (34.2 C)-98.2 F (36.8 C)] 98 F (36.7 C) (08/14 0800) Pulse Rate:  [73-125] 80 (08/14 0600) Resp:  [0-28] 18 (08/14 0600) BP: (37-184)/(13-118) 130/49 (08/14 0600) SpO2:  [87 %-100 %] 100 % (08/14 0750) FiO2 (%):  [40 %-70 %] 40 % (08/14 0750) Weight:  [82.9 kg-88.7 kg] 82.9 kg  (08/14 0500)  Intake/Output from previous day: 08/13 0701 - 08/14 0700 In: 7685.1 [I.V.:5002; Blood:1545.2; IV Piggyback:1137.9] Out: 1825 [Urine:1825] Intake/Output this shift: No intake/output data recorded.  Physical Exam:  General: Intubated CV: Regular rate Lungs: On the ventilator Abdomen: Abdomen is soft.  Ileostomy is pink and healthy-appearing with ureteral stents in place.  Clear yellow urine output. Ext: NT, No erythema  Lab Results: Recent Labs    06/10/21 1519 06/10/21 2100 06/11/21 0331  HGB 11.1* 10.4* 9.9*  HCT 32.4* 30.1* 29.2*    Recent Labs    06/10/21 0809 06/11/21 0329  NA 141 143  K 4.3 3.4*  CL 113* 113*  CO2 12* 23  GLUCOSE 222* 82  BUN 36* 41*  CREATININE 1.21 1.13  CALCIUM 6.9* 7.1*     Studies/Results: DG Chest 1 View  Result Date: 06/09/2021 CLINICAL DATA:  Tachycardia. Status post cystectomy 7/22. Failure to thrive. EXAM: CHEST  1 VIEW COMPARISON:  One-view chest x-ray 06/07/2021 FINDINGS: Heart size normal. Atherosclerotic changes are present at the aortic arch. Lung volumes are low. No edema or effusion is present. No focal airspace disease is present. IMPRESSION: 1. Low lung volumes. 2. No acute cardiopulmonary disease. Electronically Signed   By: San Morelle M.D.   On: 06/09/2021 14:10   DG Abd 1 View  Result Date: 06/10/2021 CLINICAL DATA:  Orogastric tube placement EXAM: ABDOMEN - 1 VIEW COMPARISON:  None. FINDINGS: Gastric tube extends into the decompressed stomach. Bilateral nephroureteral catheters are noted. Visualized bowel gas pattern unremarkable. Multilevel spondylitic changes in the lumbar spine. IMPRESSION: Gastric tube into decompressed stomach. Electronically Signed  By: Lucrezia Europe M.D.   On: 06/10/2021 12:22   DG CHEST PORT 1 VIEW  Result Date: 06/10/2021 CLINICAL DATA:  ETT and central line placement EXAM: PORTABLE CHEST - 1 VIEW COMPARISON:  the previous day's study FINDINGS: Endotracheal tube tip  approximately 5.2 cm above carina. Nasogastric tube into the decompressed stomach. Left IJ central line is directed towards the lateral wall of the proximal SVC. No pneumothorax. Lungs are clear. Heart size and mediastinal contours are within normal limits. Aortic Atherosclerosis (ICD10-170.0). No effusion. Visualized bones unremarkable. IMPRESSION: 1. Support hardware placement as above.  No pneumothorax. Electronically Signed   By: Lucrezia Europe M.D.   On: 06/10/2021 12:21    Assessment:   80 y.o. male with history of bladder cancer which was muscle invasive upon TURBT with left-sided hydronephrosis upstaging it to T3 who is status post radical cystectomy and ileal conduit urinary diversion on 05/19/2021 with final pathology of pT1a.  He was discharged 6 days postoperatively after a relatively uncomplicated hospital course after return of bowel function was achieved.  He presents to the emergency room with failure to thrive at home.  Found to have a white blood cell count of 15, hypotensive.  He was afebrile and his creatinine was within normal limits.  He was just dated adequately.  Urine culture has Enterobacter cloacae.  Acute drop in hemoglobin with bright red blood per rectum on 06/10/2021.  Status post ICU transfer, resuscitation with blood products and IV fluids, vasopressors, intubation.  Bedside endoscopy revealed bleeding duodenal ulcer which was fulgurated by the gastroenterology team.  Hemoglobin has responded.  Patient is now weaned off of vasopressors.  Assessment: History of bladder cancer status post cystoprostatectomy with ileal conduit Acute blood loss anemia with hemorrhagic shock secondary to upper GI bleed  Recommendations: -Agree with continued resuscitation by the primary intensive care team.  They are planning to extubate the patient today tentatively -Continue hemoglobin trending  -Continue ureteral stents within the urostomy bag to drainage -Agree with antibiotics for  complicated UTI course tailored per his culture data -Recommend wound ostomy nurse consultation for ostomy care -No urological intervention necessary at this time    LOS: 4 days

## 2021-06-11 NOTE — Progress Notes (Signed)
[facility] Progress Note Patient Name: [name]. DOB: October 08, 1941 MRN: [id number]   Date of Service  06/11/2021  HPI/Events of Note  Hgb 11.1>>10.4>>9.9 Hemodynamically stable with no reports of obvious significant bleeding  eICU Interventions  Continue to follow hgb     Intervention Category Intermediate Interventions: Bleeding - evaluation and treatment with blood products  Mauri [place], P 06/11/2021, 4:37 AM

## 2021-06-11 NOTE — Evaluation (Signed)
Clinical/Bedside Swallow Evaluation Patient Details  Name: [name]. MRN: [id number] Date of Birth: 1941-07-07  Today's Date: 06/11/2021 Time: SLP Start Time (ACUTE ONLY): 1450 SLP Stop Time (ACUTE ONLY): [id number] SLP Time Calculation (min) (ACUTE ONLY): 18 min  Past Medical History:  Past Medical History:  Diagnosis Date   Anginal pain ([place]) 1997   Coronary artery disease    First degree AV block 01/16/2021   Noted on EKG   History of kidney stones    Hypercholesteremia    Hypertension    Neuromuscular disorder ([place])    peripheral neuropathy   OSA (obstructive sleep apnea) 08/05/2013   Pre-diabetes    Right bundle branch block (RBBB) 01/16/2021   Noted on EKG   Sleep apnea    Tourette syndrome    Tourette's syndrome 04/01/2013   Past Surgical History:  Past Surgical History:  Procedure Laterality Date   [place]   CATARACT EXTRACTION  11/2012   CYSTOSCOPY WITH INJECTION N/A 05/19/2021   Procedure: CYSTOSCOPY WITH INJECTION OF INDOCYANINE GREEN DYE;  Surgeon: Alexis Frock, MD;  Location: [facility];  Service: Urology;  Laterality: N/A;   heart stent  01/16/1996   LYMPH NODE DISSECTION Bilateral 05/19/2021   Procedure: LYMPH NODE DISSECTION;  Surgeon: Alexis Frock, MD;  Location: [facility];  Service: Urology;  Laterality: Bilateral;   ROBOT ASSISTED LAPAROSCOPIC COMPLETE CYSTECT ILEAL CONDUIT N/A 05/19/2021   Procedure: XI ROBOTIC ASSISTED LAPAROSCOPIC COMPLETE CYSTECT ILEAL CONDUIT;  Surgeon: Alexis Frock, MD;  Location: [facility];  Service: Urology;  Laterality: N/A;   ROBOT ASSISTED LAPAROSCOPIC RADICAL PROSTATECTOMY N/A 05/19/2021   Procedure: XI ROBOTIC ASSISTED LAPAROSCOPIC RADICAL PROSTATECTOMY;  Surgeon: Alexis Frock, MD;  Location: [facility];  Service: Urology;  Laterality: N/A;   TRANSURETHRAL RESECTION OF BLADDER TUMOR Bilateral 01/17/2021   Procedure: TRANSURETHRAL RESECTION OF BLADDER TUMOR (TURBT);  Surgeon: Irine Seal, MD;   Location: [facility];  Service: Urology;  Laterality: Bilateral;  GENERAL ANESTHESIA WITH  PARLYSIS   TRANSURETHRAL RESECTION OF BLADDER TUMOR Left 02/03/2021   Procedure: TRANSURETHRAL RESECTION OF BLADDER TUMOR (TURBT);  Surgeon: Irine Seal, MD;  Location: [facility];  Service: Urology;  Laterality: Left;  WITH PARALYPIC   HPI:  80 year old with medical history significant for bladder cancer status post cystectomy, prostatectomy and ileal conduit surgery on 05/19/2021, coronary artery disease, hyperlipidemia, essential hypertension, OSA on CPAP, Tourette's syndrome.  Pt was intubated from 8/13-8/14.   Assessment / Plan / Recommendation Clinical Impression  Pt was seen for a bedside swallow evaluation following recent extubation.  Pt was encountered awake/alert with family present at bedside.  Pt and family reported that the pt does not have a hx of dysphagia, but that he does clear his throat and have excess saliva and phlegm that he expels at baseline.  Pt was seen with trials of thin liquid and regular solids.  Pt exhibited a delayed throat clear following 2/10 trials of thin liquid; however, difficult to determine if this was secondary to pharyngeal dysphagia or if it was baseline.  Pt was noted to clear his throat in the absence of PO throughout this session.  Mastication of regular solids was timely and pt cleared minimal oral residue with a liquid wash without difficulty.  Recommend initiation of regular solids and thin liquids with medication administered whole in puree.  SLP will briefly f/u to monitor diet tolerance.  SLP Visit Diagnosis: Dysphagia, unspecified (R13.10)    Aspiration Risk  Mild aspiration risk    Diet Recommendation Regular;Thin liquid   Liquid Administration via: Cup;Straw Medication Administration: Whole meds with puree Supervision: Patient able to self feed;Intermittent supervision to cue for compensatory strategies Compensations: Minimize environmental distractions;Slow  rate;Small sips/bites Postural Changes: Seated upright at 90 degrees    Other  Recommendations Oral Care Recommendations: Oral care BID Other Recommendations: Have oral suction available   Follow up Recommendations None      Frequency and Duration min 1 x/week  1 week       Prognosis        Swallow Study   General HPI: 80 year old with medical history significant for bladder cancer status post cystectomy, prostatectomy and ileal conduit surgery on 05/19/2021, coronary artery disease, hyperlipidemia, essential hypertension, OSA on CPAP, Tourette's syndrome.  Pt was intubated from 8/13-8/14. Type of Study: Bedside Swallow Evaluation Previous Swallow Assessment: N/A Diet Prior to this Study: NPO Temperature Spikes Noted: No Respiratory Status: Room air History of Recent Intubation: Yes Length of Intubations (days):  (<1) Date extubated: 06/11/21 Behavior/Cognition: Cooperative;Pleasant mood;Alert Oral Cavity Assessment: Within Functional Limits Oral Care Completed by SLP: No Oral Cavity - Dentition: Adequate natural dentition Vision: Functional for self-feeding Self-Feeding Abilities: Able to feed self;Needs set up Patient Positioning: Upright in chair Baseline Vocal Quality: Normal Volitional Cough: Strong Volitional Swallow: Able to elicit    Oral/Motor/Sensory Function Overall Oral Motor/Sensory Function: Within functional limits   Ice Chips Ice chips: Not tested   Thin Liquid Thin Liquid: Impaired Presentation: Straw Pharyngeal  Phase Impairments: Throat Clearing - Delayed    Nectar Thick Nectar Thick Liquid: Not tested   Honey Thick Honey Thick Liquid: Not tested   Puree Puree: Not tested   Solid     Solid: Within functional limits     Colin Mulders., M.S., CCC-SLP Acute Rehabilitation Services Office: [phone]  Elvia Collum Tippi Mccrae 06/11/2021,3:23 PM

## 2021-06-11 NOTE — Progress Notes (Signed)
Pt AM K+ 3.4 with creat 1.13 and GFR > 60. CCM ELink electrolyte protocol initiated.

## 2021-06-11 NOTE — Progress Notes (Addendum)
Patient ID: [name]., male   DOB: 10-13-1941, 80 y.o.   MRN: [id number]     Attending physician's note   I have taken an interval history, reviewed the chart and examined the patient. I agree with the Advanced Practitioner's note, impression, and recommendations as outlined.   EGD yesterday with large duodenal bulb ulcer with visible vessel, treated with BiCAP and epinephrine.  Transfused 4 unit PRBCs for hemoglobin 4 yesterday.  Posttransfusion hemoglobin 11.1, stable at 10.7 now.  Extubated earlier today.  No longer on pressors.  1) Duodenal ulcer 2) Acute blood loss anemia 3) Hemorrhagic shock-resolved - H/H stable and no further bleeding - Off pressors and extubated - Continue serial H/H checks for now - Continue high-dose PPI as outlined - Clears today.  If no further bleeding, can likely advance diet tomorrow - Dr. Collene Mares will assume his inpatient GI care starting tomorrow morning  [place], DO, FACG [phone] office         Progress Note   Subjective  Day # 4  CC; major GI bleed, hemorrhagic shock, intubated  PPI infusion  EGD yesterday after resuscitation-giant duodenal bulb ulcer with visible vessel and bleeding, treated with epi and coagulation  Hemoglobin of 4 yesterday, transfused 4 units up to 11.1 last p.m.> 10.4.>  9.9 this a.m. No active bleeding reported WBC 19.1  Patient is off pressors, semi-alert, hoping to wean off vent today per nursing.     Objective   Vital signs in last 24 hours: Temp:  [93.6 F (34.2 C)-98.2 F (36.8 C)] 98 F (36.7 C) (08/14 0800) Pulse Rate:  [73-125] 80 (08/14 0600) Resp:  [0-28] 18 (08/14 0600) BP: (37-184)/(13-118) 130/49 (08/14 0600) SpO2:  [87 %-100 %] 100 % (08/14 0750) FiO2 (%):  [40 %-70 %] 40 % (08/14 0750) Weight:  [82.9 kg-88.7 kg] 82.9 kg (08/14 0500) Last BM Date: 06/10/21 General: Elderly white male in intubated, opening eyes and trying to nod Heart:  Regular rate and rhythm; no  murmurs Lungs: Respirations even and unlabored, lungs CTA bilaterally Abdomen:  Soft, nontender and nondistended. Normal bowel sounds. Extremities:  Without edema. Neurologic: Opening eyes, nodding appropriately Psych:   Intake/Output from previous day: 08/13 0701 - 08/14 0700 In: 7685.1 [I.V.:5002; Blood:1545.2; IV Piggyback:1137.9] Out: 1825 [Urine:1825] Intake/Output this shift: No intake/output data recorded.  Lab Results: Recent Labs    06/09/21 0436 06/10/21 0806 06/10/21 1519 06/10/21 2100 06/11/21 0331  WBC 10.3 12.3*  --   --  19.1*  HGB 9.6* 4.0* 11.1* 10.4* 9.9*  HCT 30.9* 14.4* 32.4* 30.1* 29.2*  PLT 304 233  --   --  187   BMET Recent Labs    06/09/21 0436 06/10/21 0809 06/11/21 0329  NA 141 141 143  K 3.8 4.3 3.4*  CL 107 113* 113*  CO2 25 12* 23  GLUCOSE 136* 222* 82  BUN 22 36* 41*  CREATININE 0.84 1.21 1.13  CALCIUM 8.4* 6.9* 7.1*   LFT Recent Labs    06/11/21 0329  PROT 4.0*  ALBUMIN 1.8*  AST 40  ALT 42  ALKPHOS 52  BILITOT 0.9   PT/INR Recent Labs    06/10/21 1519  LABPROT 17.8*  INR 1.5*    Studies/Results: DG Chest 1 View  Result Date: 06/09/2021 CLINICAL DATA:  Tachycardia. Status post cystectomy 7/22. Failure to thrive. EXAM: CHEST  1 VIEW COMPARISON:  One-view chest x-ray 06/07/2021 FINDINGS: Heart size normal. Atherosclerotic changes are present at the aortic arch. Lung  volumes are low. No edema or effusion is present. No focal airspace disease is present. IMPRESSION: 1. Low lung volumes. 2. No acute cardiopulmonary disease. Electronically Signed   By: San Morelle M.D.   On: 06/09/2021 14:10   DG Abd 1 View  Result Date: 06/10/2021 CLINICAL DATA:  Orogastric tube placement EXAM: ABDOMEN - 1 VIEW COMPARISON:  None. FINDINGS: Gastric tube extends into the decompressed stomach. Bilateral nephroureteral catheters are noted. Visualized bowel gas pattern unremarkable. Multilevel spondylitic changes in the lumbar spine.  IMPRESSION: Gastric tube into decompressed stomach. Electronically Signed   By: Lucrezia Europe M.D.   On: 06/10/2021 12:22   DG CHEST PORT 1 VIEW  Result Date: 06/10/2021 CLINICAL DATA:  ETT and central line placement EXAM: PORTABLE CHEST - 1 VIEW COMPARISON:  the previous day's study FINDINGS: Endotracheal tube tip approximately 5.2 cm above carina. Nasogastric tube into the decompressed stomach. Left IJ central line is directed towards the lateral wall of the proximal SVC. No pneumothorax. Lungs are clear. Heart size and mediastinal contours are within normal limits. Aortic Atherosclerosis (ICD10-170.0). No effusion. Visualized bones unremarkable. IMPRESSION: 1. Support hardware placement as above.  No pneumothorax. Electronically Signed   By: Lucrezia Europe M.D.   On: 06/10/2021 12:21       Assessment / Plan:    #68 80 year old white male with major upper GI bleed, complicated by hemorrhagic shock, requiring intubation and pressors  EGD yesterday with finding of giant duodenal ulcer with visible vessel endoscopically treated with epinephrine and cautery.  Patient has not had any evidence of active bleeding since.  He has been able to be weaned off of pressors and blood pressure is stable today  Hoping to wean from vent per CCM  #2 anemia secondary to acute GI blood loss-stable status post multiple transfusions #3 coronary artery disease 4.  History of bladder cancer status post cystectomy and ileal conduit July 2022-urine culture growing gram-negative rods #5 Tourette's 6.  Sleep apnea  Plan; continue PPI infusion x72 hours, then twice daily PPI x8 weeks, then daily long-term Continue serial hemoglobins and transfuse as indicated Okay for clear liquids today if he is weaned off vent. GI will continue to follow   Principal Problem:   SIRS (systemic inflammatory response syndrome) (HCC) Active Problems:   OSA (obstructive sleep apnea)   Bladder cancer (HCC)   Mixed hyperlipidemia    Essential hypertension   Type 2 diabetes mellitus (HCC)   Malnutrition of moderate degree   Shock circulatory (HCC)   Hemorrhagic shock (HCC)   Duodenal ulcer   Acute duodenal ulcer with bleeding     LOS: 4 days   Amy Esterwood PA-C 06/11/2021, 8:35 AM

## 2021-06-11 NOTE — Progress Notes (Signed)
Patient was extubated today at [phone]. Alert and oriented. 2L [place] placed onto patient. Sats 98%. No complications.   Renae Gloss 06/11/2021

## 2021-06-11 NOTE — Procedures (Signed)
Extubation Procedure Note  Patient Details:   Name: [name]. DOB: 12-30-1940 MRN: [id number]   Airway Documentation:    Vent end date: 06/11/21 Vent end time: 0958   Evaluation  O2 sats: stable throughout Complications: No apparent complications Patient did tolerate procedure well. Bilateral Breath Sounds: Clear, Diminished   Placed on 4L nasal cannula pt able to speak.  Tamera Reason 06/11/2021, 10:04 AM

## 2021-06-12 DIAGNOSIS — Z4659 Encounter for fitting and adjustment of other gastrointestinal appliance and device: Secondary | ICD-10-CM

## 2021-06-12 DIAGNOSIS — D62 Acute posthemorrhagic anemia: Secondary | ICD-10-CM

## 2021-06-12 DIAGNOSIS — A419 Sepsis, unspecified organism: Secondary | ICD-10-CM

## 2021-06-12 DIAGNOSIS — K269 Duodenal ulcer, unspecified as acute or chronic, without hemorrhage or perforation: Secondary | ICD-10-CM

## 2021-06-12 DIAGNOSIS — G9340 Encephalopathy, unspecified: Secondary | ICD-10-CM

## 2021-06-12 DIAGNOSIS — R6510 Systemic inflammatory response syndrome (SIRS) of non-infectious origin without acute organ dysfunction: Secondary | ICD-10-CM | POA: Diagnosis not present

## 2021-06-12 DIAGNOSIS — E440 Moderate protein-calorie malnutrition: Secondary | ICD-10-CM

## 2021-06-12 DIAGNOSIS — R6521 Severe sepsis with septic shock: Secondary | ICD-10-CM

## 2021-06-12 DIAGNOSIS — I10 Essential (primary) hypertension: Secondary | ICD-10-CM

## 2021-06-12 LAB — CBC
HCT: 28 % — ABNORMAL LOW (ref 39.0–52.0)
Hemoglobin: 9.2 g/dL — ABNORMAL LOW (ref 13.0–17.0)
MCH: 29.5 pg (ref 26.0–34.0)
MCHC: 32.9 g/dL (ref 30.0–36.0)
MCV: 89.7 fL (ref 80.0–100.0)
Platelets: 174 10*3/uL (ref 150–400)
RBC: 3.12 MIL/uL — ABNORMAL LOW (ref 4.22–5.81)
RDW: 15.5 % (ref 11.5–15.5)
WBC: 16.9 10*3/uL — ABNORMAL HIGH (ref 4.0–10.5)
nRBC: 0 % (ref 0.0–0.2)

## 2021-06-12 LAB — CULTURE, BLOOD (ROUTINE X 2)
Culture: NO GROWTH
Culture: NO GROWTH
Special Requests: ADEQUATE

## 2021-06-12 LAB — GLUCOSE, CAPILLARY
Glucose-Capillary: 114 mg/dL — ABNORMAL HIGH (ref 70–99)
Glucose-Capillary: 116 mg/dL — ABNORMAL HIGH (ref 70–99)
Glucose-Capillary: 125 mg/dL — ABNORMAL HIGH (ref 70–99)
Glucose-Capillary: 126 mg/dL — ABNORMAL HIGH (ref 70–99)
Glucose-Capillary: 135 mg/dL — ABNORMAL HIGH (ref 70–99)
Glucose-Capillary: 150 mg/dL — ABNORMAL HIGH (ref 70–99)

## 2021-06-12 LAB — HEMOGLOBIN AND HEMATOCRIT, BLOOD
HCT: 28 % — ABNORMAL LOW (ref 39.0–52.0)
HCT: 29.4 % — ABNORMAL LOW (ref 39.0–52.0)
Hemoglobin: 9.2 g/dL — ABNORMAL LOW (ref 13.0–17.0)
Hemoglobin: 9.6 g/dL — ABNORMAL LOW (ref 13.0–17.0)

## 2021-06-12 LAB — COMPREHENSIVE METABOLIC PANEL
ALT: 33 U/L (ref 0–44)
AST: 23 U/L (ref 15–41)
Albumin: 2 g/dL — ABNORMAL LOW (ref 3.5–5.0)
Alkaline Phosphatase: 51 U/L (ref 38–126)
Anion gap: 3 — ABNORMAL LOW (ref 5–15)
BUN: 32 mg/dL — ABNORMAL HIGH (ref 8–23)
CO2: 23 mmol/L (ref 22–32)
Calcium: 7.3 mg/dL — ABNORMAL LOW (ref 8.9–10.3)
Chloride: 115 mmol/L — ABNORMAL HIGH (ref 98–111)
Creatinine, Ser: 1.15 mg/dL (ref 0.61–1.24)
GFR, Estimated: >60 mL/min (ref >60)
Glucose, Bld: 131 mg/dL — ABNORMAL HIGH (ref 70–99)
Potassium: 3.8 mmol/L (ref 3.5–5.1)
Sodium: 141 mmol/L (ref 135–145)
Total Bilirubin: 0.9 mg/dL (ref 0.3–1.2)
Total Protein: 4.3 g/dL — ABNORMAL LOW (ref 6.5–8.1)

## 2021-06-12 LAB — PHOSPHORUS: Phosphorus: 2.9 mg/dL (ref 2.5–4.6)

## 2021-06-12 LAB — MAGNESIUM: Magnesium: 1.9 mg/dL (ref 1.7–2.4)

## 2021-06-12 NOTE — Progress Notes (Signed)
  Speech Language Pathology Treatment: Dysphagia  Patient Details Name: [name]. MRN: [id number] DOB: 1941/05/23 Today's Date: 06/12/2021 Time: 0840-0900 SLP Time Calculation (min) (ACUTE ONLY): 20 min  Assessment / Plan / Recommendation Clinical Impression  Patient seen to address dysphagia goals via skilled observation of PO intake. Patient was alert but very focused on/perseverative about wanting to go home. He presents with confusion, telling SLP he wanted to "stay here and die or go home and live". He declined any solid texture PO's but did take several consecutive straw sips of thin liquids. No overt s/s aspiration or penetration observed and patient's voice remained strong and clear. Patient appears to be tolerating PO diet well at this time.   HPI HPI: 80 year old with medical history significant for bladder cancer status post cystectomy, prostatectomy and ileal conduit surgery on 05/19/2021, coronary artery disease, hyperlipidemia, essential hypertension, OSA on CPAP, Tourette's syndrome.  Pt was intubated from 8/13-8/14.      SLP Plan  Continue with current plan of care       Recommendations  Diet recommendations: Regular;Thin liquid Liquids provided via: Cup;Straw Medication Administration: Whole meds with puree Supervision: Patient able to self feed;Intermittent supervision to cue for compensatory strategies Compensations: Minimize environmental distractions;Slow rate;Small sips/bites Postural Changes and/or Swallow Maneuvers: Seated upright 90 degrees                Oral Care Recommendations: Oral care BID Follow up Recommendations: None SLP Visit Diagnosis: Dysphagia, unspecified (R13.10) Plan: Continue with current plan of care       GO                Sonia Baller, MA, CCC-SLP Speech Therapy

## 2021-06-12 NOTE — TOC Initial Note (Signed)
Transition of Care (TOC) - Initial/Assessment Note    Patient Details  Name: [name]. MRN: [id number] Date of Birth: 09-05-1941  Transition of Care [facility]) CM/SW Contact:    Leeroy Cha, RN Phone Number: 06/12/2021, 7:37 AM  Clinical Narrative:                 From h and P  80 y.o. male with medical history significant of bladder cancer s/p cystectomy, prostatectomy and ileal conduit surgery on July 22, coronary artery disease, hyperlipidemia, essential hypertension, obstructive sleep apnea, Tourette's syndrome among other things who was brought in today secondary to progressive weakness failure to thrive at home and generalized malaise since his surgery.  Per family he has been declining with poor oral intake.  Patient was noted to be dehydrated.  Received fluid resuscitation in the ER.  Still lethargic and weak.  Urinalysis not impressive for a UTI but he meets SIRS criteria.  ER physician has discussed with urology who recommends treating patient as if he has a UTI postoperatively.  Patient therefore being admitted to the hospital for further evaluation and treatment.   ED Course: Temperature is 98.2 blood pressure 97/85, pulse 114, respiratory of 21 oxygen sat 93% on room air.  White count 15.1 hemoglobin 11.1 platelets 331.  Chemistry showed BUN 36 otherwise mostly within normal.  Lactic acid is 2.5.  Head CT without contrast showed no acute findings CT abdomen pelvis showed interval prostatectomy and cystectomy with right lower quadrant urostomy.  There is mild right and moderate left hydronephrosis with stents in place.  Also infrarenal abdominal aortic aneurysm of 3.7 cm.  Patient being admitted for treatment by urology  from [phone] notes Significant Events: 06/07/2021 - admit 8/13 - agitation, intubation, massive gi bleed, shock -> scope DU ucler and s;p epi (on ppi gtt and pepcid). S/p 4 U  PRBC       Assessment/Plan:    Sepsis due to Enterobacter cloacae UTI in the  setting of postoperative surgical intervention: Urine culture on admission show Enterobacter, he was initially started on Rocephin which was changed to IV cefepime which we will continue for total of 7 days.  Starting date 06/10/2021. Blood cultures remain negative till date. He is having good urine output renal ultrasound day 1 showed resolved right hydronephrosis. Urology on board. Physical therapy has been consulted they recommended skilled nursing facility. Will need to remove central line once he moves to the floor.   Acute respiratory failure requiring intubation due to encephalopathy on 06/10/2021: Extubated on 06/11/2021 Encephalopathy resolved likely due to hemorrhagic shock. Was given Lasix on 06/12/2019.   Hemorrhagic shock: Required intubation status post 4 units of packed red blood cells. EGD performed showed an active bleeding ulcer which was endoscopically cauterized Require pressors now off. Continue high-dose PPI GI on board appreciate assistance. On a regular diet he is hungry.   History of coronary artery disease: Cardiac biomarkers remain flat, echo was done that showed an EF of 55% right ventricular function was preserved no significant valve abnormalities.  History of right bundle branch block and first-degree AV block: In sinus rhythm   Diabetes mellitus type 2: With an A1c of 6.5: Blood glucose fairly controlled continue sliding scale.   OSA (obstructive sleep apnea) Continue CPAP at night.   Bladder cancer [facility]) Follow-up with urology as an outpatient.  [id number] patient was extubated to room air, c-pap at hs. Iv maxipime, Had pt and rn through [facility] will need resumption  of care/at dc  Expected Discharge Plan: [place] Barriers to Discharge: Continued Medical Work up   Patient Goals and CMS Choice Patient states their goals for this hospitalization and ongoing recovery are:: to go home CMS Medicare.gov Compare Post Acute Care  list provided to:: Patient    Expected Discharge Plan and Services Expected Discharge Plan: [place]   Discharge Planning Services: CM Consult Post Acute Care Choice: Resumption of Svcs/PTA Provider Living arrangements for the past 2 months: Single Family Home Expected Discharge Date:  (unknown)                                    Prior Living Arrangements/Services Living arrangements for the past 2 months: Single Family Home Lives with:: Spouse Patient language and need for interpreter reviewed:: Yes Do you feel safe going back to the place where you live?: Yes            Criminal Activity/Legal Involvement Pertinent to Current Situation/Hospitalization: No - Comment as needed  Activities of Daily Living Home Assistive Devices/Equipment: Cane (specify quad or straight), Walker (specify type), Eyeglasses, Ostomy supplies, Other (Comment) (urostomy, single point cane, quad cane, front wheeled walker) ADL Screening (condition at time of admission) Patient's cognitive ability adequate to safely complete daily activities?: Yes Is the patient deaf or have difficulty hearing?: No Does the patient have difficulty seeing, even when wearing glasses/contacts?: Yes Does the patient have difficulty concentrating, remembering, or making decisions?: No Patient able to express need for assistance with ADLs?: Yes Does the patient have difficulty dressing or bathing?: Yes Independently performs ADLs?: No Communication: Independent Dressing (OT): Needs assistance Is this a change from baseline?: Pre-admission baseline Grooming: Needs assistance Is this a change from baseline?: Pre-admission baseline Feeding: Needs assistance Is this a change from baseline?: Pre-admission baseline Bathing: Needs assistance Is this a change from baseline?: Pre-admission baseline Toileting: Needs assistance Is this a change from baseline?: Change from baseline, expected to last  >3days In/Out Bed: Needs assistance Is this a change from baseline?: Change from baseline, expected to last >3 days Walks in Home: Needs assistance Is this a change from baseline?: Change from baseline, expected to last >3 days Does the patient have difficulty walking or climbing stairs?: Yes (secondary to weakness) Weakness of Legs: Both (patient has peripheral neuropathy) Weakness of Arms/Hands: None  Permission Sought/Granted                  Emotional Assessment Appearance:: Appears stated age     Orientation: : Fluctuating Orientation (Suspected and/or reported Sundowners) Alcohol / Substance Use: Not Applicable Psych Involvement: No (comment)  Admission diagnosis:  Dehydration [E86.0] Hypovolemia [E86.1] SIRS (systemic inflammatory response syndrome) ([place]) [R65.10] Urinary tract infection without hematuria, site unspecified [N39.0] Patient Active Problem List   Diagnosis Date Noted   Acute blood loss anemia    Shock circulatory ([place])    Hemorrhagic shock ([place])    Duodenal ulcer    Acute duodenal ulcer with bleeding    Malnutrition of moderate degree 06/09/2021   SIRS (systemic inflammatory response syndrome) ([place]) 06/07/2021   Bladder cancer ([place]) 05/19/2021   Urinary incontinence 03/28/2021   Mixed hyperlipidemia 03/21/2021   Essential hypertension 03/21/2021   Type 2 diabetes mellitus ([place]) 03/21/2021   Malignant neoplasm of overlapping sites of bladder ([place]) 01/17/2021   OSA (obstructive sleep apnea) 08/05/2013   Tourette's syndrome 04/01/2013  PCP:  Celene Squibb, MD Pharmacy:   [id number] [place] [id number]: [phone]: [phone]    Social Determinants of Health (SDOH) Interventions    Readmission Risk Interventions No flowsheet data found.

## 2021-06-12 NOTE — Progress Notes (Addendum)
Physical Therapy Treatment Patient Details Name: [name]. MRN: [id number] DOB: 05/20/1941 Today's Date: 06/12/2021    History of Present Illness 80 y.o. male admitted 06/08/21 with progressive weakness failure to thrive at home and generalized malaise since his surgery.  Per family he has been declining with poor oral intake.  Patient was noted to be dehydrated. Dx of SIRS. Pt  with medical history significant of bladder cancer s/p cystectomy, prostatectomy and ileal conduit surgery on May 19, 2021, coronary artery disease, hyperlipidemia, essential hypertension, obstructive sleep apnea, Tourette's syndrome.    PT Comments    Improved activity tolerance today. Min assist for sit to stand with RW and min/guard assist to take a few pivotal steps to the recliner. HR 135 max with activity, ambulation deferred 2* elevated HR. Pt puts forth good effort.     Follow Up Recommendations  SNF;Supervision/Assistance - 24 hour;Supervision for mobility/OOB vs. HHPT (depending on progress)     Equipment Recommendations  None recommended by PT    Recommendations for Other Services       Precautions / Restrictions Precautions Precautions: Fall Precaution Comments: ileal conduit/cystectomy 05/19/21 Restrictions Weight Bearing Restrictions: No    Mobility  Bed Mobility Overal bed mobility: Needs Assistance Bed Mobility: Rolling;Sidelying to Sit Rolling: Supervision Sidelying to sit: Min guard;HOB elevated       General bed mobility comments: VCs for technique, HOB up, used bedrail    Transfers Overall transfer level: Needs assistance Equipment used: Rolling walker (2 wheeled) Transfers: Sit to/from Stand Sit to Stand: From elevated surface;Min assist;+2 safety/equipment  Stand pivot transfer: min/guard assist       General transfer comment: VCs hand placement, min A to power up, pt took several pivotal steps from bed to recliner. Did not attempt ambulation 2* HR 135 max with  activity.  Ambulation/Gait                 Stairs             Wheelchair Mobility    Modified Rankin (Stroke Patients Only)       Balance Overall balance assessment: Needs assistance Sitting-balance support: Feet supported;No upper extremity supported Sitting balance-Leahy Scale: Fair     Standing balance support: Bilateral upper extremity supported Standing balance-Leahy Scale: Poor Standing balance comment: relies on BUE support                            Cognition Arousal/Alertness: Awake/alert Behavior During Therapy: WFL for tasks assessed/performed Overall Cognitive Status: Impaired/Different from baseline Area of Impairment: Following commands;Problem solving                       Following Commands: Follows one step commands with increased time       General Comments: difficult to keep his focus on task at hand, follows commands with increased time and repeated commands, distracted      Exercises General Exercises - Lower Extremity Hip Flexion/Marching: AROM;Both;10 reps;Standing    General Comments        Pertinent Vitals/Pain Pain Assessment: No/denies pain Faces Pain Scale: No hurt    Home Living                      Prior Function            PT Goals (current goals can now be found in the care plan section) Acute Rehab PT Goals Patient  Stated Goal: to be able to go home PT Goal Formulation: With patient Time For Goal Achievement: 06/23/21 Potential to Achieve Goals: Good Progress towards PT goals: Progressing toward goals    Frequency    Min 2X/week      PT Plan Current plan remains appropriate    Co-evaluation PT/OT/SLP Co-Evaluation/Treatment: Yes Reason for Co-Treatment: Complexity of the patient's impairments (multi-system involvement);For patient/therapist safety;To address functional/ADL transfers PT goals addressed during session: Mobility/safety with mobility;Balance;Proper use  of DME;Strengthening/ROM        AM-PAC PT "6 Clicks" Mobility   Outcome Measure  Help needed turning from your back to your side while in a flat bed without using bedrails?: A Little Help needed moving from lying on your back to sitting on the side of a flat bed without using bedrails?: A Lot Help needed moving to and from a bed to a chair (including a wheelchair)?: A Lot Help needed standing up from a chair using your arms (e.g., wheelchair or bedside chair)?: A Little Help needed to walk in hospital room?: A Lot Help needed climbing 3-5 steps with a railing? : Total 6 Click Score: 13    End of Session Equipment Utilized During Treatment: Gait belt Activity Tolerance: Patient tolerated treatment well Patient left: with family/visitor present;with call bell/phone within reach;in chair Nurse Communication: Mobility status PT Visit Diagnosis: Difficulty in walking, not elsewhere classified (R26.2);Adult, failure to thrive (R62.7);Other abnormalities of gait and mobility (R26.89)     Time: [id number] PT Time Calculation (min) (ACUTE ONLY): 20 min  Charges:  $Therapeutic Activity: 8-22 mins                     Blondell Reveal Kistler PT 06/12/2021  Acute Rehabilitation Services Pager [phone] Office [phone]

## 2021-06-12 NOTE — Plan of Care (Signed)
Plan of care discussed with patient and family members(son and grandson)and family expressed an understanding and agree with the plan of care

## 2021-06-12 NOTE — Evaluation (Signed)
Occupational Therapy Evaluation Patient Details Name: [name]. MRN: [id number] DOB: [birth year] Today's Date: 06/12/2021    History of Present Illness 80 y.o. male admitted 06/08/21 with progressive weakness failure to thrive at home and generalized malaise since his surgery.  Per family he has been declining with poor oral intake.  Patient was noted to be dehydrated. Dx of SIRS. Pt  with medical history significant of bladder cancer s/p cystectomy, prostatectomy and ileal conduit surgery on May 19, 2021, coronary artery disease, hyperlipidemia, essential hypertension, obstructive sleep apnea, Tourette's syndrome.   Clinical Impression   Patient is a pleasant 80 year old male who was admitted for above. Patient was living at home with wife and family support. Currently, patient is mod A for sit to stand with min a  2 for safety to transfer from edge of bed to recliner. Patient is max A for LB dressing tasks. Patients HR was noted to elevate to 135 bpm with activity. Nursing made aware. Patient was noted to have decreased activity tolerance, decreased endurance, decreased standing balance, and change in cognition impacting patients ability to complete ADLs. Patient would continue to benefit from skilled OT services at this time while admitted and after d/c to address noted deficits in order to improve overall safety and independence in ADLs.      Follow Up Recommendations  SNF;Supervision/Assistance - 24 hour;Home health OT    Equipment Recommendations  Tub/shower seat    Recommendations for Other Services       Precautions / Restrictions Precautions Precautions: Fall Precaution Comments: ileal conduit/cystectomy 05/19/21 Restrictions Weight Bearing Restrictions: No      Mobility Bed Mobility Overal bed mobility: Needs Assistance Bed Mobility: Rolling;Sidelying to Sit Rolling: Supervision Sidelying to sit: Min guard;HOB elevated Supine to sit: Mod assist;HOB elevated Sit  to supine: Min assist   General bed mobility comments: VCs for technique, HOB up, used bedrail    Transfers Overall transfer level: Needs assistance Equipment used: Rolling walker (2 wheeled) Transfers: Sit to/from [facility] Sit to Stand: From elevated surface;Min assist;+2 safety/equipment Stand pivot transfers: Min guard       General transfer comment: VCs hand placement, min A to power up, pt took several pivotal steps from bed to recliner. Did not attempt ambulation 2* HR 135 max with activity.    Balance Overall balance assessment: Needs assistance Sitting-balance support: Feet supported;No upper extremity supported Sitting balance-Leahy Scale: Fair     Standing balance support: Bilateral upper extremity supported Standing balance-Leahy Scale: Poor Standing balance comment: relies on BUE support                           ADL either performed or assessed with clinical judgement   ADL                                               Vision Patient Visual Report: No change from baseline Additional Comments: patient and family member in room report patient has a cataract in R eye that needs to be addressed with L eye having had one removed.     Perception     Praxis      Pertinent Vitals/Pain Pain Assessment: No/denies pain Faces Pain Scale: No hurt     Hand Dominance Right   Extremity/Trunk Assessment Upper Extremity Assessment Upper Extremity  Assessment: Overall WFL for tasks assessed   Lower Extremity Assessment Lower Extremity Assessment: Defer to PT evaluation   Cervical / Trunk Assessment Cervical / Trunk Assessment: Normal   Communication Communication Communication: No difficulties   Cognition Arousal/Alertness: Awake/alert Behavior During Therapy: WFL for tasks assessed/performed Overall Cognitive Status: Impaired/Different from baseline Area of Impairment: Following commands;Problem solving                        Following Commands: Follows one step commands with increased time       General Comments: patient is easily fixated on one specific thing during session with consistent cues to redirect and attend to task. patient is still cooperative with tasks.   General Comments       Exercises General Exercises - Lower Extremity Hip Flexion/Marching: AROM;Both;10 reps;Standing   Shoulder Instructions      Home Living Family/patient expects to be discharged to:: Private residence Living Arrangements: Spouse/significant other Available Help at Discharge: Family Type of Home: House Home Access: Stairs to enter [employer] of Steps: 2 Entrance Stairs-Rails: Left Home Layout: Two level;Able to live on main level with bedroom/bathroom     Bathroom Shower/Tub: Teacher, early years/pre: Standard Bathroom Accessibility: Yes   Home Equipment: Grab bars - tub/shower;Walker - 2 wheels;Cane - single point;Cane - quad   Additional Comments: Pt lives with wife (who has h/o stroke and cannot provide much physical assist) and has 2 sons close by.      Prior Functioning/Environment Level of Independence: Needs assistance  Gait / Transfers Assistance Needed: patient was walking over 300 feet at hospital prior to d/c home per notes with RW ADL's / Homemaking Assistance Needed: patient was min guard for ADL tasks with RW per last OT note with min A for sit to stands   Comments: denies falls.        OT Problem List: Decreased activity tolerance;Impaired balance (sitting and/or standing);Decreased safety awareness;Decreased knowledge of use of DME or AE;Decreased knowledge of precautions;Pain;Obesity;Decreased strength;Decreased cognition      OT Treatment/Interventions: Self-care/ADL training;DME and/or AE instruction;Balance training;Patient/family education;Therapeutic activities    OT Goals(Current goals can be found in the care plan section) Acute  Rehab OT Goals Patient Stated Goal: to be able to go home OT Goal Formulation: With patient Time For Goal Achievement: 06/26/21 Potential to Achieve Goals: Good  OT Frequency:     Barriers to D/C:    patient lives at home with wife who is unable to offer much assistance       Co-evaluation PT/OT/SLP Co-Evaluation/Treatment: Yes Reason for Co-Treatment: Complexity of the patient's impairments (multi-system involvement) PT goals addressed during session: Mobility/safety with mobility OT goals addressed during session: ADL's and self-care      AM-PAC OT "6 Clicks" Daily Activity     Outcome Measure Help from another person eating meals?: None Help from another person taking care of personal grooming?: A Little Help from another person toileting, which includes using toliet, bedpan, or urinal?: A Lot Help from another person bathing (including washing, rinsing, drying)?: A Lot Help from another person to put on and taking off regular upper body clothing?: A Little Help from another person to put on and taking off regular lower body clothing?: A Lot 6 Click Score: 16   End of Session Equipment Utilized During Treatment: Rolling walker Nurse Communication: Mobility status  Activity Tolerance: Patient tolerated treatment well Patient left: in chair;with call bell/phone within reach;with family/visitor present  OT Visit Diagnosis: Unsteadiness on feet (R26.81);Other abnormalities of gait and mobility (R26.89)                Time: [id number] OT Time Calculation (min): 22 min Charges:  OT General Charges $OT Visit: 1 Visit OT Evaluation $OT Eval Moderate Complexity: 1 Mod  Jackelyn Poling OTR/L, MS Acute Rehabilitation Department Office# [phone] Pager# [phone]   [place] 06/12/2021, 1:16 PM

## 2021-06-12 NOTE — Progress Notes (Signed)
[facility] PROGRESS NOTE    Progress Note  [name].  [id number] DOB: 10-06-1941 DOA: 06/07/2021 PCP: Celene Squibb, MD     Brief Narrative:   Alex Kosior. is an 80 y.o. male past medical history significant for bladder cancer status post cystectomy and prostatectomy with an ileal conduit surgery with stent placement on 05/19/2021 coronary artery disease essential hypertension obstructive sleep apnea, Tourette's syndrome remained to the hospital for generalized weakness and fatigue postsurgery with a significant decline in his functional ability and poor oral intake on admission on 06/07/2021 he was found to be in SIRS, he was progressing well but on 06/09/2021 found to have a hemoglobin of 9.6 melanotic stools GI was consulted,  overnight became agitated and confused had to be emergently intubated at 06/10/2021 probably due to hemorrhagic shock was found to have a hemoglobin of 4 . EGD was performed on 06/10/2021 that showed bleeding duodenal bulb ulcer with visible vessel with bleeding status post endoscopic homeostasis.    Significant Events: 06/07/2021 - admit 8/13 - agitation, intubation, massive gi bleed, shock -> scope DU ucler and s;p epi (on ppi gtt and pepcid). S/p 4 U  PRBC    Assessment/Plan:   Sepsis due to Enterobacter cloacae UTI in the setting of postoperative surgical intervention: Urine culture on admission show Enterobacter, he was initially started on Rocephin which was changed to IV cefepime which we will continue for total of 7 days.  Starting date 06/10/2021. Blood cultures remain negative till date. He is having good urine output renal ultrasound day 1 showed resolved right hydronephrosis. Urology on board. Physical therapy has been consulted they recommended skilled nursing facility. Will need to remove central line once he moves to the floor.  Acute respiratory failure requiring intubation due to encephalopathy on 06/10/2021: Extubated on  06/11/2021 Encephalopathy resolved likely due to hemorrhagic shock. Was given Lasix on 06/12/2019.  Hemorrhagic shock: Required intubation status post 4 units of packed red blood cells. EGD performed showed an active bleeding ulcer which was endoscopically cauterized Require pressors now off. Continue high-dose PPI GI on board appreciate assistance. On a regular diet he is hungry.  History of coronary artery disease: Cardiac biomarkers remain flat, echo was done that showed an EF of 55% right ventricular function was preserved no significant valve abnormalities.  History of right bundle branch block and first-degree AV block: In sinus rhythm  Diabetes mellitus type 2: With an A1c of 6.5: Blood glucose fairly controlled continue sliding scale.  OSA (obstructive sleep apnea) Continue CPAP at night.  Bladder cancer [facility]) Follow-up with urology as an outpatient.   DVT prophylaxis: scd Family Communication:none Status is: Inpatient  Remains inpatient appropriate because:Hemodynamically unstable  Dispo: The patient is from: Home              Anticipated d/c is to: SNF              Patient currently is not medically stable to d/c.   Difficult to place patient No    Code Status:     Code Status Orders  (From admission, onward)           Start     Ordered   06/07/21 2352  Full code  Continuous        06/07/21 2351           Code Status History     Date Active Date Inactive Code Status Order ID Comments User Context   05/19/2021  2034 05/25/2021 1720 Full Code [id number]  Lattie Corns Inpatient   02/03/2021 1550 02/05/2021 1648 Full Code [id number]  Irine Seal, MD Inpatient   01/17/2021 1537 01/18/2021 1724 Full Code [id number]  Irine Seal, MD Inpatient         IV Access:   Peripheral IV   Procedures and diagnostic studies:   DG Abd 1 View  Result Date: 06/10/2021 CLINICAL DATA:  Orogastric tube placement EXAM: ABDOMEN - 1 VIEW COMPARISON:  None.  FINDINGS: Gastric tube extends into the decompressed stomach. Bilateral nephroureteral catheters are noted. Visualized bowel gas pattern unremarkable. Multilevel spondylitic changes in the lumbar spine. IMPRESSION: Gastric tube into decompressed stomach. Electronically Signed   By: Lucrezia Europe M.D.   On: 06/10/2021 12:22   DG CHEST PORT 1 VIEW  Result Date: 06/10/2021 CLINICAL DATA:  ETT and central line placement EXAM: PORTABLE CHEST - 1 VIEW COMPARISON:  the previous day's study FINDINGS: Endotracheal tube tip approximately 5.2 cm above carina. Nasogastric tube into the decompressed stomach. Left IJ central line is directed towards the lateral wall of the proximal SVC. No pneumothorax. Lungs are clear. Heart size and mediastinal contours are within normal limits. Aortic Atherosclerosis (ICD10-170.0). No effusion. Visualized bones unremarkable. IMPRESSION: 1. Support hardware placement as above.  No pneumothorax. Electronically Signed   By: Lucrezia Europe M.D.   On: 06/10/2021 12:21   ECHOCARDIOGRAM COMPLETE  Result Date: 06/11/2021    ECHOCARDIOGRAM REPORT   Patient Name:   Alex Buehl. Date of Exam: 06/11/2021 Medical Rec #:  [id number]           Height:       70.0 in Accession #:    [id number]          Weight:       182.8 lb Date of Birth:  1941-08-10           BSA:          2.009 m Patient Age:    89 years            BP:           135/82 mmHg Patient Gender: M                   HR:           100 bpm. Exam Location:  Inpatient Procedure: 2D Echo, Color Doppler, Cardiac Doppler and Intracardiac            Opacification Agent Indications:    Cardiac arrest I46.9  History:        Patient has no prior history of Echocardiogram examinations.                 CAD, Arrythmias:First Degree AV block and RBBB; Risk                 Factors:Hypertension, Dyslipidemia and Sleep Apnea.  Sonographer:    Darlina Sicilian RDCS Referring Phys: [place]  1. Left ventricular ejection fraction, by  estimation, is 55 to 60%. The left ventricle has normal function. The left ventricle has no regional wall motion abnormalities. Left ventricular diastolic function could not be evaluated.  2. Right ventricular systolic function is normal. The right ventricular size is not well visualized.  3. The mitral valve was not well visualized. Trivial mitral valve regurgitation. Moderate mitral annular calcification.  4. The aortic valve is tricuspid. There is moderate calcification of the aortic valve. Aortic valve regurgitation is not  visualized. Mild to moderate aortic valve sclerosis/calcification is present, without any evidence of aortic stenosis. Comparison(s): No prior Echocardiogram. Conclusion(s)/Recommendation(s): Limited windows, only subcostal images interpretable, even with use of echo contrast. Grossly normal LV and RV function without apparent severe valve disease, but cannot exclude mild abnormalities given limited windows. FINDINGS  Left Ventricle: Left ventricular ejection fraction, by estimation, is 55 to 60%. The left ventricle has normal function. The left ventricle has no regional wall motion abnormalities. Definity contrast agent was given IV to delineate the left ventricular  endocardial borders. The left ventricular internal cavity size was normal in size. There is no left ventricular hypertrophy. Left ventricular diastolic function could not be evaluated. Right Ventricle: The right ventricular size is not well visualized. Right vetricular wall thickness was not well visualized. Right ventricular systolic function is normal. Left Atrium: Left atrial size was not well visualized. Right Atrium: Right atrial size was not well visualized. Pericardium: There is no evidence of pericardial effusion. Mitral Valve: The mitral valve was not well visualized. Moderate mitral annular calcification. Trivial mitral valve regurgitation. Tricuspid Valve: The tricuspid valve is not well visualized. Tricuspid valve  regurgitation is trivial. No evidence of tricuspid stenosis. Aortic Valve: The aortic valve is tricuspid. There is moderate calcification of the aortic valve. Aortic valve regurgitation is not visualized. Mild to moderate aortic valve sclerosis/calcification is present, without any evidence of aortic stenosis. Pulmonic Valve: The pulmonic valve was not well visualized. Pulmonic valve regurgitation is not visualized. No evidence of pulmonic stenosis. Aorta: The aortic root was not well visualized, the ascending aorta was not well visualized and the aortic arch was not well visualized. Venous: IVC assessment for right atrial pressure unable to be performed due to mechanical ventilation. IAS/Shunts: The atrial septum is grossly normal. Buford Dresser MD Electronically signed by Buford Dresser MD Signature Date/Time: 06/11/2021/2:59:45 PM    Final      Medical Consultants:   None.   Subjective:    [name]. feels better relates he is hungry no complaints.  Objective:    Vitals:   06/12/21 0400 06/12/21 0500 06/12/21 0530 06/12/21 0600  BP: 118/86  102/67 114/75  Pulse: 83 63 71 68  Resp: '14 14 14 16  '$ Temp:   97.8 F (36.6 C)   TempSrc:   Axillary   SpO2: 99% 100% 100% 99%  Weight:      Height:       SpO2: 99 % O2 Flow Rate (L/min): 2 L/min FiO2 (%): 40 %   Intake/Output Summary (Last 24 hours) at 06/12/2021 0659 Last data filed at 06/12/2021 [id number] Gross per 24 hour  Intake 2165.02 ml  Output 3150 ml  Net -984.98 ml   Filed Weights   06/09/21 0430 06/10/21 1400 06/11/21 0500  Weight: 79.3 kg 88.7 kg 82.9 kg    Exam: General exam: In no acute distress. Respiratory system: Good air movement and clear to auscultation. Cardiovascular system: S1 & S2 heard, RRR.  Central line in place Gastrointestinal system: Abdomen is nondistended, soft and nontender.  Extremities: No pedal edema. Skin: No rashes, lesions or ulcers Data Reviewed:    Labs: Basic  Metabolic Panel: Recent Labs  Lab 06/07/21 1821 06/08/21 0426 06/09/21 0436 06/10/21 0809 06/11/21 0329 06/12/21 0330  NA  --  136 141 141 143 141  K  --  3.8 3.8 4.3 3.4* 3.8  CL  --  104 107 113* 113* 115*  CO2  --  25 25 12* 23 23  GLUCOSE  --  149* 136* 222* 82 131*  BUN  --  28* 22 36* 41* 32*  CREATININE  --  0.84 0.84 1.21 1.13 1.15  CALCIUM  --  8.5* 8.4* 6.9* 7.1* 7.3*  MG 1.7  --   --  1.7 2.1 1.9  PHOS  --   --   --  4.4 2.6 2.9   GFR Estimated Creatinine Clearance: 51.1 mL/min (by C-G formula based on SCr of 1.15 mg/dL). Liver Function Tests: Recent Labs  Lab 06/08/21 0426 06/09/21 0436 06/10/21 0809 06/11/21 0329 06/12/21 0330  AST '27 23 27 '$ 40 23  ALT 35 34 22 42 33  ALKPHOS 78 75 43 52 51  BILITOT 0.2* 0.6 0.4 0.9 0.9  PROT 5.7* 5.7* 3.4* 4.0* 4.3*  ALBUMIN 2.1* 2.2* 1.4* 1.8* 2.0*   Recent Labs  Lab 06/10/21 0809  LIPASE 59*   Recent Labs  Lab 06/07/21 1822  AMMONIA 14   Coagulation profile Recent Labs  Lab 06/07/21 1752 06/10/21 1519  INR 1.1 1.5*   COVID-19 Labs  No results for input(s): DDIMER, FERRITIN, LDH, CRP in the last 72 hours.  Lab Results  Component Value Date   [place] NEGATIVE 06/07/2021   [place] NEGATIVE 05/18/2021   [place] NEGATIVE 02/01/2021   [place] NEGATIVE 01/13/2021    CBC: Recent Labs  Lab 06/07/21 1752 06/08/21 0426 06/09/21 0436 06/10/21 0806 06/10/21 1519 06/11/21 0331 06/11/21 0950 06/11/21 1602 06/11/21 2150 06/12/21 0330  WBC 15.0* 11.1* 10.3 12.3*  --  19.1*  --   --   --  16.9*  NEUTROABS 11.9*  --   --   --   --   --   --   --   --   --   HGB 11.6* 9.7* 9.6* 4.0*   < > 9.9* 10.7* 10.3* 9.7* 9.2*  HCT 37.2* 30.0* 30.9* 14.4*   < > 29.2* 31.4* 30.7* 29.3* 28.0*  MCV 90.7 88.8 90.6 101.4*  --  86.4  --   --   --  89.7  PLT 331 260 304 233  --  187  --   --   --  174   < > = values in this interval not displayed.   Cardiac Enzymes: No results for input(s): CKTOTAL,  CKMB, CKMBINDEX, TROPONINI in the last 168 hours. BNP (last 3 results) No results for input(s): PROBNP in the last 8760 hours. CBG: Recent Labs  Lab 06/11/21 1152 06/11/21 1604 06/11/21 2135 06/11/21 2334 06/12/21 0330  GLUCAP 91 110* 139* 139* 125*   D-Dimer: No results for input(s): DDIMER in the last 72 hours. Hgb A1c: Recent Labs    06/10/21 1518  HGBA1C 6.2*   Lipid Profile: No results for input(s): CHOL, HDL, LDLCALC, TRIG, CHOLHDL, LDLDIRECT in the last 72 hours. Thyroid function studies: No results for input(s): TSH, T4TOTAL, T3FREE, THYROIDAB in the last 72 hours.  Invalid input(s): FREET3 Anemia work up: No results for input(s): VITAMINB12, FOLATE, FERRITIN, TIBC, IRON, RETICCTPCT in the last 72 hours. Sepsis Labs: Recent Labs  Lab 06/07/21 2020 06/08/21 0426 06/09/21 0436 06/10/21 0806 06/10/21 0809 06/10/21 1518 06/11/21 0327 06/11/21 0329 06/11/21 0331 06/12/21 0330  PROCALCITON  --   --   --   --  <0.10  --   --  12.50  --   --   WBC  --    < > 10.3 12.3*  --   --   --   --  19.1* 16.9*  LATICACIDVEN  1.4  --   --   --  >11.0* 2.8* 1.2  --   --   --    < > = values in this interval not displayed.   Microbiology Recent Results (from the past 240 hour(s))  Urine Culture     Status: Abnormal   Collection Time: 06/07/21  5:35 PM   Specimen: In/Out Cath Urine  Result Value Ref Range Status   Specimen Description   Final    IN/OUT CATH URINE Performed at [facility], [place] [address], [place], [place] [address]    Special Requests   Final    NONE Performed at [facility], [place] [address] [place], [place] [address]    Culture >=100,000 COLONIES/mL ENTEROBACTER CLOACAE (A)  Final   Report Status 06/10/2021 FINAL  Final   Organism ID, Bacteria ENTEROBACTER CLOACAE (A)  Final      Susceptibility   Enterobacter cloacae - MIC*    CEFAZOLIN >=64 RESISTANT Resistant     CEFEPIME <=0.12 SENSITIVE Sensitive      CIPROFLOXACIN <=0.25 SENSITIVE Sensitive     GENTAMICIN <=1 SENSITIVE Sensitive     IMIPENEM 0.5 SENSITIVE Sensitive     NITROFURANTOIN 32 SENSITIVE Sensitive     TRIMETH/SULFA <=20 SENSITIVE Sensitive     PIP/TAZO <=4 SENSITIVE Sensitive     * >=100,000 COLONIES/mL ENTEROBACTER CLOACAE  Blood Culture (routine x 2)     Status: None (Preliminary result)   Collection Time: 06/07/21  5:40 PM   Specimen: BLOOD RIGHT FOREARM  Result Value Ref Range Status   Specimen Description BLOOD RIGHT FOREARM  Final   Special Requests   Final    BOTTLES DRAWN AEROBIC AND ANAEROBIC Blood Culture results may not be optimal due to an excessive volume of blood received in culture bottles   Culture   Final    NO GROWTH 4 DAYS Performed at [facility], [place] [address], [place], [place] [address]    Report Status PENDING  Incomplete  Blood Culture (routine x 2)     Status: None (Preliminary result)   Collection Time: 06/07/21  5:55 PM   Specimen: BLOOD  Result Value Ref Range Status   Specimen Description   Final    BLOOD LEFT ANTECUBITAL Performed at [place] [address], [place], [place] [address]    Special Requests   Final    BOTTLES DRAWN AEROBIC AND ANAEROBIC Blood Culture adequate volume   Culture   Final    NO GROWTH 4 DAYS Performed at [facility], [place] [address], [place],  [address]    Report Status PENDING  Incomplete  Resp Panel by RT-PCR (Flu A&B, Covid) Nasopharyngeal Swab     Status: None   Collection Time: 06/07/21  6:30 PM   Specimen: Nasopharyngeal Swab; Nasopharyngeal(NP) swabs in vial transport medium  Result Value Ref Range Status   SARS Coronavirus 2 by RT PCR NEGATIVE NEGATIVE Final    Comment: (NOTE) SARS-CoV-2 target nucleic acids are NOT DETECTED.  The SARS-CoV-2 RNA is generally detectable in upper respiratory specimens during the acute phase of infection. The lowest concentration of SARS-CoV-2 viral copies this assay can  detect is 138 copies/mL. A negative result does not preclude SARS-Cov-2 infection and should not be used as the sole basis for treatment or other patient management decisions. A negative result may occur with  improper specimen collection/handling, submission of specimen other than nasopharyngeal swab, presence of viral mutation(s)  within the areas targeted by this assay, and inadequate number of viral copies(<138 copies/mL). A negative result must be combined with clinical observations, patient history, and epidemiological information. The expected result is Negative.  Fact Sheet for Patients:  EntrepreneurPulse.com.au  Fact Sheet for Healthcare Providers:  IncredibleEmployment.be  This test is no t yet approved or cleared by the [place] FDA and  has been authorized for detection and/or diagnosis of SARS-CoV-2 by FDA under an Emergency Use Authorization (EUA). This EUA will remain  in effect (meaning this test can be used) for the duration of the COVID-19 declaration under Section 564(b)(1) of the Act, 21 U.S.C.section 360bbb-3(b)(1), unless the authorization is terminated  or revoked sooner.       Influenza A by PCR NEGATIVE NEGATIVE Final   Influenza B by PCR NEGATIVE NEGATIVE Final    Comment: (NOTE) The Xpert Xpress SARS-CoV-2/FLU/RSV plus assay is intended as an aid in the diagnosis of influenza from Nasopharyngeal swab specimens and should not be used as a sole basis for treatment. Nasal washings and aspirates are unacceptable for Xpert Xpress SARS-CoV-2/FLU/RSV testing.  Fact Sheet for Patients: EntrepreneurPulse.com.au  Fact Sheet for Healthcare Providers: IncredibleEmployment.be  This test is not yet approved or cleared by the [place] FDA and has been authorized for detection and/or diagnosis of SARS-CoV-2 by FDA under an Emergency Use Authorization (EUA). This EUA will remain in  effect (meaning this test can be used) for the duration of the COVID-19 declaration under Section 564(b)(1) of the Act, 21 U.S.C. section 360bbb-3(b)(1), unless the authorization is terminated or revoked.  Performed at [facility], [place] [address], [place], [place] [address]   MRSA Next Gen by PCR, Nasal     Status: Abnormal   Collection Time: 06/10/21  8:46 AM   Specimen: Nasal Mucosa; Nasal Swab  Result Value Ref Range Status   MRSA by PCR Next Gen DETECTED (A) NOT DETECTED Final    Comment: RESULT CALLED TO, READ BACK BY AND VERIFIED WITH: Leonie Man, RN [id number] 06/10/21 KDS (NOTE) The GeneXpert MRSA Assay (FDA approved for NASAL specimens only), is one component of a comprehensive MRSA colonization surveillance program. It is not intended to diagnose MRSA infection nor to guide or monitor treatment for MRSA infections. Test performance is not FDA approved in patients less than 6 years old. Performed at [facility], [place] [address], [place], [place] [address]      Medications:    Chlorhexidine Gluconate Cloth  6 each Topical Daily   feeding supplement (PROSource TF)  45 mL Per Tube BID   insulin aspart  0-15 Units Subcutaneous Q4H   mouth rinse  15 mL Mouth Rinse q12n4p   mupirocin ointment  1 application Nasal BID   Continuous Infusions:  sodium chloride Stopped (06/10/21 1106)   ceFEPime (MAXIPIME) IV Stopped (06/11/21 2302)   dextrose 5% lactated ringers 50 mL/hr at 06/11/21 1500   famotidine (PEPCID) IV Stopped (06/11/21 2303)   pantoprazole 8 mg/hr (06/12/21 0036)      LOS: 5 days   Charlynne Cousins  [facility]  06/12/2021, 6:59 AM

## 2021-06-12 NOTE — Progress Notes (Signed)
UNASSIGNED PATIENT Subjective: Events over the weekend noted. Patient seems to be doing fairly well since the EGD with control of bleeding over large duodenal ulcer with a visible vessel.  He is tolerating a regular diet well denies having abdominal pain or nausea.  There has been no further problems with GI bleeding. His last CBC revealed a hemoglobin of 9.2 g/dL Objective: Vital signs in last 24 hours: Temp:  [97.5 F (36.4 C)-98.1 F (36.7 C)] 97.6 F (36.4 C) (08/15 0800) Pulse Rate:  [63-135] 135 (08/15 1235) Resp:  [13-22] 16 (08/15 0600) BP: (102-141)/(52-97) 122/64 (08/15 1100) SpO2:  [88 %-100 %] 98 % (08/15 1200) Last BM Date: 06/10/21  Intake/Output from previous day: 08/14 0701 - 08/15 0700 In: 2165 [I.V.:1515; IV Piggyback:650] Out: 3150 [Urine:3150] Intake/Output this shift: Total I/O In: 616.1 [I.V.:267.8; IV Piggyback:348.3] Out: -   General appearance: alert, cooperative, appears stated age, fatigued, and no distress Resp: clear to auscultation bilaterally Cardio: regular rate and rhythm, S1, S2 normal, no murmur, click, rub or gallop; he has a central line GI: soft, non-tender; bowel sounds normal; no masses,  no organomegaly Extremities: extremities normal, atraumatic, no cyanosis or edema  Lab Results: Recent Labs    06/10/21 0806 06/10/21 1519 06/11/21 0331 06/11/21 0950 06/11/21 2150 06/12/21 0330 06/12/21 0942  WBC 12.3*  --  19.1*  --   --  16.9*  --   HGB 4.0*   < > 9.9*   < > 9.7* 9.2* 9.2*  HCT 14.4*   < > 29.2*   < > 29.3* 28.0* 28.0*  PLT 233  --  187  --   --  174  --    < > = values in this interval not displayed.   BMET Recent Labs    06/10/21 0809 06/11/21 0329 06/12/21 0330  NA 141 143 141  K 4.3 3.4* 3.8  CL 113* 113* 115*  CO2 12* 23 23  GLUCOSE 222* 82 131*  BUN 36* 41* 32*  CREATININE 1.21 1.13 1.15  CALCIUM 6.9* 7.1* 7.3*   LFT Recent Labs    06/12/21 0330  PROT 4.3*  ALBUMIN 2.0*  AST 23  ALT 33  ALKPHOS 51   BILITOT 0.9   PT/INR Recent Labs    06/10/21 1519  LABPROT 17.8*  INR 1.5*   Studies/Results: ECHOCARDIOGRAM COMPLETE  Result Date: 06/11/2021    ECHOCARDIOGRAM REPORT   Patient Name:   [name]. Date of Exam: 06/11/2021 Medical Rec #:  [id number]           Height:       70.0 in Accession #:    [id number]          Weight:       182.8 lb Date of Birth:  Mar 15, 1941           BSA:          2.009 m Patient Age:    79 years            BP:           135/82 mmHg Patient Gender: M                   HR:           100 bpm. Exam Location:  Inpatient Procedure: 2D Echo, Color Doppler, Cardiac Doppler and Intracardiac            Opacification Agent Indications:    Cardiac arrest I46.9  History:        Patient has no prior history of Echocardiogram examinations.                 CAD, Arrythmias:First Degree AV block and RBBB; Risk                 Factors:Hypertension, Dyslipidemia and Sleep Apnea.  Sonographer:    Darlina Sicilian RDCS Referring Phys: [place]  1. Left ventricular ejection fraction, by estimation, is 55 to 60%. The left ventricle has normal function. The left ventricle has no regional wall motion abnormalities. Left ventricular diastolic function could not be evaluated.  2. Right ventricular systolic function is normal. The right ventricular size is not well visualized.  3. The mitral valve was not well visualized. Trivial mitral valve regurgitation. Moderate mitral annular calcification.  4. The aortic valve is tricuspid. There is moderate calcification of the aortic valve. Aortic valve regurgitation is not visualized. Mild to moderate aortic valve sclerosis/calcification is present, without any evidence of aortic stenosis. Comparison(s): No prior Echocardiogram. Conclusion(s)/Recommendation(s): Limited windows, only subcostal images interpretable, even with use of echo contrast. Grossly normal LV and RV function without apparent severe valve disease, but cannot  exclude mild abnormalities given limited windows. FINDINGS  Left Ventricle: Left ventricular ejection fraction, by estimation, is 55 to 60%. The left ventricle has normal function. The left ventricle has no regional wall motion abnormalities. Definity contrast agent was given IV to delineate the left ventricular  endocardial borders. The left ventricular internal cavity size was normal in size. There is no left ventricular hypertrophy. Left ventricular diastolic function could not be evaluated. Right Ventricle: The right ventricular size is not well visualized. Right vetricular wall thickness was not well visualized. Right ventricular systolic function is normal. Left Atrium: Left atrial size was not well visualized. Right Atrium: Right atrial size was not well visualized. Pericardium: There is no evidence of pericardial effusion. Mitral Valve: The mitral valve was not well visualized. Moderate mitral annular calcification. Trivial mitral valve regurgitation. Tricuspid Valve: The tricuspid valve is not well visualized. Tricuspid valve regurgitation is trivial. No evidence of tricuspid stenosis. Aortic Valve: The aortic valve is tricuspid. There is moderate calcification of the aortic valve. Aortic valve regurgitation is not visualized. Mild to moderate aortic valve sclerosis/calcification is present, without any evidence of aortic stenosis. Pulmonic Valve: The pulmonic valve was not well visualized. Pulmonic valve regurgitation is not visualized. No evidence of pulmonic stenosis. Aorta: The aortic root was not well visualized, the ascending aorta was not well visualized and the aortic arch was not well visualized. Venous: IVC assessment for right atrial pressure unable to be performed due to mechanical ventilation. IAS/Shunts: The atrial septum is grossly normal. Buford Dresser MD Electronically signed by Buford Dresser MD Signature Date/Time: 06/11/2021/2:59:45 PM    Final     Medications: I have  reviewed the patient's current medications.  Assessment/Plan: 1) Large duodenal bulb ulcer with a visible vessel leading to hemorrhagic shock-tests status post EGD with hemostasis after endoscopic intervention. Currently stable without any further evidence of bleeding. Continue present care with serial CBC's. 2) Acute respiratory failure requiring intubation due to encephalopathy on 06/10/2021 extubated on 06/11/2021. 3) Sepsis due to Enterobacter cloacae UTI postoperatively after surgical intervention. 4) CAD/AODM/OSA. 5) History of bladder cancer.  LOS: 5 days   Juanita Craver 06/12/2021, 12:36 PM

## 2021-06-13 ENCOUNTER — Encounter (HOSPITAL_COMMUNITY): Payer: Self-pay | Admitting: Internal Medicine

## 2021-06-13 DIAGNOSIS — K269 Duodenal ulcer, unspecified as acute or chronic, without hemorrhage or perforation: Secondary | ICD-10-CM | POA: Diagnosis not present

## 2021-06-13 DIAGNOSIS — A419 Sepsis, unspecified organism: Secondary | ICD-10-CM | POA: Diagnosis not present

## 2021-06-13 DIAGNOSIS — R6510 Systemic inflammatory response syndrome (SIRS) of non-infectious origin without acute organ dysfunction: Secondary | ICD-10-CM | POA: Diagnosis not present

## 2021-06-13 DIAGNOSIS — D62 Acute posthemorrhagic anemia: Secondary | ICD-10-CM | POA: Diagnosis not present

## 2021-06-13 LAB — HEMOGLOBIN AND HEMATOCRIT, BLOOD
HCT: 26.3 % — ABNORMAL LOW (ref 39.0–52.0)
HCT: 27.7 % — ABNORMAL LOW (ref 39.0–52.0)
HCT: 30.3 % — ABNORMAL LOW (ref 39.0–52.0)
HCT: 27.8 % — ABNORMAL LOW (ref 39.0–52.0)
Hemoglobin: 8.6 g/dL — ABNORMAL LOW (ref 13.0–17.0)
Hemoglobin: 9.1 g/dL — ABNORMAL LOW (ref 13.0–17.0)
Hemoglobin: 9.7 g/dL — ABNORMAL LOW (ref 13.0–17.0)
Hemoglobin: 9 g/dL — ABNORMAL LOW (ref 13.0–17.0)

## 2021-06-13 LAB — GLUCOSE, CAPILLARY
Glucose-Capillary: 101 mg/dL — ABNORMAL HIGH (ref 70–99)
Glucose-Capillary: 109 mg/dL — ABNORMAL HIGH (ref 70–99)
Glucose-Capillary: 114 mg/dL — ABNORMAL HIGH (ref 70–99)
Glucose-Capillary: 129 mg/dL — ABNORMAL HIGH (ref 70–99)
Glucose-Capillary: 172 mg/dL — ABNORMAL HIGH (ref 70–99)
Glucose-Capillary: 98 mg/dL (ref 70–99)

## 2021-06-13 LAB — PHOSPHORUS: Phosphorus: 2.1 mg/dL — ABNORMAL LOW (ref 2.5–4.6)

## 2021-06-13 LAB — SURGICAL PATHOLOGY

## 2021-06-13 MED ORDER — INSULIN DETEMIR 100 UNIT/ML ~~LOC~~ SOLN
5.0000 [IU] | Freq: Every day | SUBCUTANEOUS | Status: DC
  Administered 2021-06-13 – 2021-06-19 (×7): 5 [IU] via SUBCUTANEOUS
  Filled 2021-06-13 (×7): qty 0.05

## 2021-06-13 MED ORDER — INSULIN ASPART 100 UNIT/ML IJ SOLN
2.0000 [IU] | Freq: Three times a day (TID) | INTRAMUSCULAR | Status: DC
  Administered 2021-06-13 – 2021-06-19 (×11): 2 [IU] via SUBCUTANEOUS

## 2021-06-13 MED ORDER — SODIUM CHLORIDE 0.9 % IV SOLN: INTRAVENOUS | Status: DC

## 2021-06-13 MED ORDER — INSULIN ASPART 100 UNIT/ML IJ SOLN: 0.0000 [IU] | Freq: Every day | INTRAMUSCULAR | Status: DC

## 2021-06-13 MED ORDER — PANTOPRAZOLE SODIUM 40 MG IV SOLR
40.0000 mg | Freq: Two times a day (BID) | INTRAVENOUS | Status: DC
  Administered 2021-06-13 – 2021-06-14 (×3): 40 mg via INTRAVENOUS
  Filled 2021-06-13 (×2): qty 40

## 2021-06-13 MED ORDER — INSULIN ASPART 100 UNIT/ML IJ SOLN
0.0000 [IU] | Freq: Three times a day (TID) | INTRAMUSCULAR | Status: DC
  Administered 2021-06-13: 2 [IU] via SUBCUTANEOUS
  Administered 2021-06-14: 1 [IU] via SUBCUTANEOUS
  Administered 2021-06-15: 2 [IU] via SUBCUTANEOUS
  Administered 2021-06-15 – 2021-06-19 (×3): 1 [IU] via SUBCUTANEOUS

## 2021-06-13 NOTE — Progress Notes (Signed)
Subjective: No complaints.  Feeling well.  Objective: Vital signs in last 24 hours: Temp:  [97.6 F (36.4 C)-98.4 F (36.9 C)] 98.4 F (36.9 C) (08/16 0400) Pulse Rate:  [50-135] 55 (08/16 0300) Resp:  [6-18] 18 (08/16 0400) BP: (103-141)/(57-116) 120/61 (08/16 0300) SpO2:  [94 %-100 %] 95 % (08/16 0300) Last BM Date: 06/12/21  Intake/Output from previous day: 08/15 0701 - 08/16 0700 In: 1727.6 [I.V.:1320.9; IV Piggyback:406.7] Out: 2225 [Urine:2225] Intake/Output this shift: Total I/O In: 1111.5 [I.V.:1053.1; IV Piggyback:58.4] Out: 1400 [Urine:1400]  General appearance: alert and no distress GI: soft, non-tender; bowel sounds normal; no masses,  no organomegaly  Lab Results: Recent Labs    06/10/21 0806 06/10/21 1519 06/11/21 0331 06/11/21 0950 06/12/21 0330 06/12/21 0942 06/12/21 1550 06/13/21 0350  WBC 12.3*  --  19.1*  --  16.9*  --   --   --   HGB 4.0*   < > 9.9*   < > 9.2* 9.2* 9.6* 8.6*  HCT 14.4*   < > 29.2*   < > 28.0* 28.0* 29.4* 26.3*  PLT 233  --  187  --  174  --   --   --    < > = values in this interval not displayed.   BMET Recent Labs    06/10/21 0809 06/11/21 0329 06/12/21 0330  NA 141 143 141  K 4.3 3.4* 3.8  CL 113* 113* 115*  CO2 12* 23 23  GLUCOSE 222* 82 131*  BUN 36* 41* 32*  CREATININE 1.21 1.13 1.15  CALCIUM 6.9* 7.1* 7.3*   LFT Recent Labs    06/12/21 0330  PROT 4.3*  ALBUMIN 2.0*  AST 23  ALT 33  ALKPHOS 51  BILITOT 0.9   PT/INR Recent Labs    06/10/21 1519  LABPROT 17.8*  INR 1.5*   Hepatitis Panel No results for input(s): HEPBSAG, HCVAB, HEPAIGM, HEPBIGM in the last 72 hours. C-Diff No results for input(s): CDIFFTOX in the last 72 hours. Fecal Lactopherrin No results for input(s): FECLLACTOFRN in the last 72 hours.  Studies/Results: ECHOCARDIOGRAM COMPLETE  Result Date: 06/11/2021    ECHOCARDIOGRAM REPORT   Patient Name:   [name]. Date of Exam: 06/11/2021 Medical Rec #:  [id number]            Height:       70.0 in Accession #:    [id number]          Weight:       182.8 lb Date of Birth:  11-22-1940           BSA:          2.009 m Patient Age:    80 years            BP:           135/82 mmHg Patient Gender: M                   HR:           100 bpm. Exam Location:  Inpatient Procedure: 2D Echo, Color Doppler, Cardiac Doppler and Intracardiac            Opacification Agent Indications:    Cardiac arrest I46.9  History:        Patient has no prior history of Echocardiogram examinations.                 CAD, Arrythmias:First Degree AV block and RBBB; Risk  Factors:Hypertension, Dyslipidemia and Sleep Apnea.  Sonographer:    Darlina Sicilian RDCS Referring Phys: [place]  1. Left ventricular ejection fraction, by estimation, is 55 to 60%. The left ventricle has normal function. The left ventricle has no regional wall motion abnormalities. Left ventricular diastolic function could not be evaluated.  2. Right ventricular systolic function is normal. The right ventricular size is not well visualized.  3. The mitral valve was not well visualized. Trivial mitral valve regurgitation. Moderate mitral annular calcification.  4. The aortic valve is tricuspid. There is moderate calcification of the aortic valve. Aortic valve regurgitation is not visualized. Mild to moderate aortic valve sclerosis/calcification is present, without any evidence of aortic stenosis. Comparison(s): No prior Echocardiogram. Conclusion(s)/Recommendation(s): Limited windows, only subcostal images interpretable, even with use of echo contrast. Grossly normal LV and RV function without apparent severe valve disease, but cannot exclude mild abnormalities given limited windows. FINDINGS  Left Ventricle: Left ventricular ejection fraction, by estimation, is 55 to 60%. The left ventricle has normal function. The left ventricle has no regional wall motion abnormalities. Definity contrast agent was given IV to  delineate the left ventricular  endocardial borders. The left ventricular internal cavity size was normal in size. There is no left ventricular hypertrophy. Left ventricular diastolic function could not be evaluated. Right Ventricle: The right ventricular size is not well visualized. Right vetricular wall thickness was not well visualized. Right ventricular systolic function is normal. Left Atrium: Left atrial size was not well visualized. Right Atrium: Right atrial size was not well visualized. Pericardium: There is no evidence of pericardial effusion. Mitral Valve: The mitral valve was not well visualized. Moderate mitral annular calcification. Trivial mitral valve regurgitation. Tricuspid Valve: The tricuspid valve is not well visualized. Tricuspid valve regurgitation is trivial. No evidence of tricuspid stenosis. Aortic Valve: The aortic valve is tricuspid. There is moderate calcification of the aortic valve. Aortic valve regurgitation is not visualized. Mild to moderate aortic valve sclerosis/calcification is present, without any evidence of aortic stenosis. Pulmonic Valve: The pulmonic valve was not well visualized. Pulmonic valve regurgitation is not visualized. No evidence of pulmonic stenosis. Aorta: The aortic root was not well visualized, the ascending aorta was not well visualized and the aortic arch was not well visualized. Venous: IVC assessment for right atrial pressure unable to be performed due to mechanical ventilation. IAS/Shunts: The atrial septum is grossly normal. Buford Dresser MD Electronically signed by Buford Dresser MD Signature Date/Time: 06/11/2021/2:59:45 PM    Final     Medications: Scheduled:  Chlorhexidine Gluconate Cloth  6 each Topical Daily   feeding supplement (PROSource TF)  45 mL Per Tube BID   insulin aspart  0-15 Units Subcutaneous Q4H   mouth rinse  15 mL Mouth Rinse q12n4p   mupirocin ointment  1 application Nasal BID   Continuous:  sodium chloride  Stopped (06/10/21 1106)   ceFEPime (MAXIPIME) IV Stopped (06/12/21 2247)   dextrose 5% lactated ringers 50 mL/hr at 06/13/21 0500   famotidine (PEPCID) IV Stopped (06/12/21 2257)   pantoprazole 8 mg/hr (06/13/21 0330)    Assessment/Plan: 1) Bleeding duodenal ulcer s/p BICAP. 2) Anemia.   There is a mild drop in his HGB.  Overall he is feeling well and there are no reports of melena or hematochezia.  His gastric biopsies are still pending.  If positive for H. Pylori he will need to be treated.  Plan: 1) Follow HGB. 2) Transfuse as necessary. 3) Await biopsy results.  LOS: 6  days   Arlesia Kiel D 06/13/2021, 6:38 AM

## 2021-06-13 NOTE — Progress Notes (Signed)
Pt. rt forearm is swollen and red, IVF discontinued. Rt. Arm elavated with pillow. MD notified.Will monitor closely.

## 2021-06-13 NOTE — Progress Notes (Signed)
Nutrition Follow-up  DOCUMENTATION CODES:   Non-severe (moderate) malnutrition in context of chronic illness  INTERVENTION:  - continue Hormel Shake BID and Magic Cup BID. - encourage family to bring in favorite foods.   NUTRITION DIAGNOSIS:   Moderate Malnutrition related to chronic illness, cancer and cancer related treatments as evidenced by mild fat depletion, moderate fat depletion, mild muscle depletion, moderate muscle depletion. -ongoing  GOAL:   Patient will meet greater than or equal to 90% of their needs -unmet on average   MONITOR:   PO intake, Supplement acceptance, Labs, Weight trends, Skin, I & O's   ASSESSMENT:   80 yo male with a PMH of bladder cancer s/p cystectomy, prostatectomy and ileal conduit surgery on July 22, coronary artery disease, hyperlipidemia, essential hypertension, obstructive sleep apnea, Tourette's syndrome among other things who was brought in today secondary to progressive weakness failure to thrive at home and generalized malaise since his surgery.  Per family he has been declining with poor oral intake.  Patient was noted to be dehydrated.  Received fluid resuscitation in the ER.  Still lethargic and weak.  Urinalysis not impressive for a UTI but he meets SIRS criteria.  The last documented meal intakes were 10% of breakfast, 25% of lunch, 10% of dinner on 8/11. Patient was intubated on 8/13 and extubated on 8/14. Diet advanced from NPO to Regular on 8/14 at 1516.  He was intubated d/t agitation and was then found to have massive GIB and be in shock.  Patient laying in bed with son at bedside. Son reports that patient has had ongoing lack of taste sensation since surgery on 7/22. Discussed possible reasons for this.   He has mainly been eating bites at meals although starting to eat more over the past 2 days. He has been drinking Hormel Shakes each time he receives them. Each shake is 500 kcal and 22 grams protein. He does not care for the  taste but will hold his nose and drink them as he understands the importance of nutrition in healing and being able to be discharged.  He has not been weighed since 8/14. Weight had fluctuated daily since admission. Moderate pitting edema to BLE documented in the flow sheet. He is noted to be +7.1 L since admission.   Per notes: - no plan for intervention by Urology at this time - sepsis at the time of admission d/t UTI - hemorrhagic shock 2/2 GIB--s/p 4 units PRBCs. - hx of bladder cancer and s/p TURBT in the past - hx of type 2 DM with most recent HgbA1c of 6.5%   Labs reviewed; CBGs: 114, 109, 172 mg/dl, Cl: 115 mmol/l, BUN: 32 mg/dl, Ca: 7.3 mg/dl, Phos: 2.1 mg/dl. Medications reviewed; 20 mg IV pepcid BID, sliding scale novolog, 2 units novolog TID, 5 units levemir/day, 40 mg IV protonix BID.  IVF; D5-LR @ 50 ml/hr (204 kcal/24 hrs).   Diet Order:   Diet Order             Diet regular Room service appropriate? Yes with Assist; Fluid consistency: Thin  Diet effective now                   EDUCATION NEEDS:   Education needs have been addressed  Skin:  Skin Assessment: Skin Integrity Issues: Skin Integrity Issues:: Incisions Incisions: Abdomen, closed  Last BM:  8/15  Height:   Ht Readings from Last 1 Encounters:  06/07/21 '5\' 10"'$  (1.778 m)    Weight:  Wt Readings from Last 1 Encounters:  06/11/21 82.9 kg     Estimated Nutritional Needs:  Kcal:  1900-2100 kcal Protein:  100-115 grams Fluid:  >/= 1.7 L/day     Jarome Matin, MS, RD, LDN, CNSC Inpatient Clinical Dietitian RD pager # available in [facility]  After hours/weekend pager # available in [facility]

## 2021-06-13 NOTE — Progress Notes (Addendum)
Urology Progress Note   80 y.o. male with history of bladder cancer which was muscle invasive upon TURBT with left-sided hydronephrosis upstaging it to T3 who is status post radical cystectomy and ileal conduit urinary diversion on 05/19/2021 with final pathology of pT1a.  He was discharged 6 days postoperatively after a relatively uncomplicated hospital course after return of bowel function was achieved.  He presents to the emergency room with failure to thrive at home.  Found to have a white blood cell count of 15, hypotensive.  He was afebrile and his creatinine was within normal limits.  He was just dated adequately.  Urine culture has revealed gram-negative rods.  Sensitivities and susceptibilities and speciation are pending.   Initial CT imaging on 06/07/2021 revealed mild right hydronephrosis and moderate left hydronephrosis.  Ureteral stents are adequately positioned.  He is making good urine function and his creatinine is stable.  Renal ultrasound on hospital day 1 revealed resolved right hydronephrosis and mild left hydronephrosis.  This is stable compared to his prior imaging before his surgery.  06/10/21: Acute Hb dorp to 4.0 with BRBPR. Resuscitated and intubated and started on pressors and transferred to ICU. S/p EGD with GI at bedside revealing bleeding duodenal ulcer. Fulgurated.   Interval: Moral and energey much improved. Normotensive and afebrile. Hb 8.6 from 9.6. Being treated for Enterobacter UTI. Also on famotidine. Awaiting H Pylori results. Urine remains clear yellow  Objective: Vital signs in last 24 hours: Temp:  [97.6 F (36.4 C)-98.4 F (36.9 C)] 98.4 F (36.9 C) (08/16 0400) Pulse Rate:  [50-135] 55 (08/16 0300) Resp:  [6-18] 18 (08/16 0400) BP: (103-141)/(57-116) 120/61 (08/16 0300) SpO2:  [94 %-100 %] 95 % (08/16 0300)  Intake/Output from previous day: 08/15 0701 - 08/16 0700 In: 1727.6 [I.V.:1320.9; IV Piggyback:406.7] Out: 2225 [Urine:2225] Intake/Output this  shift: No intake/output data recorded.  Physical Exam:  General: Alert, oriented, conversant CV: Regular rate Lungs: Breathing comfortably Abdomen: Abdomen is soft.  Ileostomy is pink and healthy-appearing with ureteral stents in place.  Clear yellow urine output. Ext: NT, No erythema  Lab Results: Recent Labs    06/12/21 0942 06/12/21 1550 06/13/21 0350  HGB 9.2* 9.6* 8.6*  HCT 28.0* 29.4* 26.3*    Recent Labs    06/11/21 0329 06/12/21 0330  NA 143 141  K 3.4* 3.8  CL 113* 115*  CO2 23 23  GLUCOSE 82 131*  BUN 41* 32*  CREATININE 1.13 1.15  CALCIUM 7.1* 7.3*     Studies/Results: ECHOCARDIOGRAM COMPLETE  Result Date: 06/11/2021    ECHOCARDIOGRAM REPORT   Patient Name:   Svend Wilbur. Date of Exam: 06/11/2021 Medical Rec #:  [place]:[id number]           Height:       70.0 in Accession #:    [id number]          Weight:       182.8 lb Date of Birth:  [birth year]           BSA:          2.009 m Patient Age:    87 years            BP:           135/82 mmHg Patient Gender: M                   HR:           100 bpm. Exam Location:  Inpatient Procedure:  2D Echo, Color Doppler, Cardiac Doppler and Intracardiac            Opacification Agent Indications:    Cardiac arrest I46.9  History:        Patient has no prior history of Echocardiogram examinations.                 CAD, Arrythmias:First Degree AV block and RBBB; Risk                 Factors:Hypertension, Dyslipidemia and Sleep Apnea.  Sonographer:    Darlina Sicilian RDCS Referring Phys: [place]  1. Left ventricular ejection fraction, by estimation, is 55 to 60%. The left ventricle has normal function. The left ventricle has no regional wall motion abnormalities. Left ventricular diastolic function could not be evaluated.  2. Right ventricular systolic function is normal. The right ventricular size is not well visualized.  3. The mitral valve was not well visualized. Trivial mitral valve regurgitation. Moderate  mitral annular calcification.  4. The aortic valve is tricuspid. There is moderate calcification of the aortic valve. Aortic valve regurgitation is not visualized. Mild to moderate aortic valve sclerosis/calcification is present, without any evidence of aortic stenosis. Comparison(s): No prior Echocardiogram. Conclusion(s)/Recommendation(s): Limited windows, only subcostal images interpretable, even with use of echo contrast. Grossly normal LV and RV function without apparent severe valve disease, but cannot exclude mild abnormalities given limited windows. FINDINGS  Left Ventricle: Left ventricular ejection fraction, by estimation, is 55 to 60%. The left ventricle has normal function. The left ventricle has no regional wall motion abnormalities. Definity contrast agent was given IV to delineate the left ventricular  endocardial borders. The left ventricular internal cavity size was normal in size. There is no left ventricular hypertrophy. Left ventricular diastolic function could not be evaluated. Right Ventricle: The right ventricular size is not well visualized. Right vetricular wall thickness was not well visualized. Right ventricular systolic function is normal. Left Atrium: Left atrial size was not well visualized. Right Atrium: Right atrial size was not well visualized. Pericardium: There is no evidence of pericardial effusion. Mitral Valve: The mitral valve was not well visualized. Moderate mitral annular calcification. Trivial mitral valve regurgitation. Tricuspid Valve: The tricuspid valve is not well visualized. Tricuspid valve regurgitation is trivial. No evidence of tricuspid stenosis. Aortic Valve: The aortic valve is tricuspid. There is moderate calcification of the aortic valve. Aortic valve regurgitation is not visualized. Mild to moderate aortic valve sclerosis/calcification is present, without any evidence of aortic stenosis. Pulmonic Valve: The pulmonic valve was not well visualized. Pulmonic  valve regurgitation is not visualized. No evidence of pulmonic stenosis. Aorta: The aortic root was not well visualized, the ascending aorta was not well visualized and the aortic arch was not well visualized. Venous: IVC assessment for right atrial pressure unable to be performed due to mechanical ventilation. IAS/Shunts: The atrial septum is grossly normal. Buford Dresser MD Electronically signed by Buford Dresser MD Signature Date/Time: 06/11/2021/2:59:45 PM    Final     Assessment:   80 y.o. male with history of bladder cancer which was muscle invasive upon TURBT with left-sided hydronephrosis upstaging it to T3 who is status post radical cystectomy and ileal conduit urinary diversion on 05/19/2021 with final pathology of pT1a.  He was discharged 6 days postoperatively after a relatively uncomplicated hospital course after return of bowel function was achieved.  He presents to the emergency room with failure to thrive at home.  Found to have a white  blood cell count of 15, hypotensive.  He was afebrile and his creatinine was within normal limits.  He was just dated adequately.  Urine culture has Enterobacter cloacae.  Acute drop in hemoglobin with bright red blood per rectum on 06/10/2021.  Status post ICU transfer, resuscitation with blood products and IV fluids, vasopressors, intubation.  Bedside endoscopy revealed bleeding duodenal ulcer which was fulgurated by the gastroenterology team.  Hemoglobin has responded.  Patient is now weaned off of vasopressors.  Assessment: History of bladder cancer status post cystoprostatectomy with ileal conduit Acute blood loss anemia with hemorrhagic shock secondary to upper GI bleed  Recommendations: - no urologic intervention. Agree with Uti treatment,H Pylori treatment if indicated, Hb monitoring per primary team, nutrition optimization.  -Continue ureteral stents within the urostomy bag to drainage -Recommend wound ostomy nurse consultation for  ostomy care -No urological intervention necessary at this time    LOS: 6 days    I have seen and examined the patient and agree with Dr. Charlean Sanfilippo plan.  Briefly,  S: 1- Bladder Cancer - s/p cystoprostatectomy and urinary diversion (conduit) 04/2021 for aggressive bladder cancer.  2 - Acute Bloodloss Anemia / Upper GI Ulcer - acute upper GI bleed with symptomatic anemia at presentation. Known GERD history, has had recent signficant physiologic stress with major surgery last month.  O: NAD, AOx3, pleasant, at baseline, son in room Non-labored breathing on minimal [place] O2 HR 70s by monitor SNTND, recent surgical scars c/d/I, no hernias.  Copious non-foul urine and scant mucus in RLQ Urostomy appliance with distal stents in situ SCD's in place  Recent CT with expected bilateral mild caliectasis, stents in good position, no delayed nephrogram.  A/P:  1- Bladder Cancer - very good prognosis. NO additional specific therapy or tube manipulation this admission.   2 - Acute Bloodloss Anemia / Upper GI Ulcer - GREATLY appreciate GI, Cardiology, Hospitalist teams urgent management of significant GI Bleed. Hgb now stabilizing. His recent phsiologic stress with major surgery certainly may have contributed to risk indirectly.   We will verify outpatient follow up for further management of his bladder cancer and healing after major surgery last month.  Please call me directly with questions ANYtime.

## 2021-06-13 NOTE — Progress Notes (Signed)
[facility] PROGRESS NOTE    Progress Note  [name].  [id number] DOB: 09-05-1941 DOA: 06/07/2021 PCP: Celene Squibb, MD     Brief Narrative:   [name]. is an 80 y.o. male past medical history significant for bladder cancer status post cystectomy and prostatectomy with an ileal conduit surgery with stent placement on 05/19/2021 coronary artery disease essential hypertension obstructive sleep apnea, Tourette's syndrome remained to the hospital for generalized weakness and fatigue postsurgery with a significant decline in his functional ability and poor oral intake on admission on 06/07/2021 he was found to be in SIRS, he was progressing well but on 06/09/2021 found to have a hemoglobin of 9.6 melanotic stools GI was consulted,  overnight became agitated and confused had to be emergently intubated at 06/10/2021 probably due to hemorrhagic shock was found to have a hemoglobin of 4 . EGD was performed on 06/10/2021 that showed bleeding duodenal bulb ulcer with visible vessel with bleeding status post endoscopic homeostasis.    Significant Events: 06/07/2021 - admit 8/13 - agitation, intubation, massive gi bleed, shock -> scope DU ucler and s;p epi (on ppi gtt and pepcid). S/p 4 U  PRBC    Assessment/Plan:   Sepsis due to Enterobacter cloacae UTI in the setting of postoperative surgical intervention: Urine culture on admission show Enterobacter, he was initially started on Rocephin which was changed to IV cefepime which we will continue for total of 7 days.  Starting date 06/10/2021. Blood cultures remain negative till date. He is having good urine output renal ultrasound day 1 showed resolved right hydronephrosis. Urology on board. Physical therapy has been consulted they recommended skilled nursing facility. Discontinue central line  Acute respiratory failure requiring intubation due to encephalopathy on 06/10/2021: Extubated on 06/11/2021 Encephalopathy resolved  likely due to hemorrhagic shock. Now about even after IV Lasix.  Hemorrhagic shock: Required intubation status post 4 units of packed red blood cells. EGD performed showed an active bleeding ulcer which was endoscopically cauterized Require pressors temporarily. Appreciate GIs assistance transition IV Protonix to twice a day. Continue to check hemoglobin regulates has been a mild drop, he relates no signs of bleeding.  History of coronary artery disease: Cardiac biomarkers remain flat, echo was done that showed an EF of 55% right ventricular function was preserved no significant valve abnormalities.  History of right bundle branch block and first-degree AV block: In sinus rhythm  Diabetes mellitus type 2: With an A1c of 6.5: Blood glucose fairly controlled continue sliding scale.  OSA (obstructive sleep apnea) Continue CPAP at night.  Bladder cancer [facility]) Follow-up with urology as an outpatient.   DVT prophylaxis: scd Family Communication:none Status is: Inpatient  Remains inpatient appropriate because:Hemodynamically unstable  Dispo: The patient is from: Home              Anticipated d/c is to: SNF              Patient currently is not medically stable to d/c.   Difficult to place patient No    Code Status:     Code Status Orders  (From admission, onward)           Start     Ordered   06/07/21 2352  Full code  Continuous        06/07/21 2351           Code Status History     Date Active Date Inactive Code Status Order ID Comments User Context  05/19/2021 2034 05/25/2021 1720 Full Code [id number]  Lattie Corns Inpatient   02/03/2021 1550 02/05/2021 1648 Full Code [id number]  Irine Seal, MD Inpatient   01/17/2021 1537 01/18/2021 1724 Full Code [id number]  Irine Seal, MD Inpatient         IV Access:   Peripheral IV   Procedures and diagnostic studies:   ECHOCARDIOGRAM COMPLETE  Result Date: 06/11/2021    ECHOCARDIOGRAM REPORT   Patient  Name:   Marq Milic. Date of Exam: 06/11/2021 Medical Rec #:  [id number]           Height:       70.0 in Accession #:    [id number]          Weight:       182.8 lb Date of Birth:  1941-10-18           BSA:          2.009 m Patient Age:    18 years            BP:           135/82 mmHg Patient Gender: M                   HR:           100 bpm. Exam Location:  Inpatient Procedure: 2D Echo, Color Doppler, Cardiac Doppler and Intracardiac            Opacification Agent Indications:    Cardiac arrest I46.9  History:        Patient has no prior history of Echocardiogram examinations.                 CAD, Arrythmias:First Degree AV block and RBBB; Risk                 Factors:Hypertension, Dyslipidemia and Sleep Apnea.  Sonographer:    Darlina Sicilian RDCS Referring Phys: [place]  1. Left ventricular ejection fraction, by estimation, is 55 to 60%. The left ventricle has normal function. The left ventricle has no regional wall motion abnormalities. Left ventricular diastolic function could not be evaluated.  2. Right ventricular systolic function is normal. The right ventricular size is not well visualized.  3. The mitral valve was not well visualized. Trivial mitral valve regurgitation. Moderate mitral annular calcification.  4. The aortic valve is tricuspid. There is moderate calcification of the aortic valve. Aortic valve regurgitation is not visualized. Mild to moderate aortic valve sclerosis/calcification is present, without any evidence of aortic stenosis. Comparison(s): No prior Echocardiogram. Conclusion(s)/Recommendation(s): Limited windows, only subcostal images interpretable, even with use of echo contrast. Grossly normal LV and RV function without apparent severe valve disease, but cannot exclude mild abnormalities given limited windows. FINDINGS  Left Ventricle: Left ventricular ejection fraction, by estimation, is 55 to 60%. The left ventricle has normal function. The left  ventricle has no regional wall motion abnormalities. Definity contrast agent was given IV to delineate the left ventricular  endocardial borders. The left ventricular internal cavity size was normal in size. There is no left ventricular hypertrophy. Left ventricular diastolic function could not be evaluated. Right Ventricle: The right ventricular size is not well visualized. Right vetricular wall thickness was not well visualized. Right ventricular systolic function is normal. Left Atrium: Left atrial size was not well visualized. Right Atrium: Right atrial size was not well visualized. Pericardium: There is no evidence of pericardial effusion. Mitral Valve: The mitral valve was  not well visualized. Moderate mitral annular calcification. Trivial mitral valve regurgitation. Tricuspid Valve: The tricuspid valve is not well visualized. Tricuspid valve regurgitation is trivial. No evidence of tricuspid stenosis. Aortic Valve: The aortic valve is tricuspid. There is moderate calcification of the aortic valve. Aortic valve regurgitation is not visualized. Mild to moderate aortic valve sclerosis/calcification is present, without any evidence of aortic stenosis. Pulmonic Valve: The pulmonic valve was not well visualized. Pulmonic valve regurgitation is not visualized. No evidence of pulmonic stenosis. Aorta: The aortic root was not well visualized, the ascending aorta was not well visualized and the aortic arch was not well visualized. Venous: IVC assessment for right atrial pressure unable to be performed due to mechanical ventilation. IAS/Shunts: The atrial septum is grossly normal. Buford Dresser MD Electronically signed by Buford Dresser MD Signature Date/Time: 06/11/2021/2:59:45 PM    Final      Medical Consultants:   None.   Subjective:    [name]. feels better tolerating his diet.  No signs of bleeding.  Objective:    Vitals:   06/13/21 0100 06/13/21 0200 06/13/21 0300  06/13/21 0400  BP: (!) 113/57 123/63 120/61   Pulse: 80 76 (!) 55   Resp:  (!) '6 18 18  '$ Temp:    98.4 F (36.9 C)  TempSrc:    Oral  SpO2: 96% 98% 95%   Weight:      Height:       SpO2: 95 % O2 Flow Rate (L/min): 2 L/min FiO2 (%): 40 %   Intake/Output Summary (Last 24 hours) at 06/13/2021 0640 Last data filed at 06/13/2021 0500 Gross per 24 hour  Intake 1727.58 ml  Output 2225 ml  Net -497.42 ml    Filed Weights   06/09/21 0430 06/10/21 1400 06/11/21 0500  Weight: 79.3 kg 88.7 kg 82.9 kg    Exam: General exam: In no acute distress. Respiratory system: Good air movement and clear to auscultation. Cardiovascular system: S1 & S2 heard, RRR. No JVD. Gastrointestinal system: Abdomen is nondistended, soft and nontender.  Extremities: No pedal edema. Skin: No rashes, lesions or ulcers  Data Reviewed:    Labs: Basic Metabolic Panel: Recent Labs  Lab 06/07/21 1821 06/08/21 0426 06/09/21 0436 06/10/21 0809 06/11/21 0329 06/12/21 0330 06/13/21 0350  NA  --  136 141 141 143 141  --   K  --  3.8 3.8 4.3 3.4* 3.8  --   CL  --  104 107 113* 113* 115*  --   CO2  --  25 25 12* 23 23  --   GLUCOSE  --  149* 136* 222* 82 131*  --   BUN  --  28* 22 36* 41* 32*  --   CREATININE  --  0.84 0.84 1.21 1.13 1.15  --   CALCIUM  --  8.5* 8.4* 6.9* 7.1* 7.3*  --   MG 1.7  --   --  1.7 2.1 1.9  --   PHOS  --   --   --  4.4 2.6 2.9 2.1*    GFR Estimated Creatinine Clearance: 51.1 mL/min (by C-G formula based on SCr of 1.15 mg/dL). Liver Function Tests: Recent Labs  Lab 06/08/21 0426 06/09/21 0436 06/10/21 0809 06/11/21 0329 06/12/21 0330  AST '27 23 27 '$ 40 23  ALT 35 34 22 42 33  ALKPHOS 78 75 43 52 51  BILITOT 0.2* 0.6 0.4 0.9 0.9  PROT 5.7* 5.7* 3.4* 4.0* 4.3*  ALBUMIN 2.1* 2.2* 1.4*  1.8* 2.0*    Recent Labs  Lab 06/10/21 0809  LIPASE 59*    Recent Labs  Lab 06/07/21 1822  AMMONIA 14    Coagulation profile Recent Labs  Lab 06/07/21 1752 06/10/21 1519   INR 1.1 1.5*    COVID-19 Labs  No results for input(s): DDIMER, FERRITIN, LDH, CRP in the last 72 hours.  Lab Results  Component Value Date   [place] NEGATIVE 06/07/2021   [place] NEGATIVE 05/18/2021   [place] NEGATIVE 02/01/2021   [place] NEGATIVE 01/13/2021    CBC: Recent Labs  Lab 06/07/21 1752 06/08/21 0426 06/09/21 0436 06/10/21 0806 06/10/21 1519 06/11/21 0331 06/11/21 0950 06/11/21 2150 06/12/21 0330 06/12/21 0942 06/12/21 1550 06/13/21 0350  WBC 15.0* 11.1* 10.3 12.3*  --  19.1*  --   --  16.9*  --   --   --   NEUTROABS 11.9*  --   --   --   --   --   --   --   --   --   --   --   HGB 11.6* 9.7* 9.6* 4.0*   < > 9.9*   < > 9.7* 9.2* 9.2* 9.6* 8.6*  HCT 37.2* 30.0* 30.9* 14.4*   < > 29.2*   < > 29.3* 28.0* 28.0* 29.4* 26.3*  MCV 90.7 88.8 90.6 101.4*  --  86.4  --   --  89.7  --   --   --   PLT 331 260 304 233  --  187  --   --  174  --   --   --    < > = values in this interval not displayed.    Cardiac Enzymes: No results for input(s): CKTOTAL, CKMB, CKMBINDEX, TROPONINI in the last 168 hours. BNP (last 3 results) No results for input(s): PROBNP in the last 8760 hours. CBG: Recent Labs  Lab 06/12/21 1232 06/12/21 1633 06/12/21 1938 06/12/21 2330 06/13/21 0348  GLUCAP 150* 126* 135* 116* 114*    D-Dimer: No results for input(s): DDIMER in the last 72 hours. Hgb A1c: Recent Labs    06/10/21 1518  HGBA1C 6.2*    Lipid Profile: No results for input(s): CHOL, HDL, LDLCALC, TRIG, CHOLHDL, LDLDIRECT in the last 72 hours. Thyroid function studies: No results for input(s): TSH, T4TOTAL, T3FREE, THYROIDAB in the last 72 hours.  Invalid input(s): FREET3 Anemia work up: No results for input(s): VITAMINB12, FOLATE, FERRITIN, TIBC, IRON, RETICCTPCT in the last 72 hours. Sepsis Labs: Recent Labs  Lab 06/07/21 2020 06/08/21 0426 06/09/21 0436 06/10/21 0806 06/10/21 0809 06/10/21 1518 06/11/21 0327 06/11/21 0329 06/11/21 0331  06/12/21 0330  PROCALCITON  --   --   --   --  <0.10  --   --  12.50  --   --   WBC  --    < > 10.3 12.3*  --   --   --   --  19.1* 16.9*  LATICACIDVEN 1.4  --   --   --  >11.0* 2.8* 1.2  --   --   --    < > = values in this interval not displayed.    Microbiology Recent Results (from the past 240 hour(s))  Urine Culture     Status: Abnormal   Collection Time: 06/07/21  5:35 PM   Specimen: In/Out Cath Urine  Result Value Ref Range Status   Specimen Description   Final    IN/OUT CATH URINE Performed at [facility], [address] [place], [place] [address]    Special Requests   Final    NONE Performed at [facility], [place] [address], [place], [place] [address]    Culture >=100,000 COLONIES/mL ENTEROBACTER CLOACAE (A)  Final   Report Status 06/10/2021 FINAL  Final   Organism ID, Bacteria ENTEROBACTER CLOACAE (A)  Final      Susceptibility   Enterobacter cloacae - MIC*    CEFAZOLIN >=64 RESISTANT Resistant     CEFEPIME <=0.12 SENSITIVE Sensitive     CIPROFLOXACIN <=0.25 SENSITIVE Sensitive     GENTAMICIN <=1 SENSITIVE Sensitive     IMIPENEM 0.5 SENSITIVE Sensitive     NITROFURANTOIN 32 SENSITIVE Sensitive     TRIMETH/SULFA <=20 SENSITIVE Sensitive     PIP/TAZO <=4 SENSITIVE Sensitive     * >=100,000 COLONIES/mL ENTEROBACTER CLOACAE  Blood Culture (routine x 2)     Status: None   Collection Time: 06/07/21  5:40 PM   Specimen: BLOOD RIGHT FOREARM  Result Value Ref Range Status   Specimen Description BLOOD RIGHT FOREARM  Final   Special Requests   Final    BOTTLES DRAWN AEROBIC AND ANAEROBIC Blood Culture results may not be optimal due to an excessive volume of blood received in culture bottles   Culture   Final    NO GROWTH 5 DAYS Performed at [facility], [place] [address], [place], [place] [address]    Report Status 06/12/2021 FINAL  Final  Blood Culture (routine x 2)     Status: None   Collection Time: 06/07/21  5:55 PM    Specimen: BLOOD  Result Value Ref Range Status   Specimen Description   Final    BLOOD LEFT ANTECUBITAL Performed at [place] [address], [place], [place] [address]    Special Requests   Final    BOTTLES DRAWN AEROBIC AND ANAEROBIC Blood Culture adequate volume   Culture   Final    NO GROWTH 5 DAYS Performed at [facility], [place] [address] [place], [place] [address]    Report Status 06/12/2021 FINAL  Final  Resp Panel by RT-PCR (Flu A&B, Covid) Nasopharyngeal Swab     Status: None   Collection Time: 06/07/21  6:30 PM   Specimen: Nasopharyngeal Swab; Nasopharyngeal(NP) swabs in vial transport medium  Result Value Ref Range Status   SARS Coronavirus 2 by RT PCR NEGATIVE NEGATIVE Final    Comment: (NOTE) SARS-CoV-2 target nucleic acids are NOT DETECTED.  The SARS-CoV-2 RNA is generally detectable in upper respiratory specimens during the acute phase of infection. The lowest concentration of SARS-CoV-2 viral copies this assay can detect is 138 copies/mL. A negative result does not preclude SARS-Cov-2 infection and should not be used as the sole basis for treatment or other patient management decisions. A negative result may occur with  improper specimen collection/handling, submission of specimen other than nasopharyngeal swab, presence of viral mutation(s) within the areas targeted by this assay, and inadequate number of viral copies(<138 copies/mL). A negative result must be combined with clinical observations, patient history, and epidemiological information. The expected result is Negative.  Fact Sheet for Patients:  EntrepreneurPulse.com.au  Fact Sheet for Healthcare Providers:  IncredibleEmployment.be  This test is no t yet approved or cleared by the [place] FDA and  has been authorized for detection and/or diagnosis of SARS-CoV-2 by FDA under an Emergency Use Authorization (EUA). This EUA will  remain  in effect (meaning this test can be  used) for the duration of the COVID-19 declaration under Section 564(b)(1) of the Act, 21 U.S.C.section 360bbb-3(b)(1), unless the authorization is terminated  or revoked sooner.       Influenza A by PCR NEGATIVE NEGATIVE Final   Influenza B by PCR NEGATIVE NEGATIVE Final    Comment: (NOTE) The Xpert Xpress SARS-CoV-2/FLU/RSV plus assay is intended as an aid in the diagnosis of influenza from Nasopharyngeal swab specimens and should not be used as a sole basis for treatment. Nasal washings and aspirates are unacceptable for Xpert Xpress SARS-CoV-2/FLU/RSV testing.  Fact Sheet for Patients: EntrepreneurPulse.com.au  Fact Sheet for Healthcare Providers: IncredibleEmployment.be  This test is not yet approved or cleared by the [place] FDA and has been authorized for detection and/or diagnosis of SARS-CoV-2 by FDA under an Emergency Use Authorization (EUA). This EUA will remain in effect (meaning this test can be used) for the duration of the COVID-19 declaration under Section 564(b)(1) of the Act, 21 U.S.C. section 360bbb-3(b)(1), unless the authorization is terminated or revoked.  Performed at [facility], [place] [address], [place], [place] [address]   MRSA Next Gen by PCR, Nasal     Status: Abnormal   Collection Time: 06/10/21  8:46 AM   Specimen: Nasal Mucosa; Nasal Swab  Result Value Ref Range Status   MRSA by PCR Next Gen DETECTED (A) NOT DETECTED Final    Comment: RESULT CALLED TO, READ BACK BY AND VERIFIED WITH: Leonie Man, RN 1036 06/10/21 KDS (NOTE) The GeneXpert MRSA Assay (FDA approved for NASAL specimens only), is one component of a comprehensive MRSA colonization surveillance program. It is not intended to diagnose MRSA infection nor to guide or monitor treatment for MRSA infections. Test performance is not FDA approved in patients less than 24  years old. Performed at [facility], [place] [address], [place], [place] [address]      Medications:    Chlorhexidine Gluconate Cloth  6 each Topical Daily   feeding supplement (PROSource TF)  45 mL Per Tube BID   insulin aspart  0-15 Units Subcutaneous Q4H   mouth rinse  15 mL Mouth Rinse q12n4p   mupirocin ointment  1 application Nasal BID   Continuous Infusions:  sodium chloride Stopped (06/10/21 1106)   ceFEPime (MAXIPIME) IV Stopped (06/12/21 2247)   dextrose 5% lactated ringers 50 mL/hr at 06/13/21 0500   famotidine (PEPCID) IV Stopped (06/12/21 2257)   pantoprazole 8 mg/hr (06/13/21 0330)      LOS: 6 days   Charlynne Cousins  [facility]  06/13/2021, 6:40 AM

## 2021-06-14 ENCOUNTER — Inpatient Hospital Stay (HOSPITAL_COMMUNITY): Payer: Medicare HMO

## 2021-06-14 DIAGNOSIS — K259 Gastric ulcer, unspecified as acute or chronic, without hemorrhage or perforation: Secondary | ICD-10-CM

## 2021-06-14 DIAGNOSIS — R6510 Systemic inflammatory response syndrome (SIRS) of non-infectious origin without acute organ dysfunction: Secondary | ICD-10-CM | POA: Diagnosis not present

## 2021-06-14 LAB — BPAM RBC
Blood Product Expiration Date: 202209062359
Blood Product Expiration Date: 202209062359
Blood Product Expiration Date: 202209062359
Blood Product Expiration Date: 202209062359
Blood Product Expiration Date: 202209062359
Blood Product Expiration Date: 202209062359
Blood Product Expiration Date: 202209062359
Blood Product Expiration Date: 202209072359
ISSUE DATE / TIME: 202208130951
ISSUE DATE / TIME: 202208130951
ISSUE DATE / TIME: 202208131202
ISSUE DATE / TIME: 202208131202
Unit Type and Rh: 6200
Unit Type and Rh: 6200
Unit Type and Rh: 6200
Unit Type and Rh: 6200
Unit Type and Rh: 6200
Unit Type and Rh: 6200
Unit Type and Rh: 6200
Unit Type and Rh: 6200

## 2021-06-14 LAB — GLUCOSE, CAPILLARY
Glucose-Capillary: 97 mg/dL (ref 70–99)
Glucose-Capillary: 115 mg/dL — ABNORMAL HIGH (ref 70–99)
Glucose-Capillary: 90 mg/dL (ref 70–99)
Glucose-Capillary: 141 mg/dL — ABNORMAL HIGH (ref 70–99)
Glucose-Capillary: 119 mg/dL — ABNORMAL HIGH (ref 70–99)

## 2021-06-14 LAB — TYPE AND SCREEN
ABO/RH(D): A POS
Antibody Screen: NEGATIVE
Unit division: 0
Unit division: 0
Unit division: 0
Unit division: 0
Unit division: 0
Unit division: 0
Unit division: 0
Unit division: 0

## 2021-06-14 LAB — HEMOGLOBIN AND HEMATOCRIT, BLOOD
HCT: 28.1 % — ABNORMAL LOW (ref 39.0–52.0)
HCT: 30.2 % — ABNORMAL LOW (ref 39.0–52.0)
Hemoglobin: 9.1 g/dL — ABNORMAL LOW (ref 13.0–17.0)
Hemoglobin: 9.6 g/dL — ABNORMAL LOW (ref 13.0–17.0)

## 2021-06-14 LAB — PHOSPHORUS: Phosphorus: 1.7 mg/dL — ABNORMAL LOW (ref 2.5–4.6)

## 2021-06-14 MED ORDER — PANTOPRAZOLE SODIUM 40 MG PO TBEC
40.0000 mg | DELAYED_RELEASE_TABLET | Freq: Two times a day (BID) | ORAL | Status: DC
  Administered 2021-06-14 – 2021-06-19 (×10): 40 mg via ORAL
  Filled 2021-06-14 (×10): qty 1

## 2021-06-14 MED ORDER — SIMVASTATIN 10 MG PO TABS
20.0000 mg | ORAL_TABLET | Freq: Every day | ORAL | Status: DC
  Administered 2021-06-14 – 2021-06-18 (×5): 20 mg via ORAL
  Filled 2021-06-14 (×5): qty 2

## 2021-06-14 MED ORDER — SULFAMETHOXAZOLE-TRIMETHOPRIM 800-160 MG PO TABS
1.0000 | ORAL_TABLET | Freq: Two times a day (BID) | ORAL | Status: AC
  Administered 2021-06-14 – 2021-06-16 (×5): 1 via ORAL
  Filled 2021-06-14 (×5): qty 1

## 2021-06-14 MED ORDER — TRAMADOL HCL 50 MG PO TABS
50.0000 mg | ORAL_TABLET | Freq: Four times a day (QID) | ORAL | Status: DC | PRN
Start: 2021-06-14 — End: 2021-06-16

## 2021-06-14 NOTE — TOC Progression Note (Signed)
Transition of Care (TOC) - Progression Note    Patient Details  Name: [name]. MRN: [id number] Date of Birth: April 07, 1941  Transition of Care [facility]) CM/SW [place], [place] Phone Number: 06/14/2021, 10:52 AM  Clinical Narrative:   Patient was seen in follow up to PT recommendation of SNF.  Found patient in room with eldest son at bedside.  Both are in agreement to placement. Mr Bozard is a resident of [facility], and he hopes to get into [facility].  Bed search process explained. Bed search initiated. TOC will continue to follow during the course of hospitalization.     Expected Discharge Plan: Skilled Nursing Facility Barriers to Discharge: SNF Pending bed offer  Expected Discharge Plan and Services Expected Discharge Plan: [place]   Discharge Planning Services: CM Consult Post Acute Care Choice: [place] Living arrangements for the past 2 months: Single Family Home Expected Discharge Date:  (unknown)                                     Social Determinants of Health (SDOH) Interventions    Readmission Risk Interventions No flowsheet data found.

## 2021-06-14 NOTE — Progress Notes (Signed)
Physical Therapy Treatment Patient Details Name: [name]. MRN: [id number] DOB: [birth year] Today's Date: 06/14/2021    History of Present Illness 80 y.o. male admitted 06/08/21 with progressive weakness failure to thrive at home and generalized malaise since his surgery.  Per family he has been declining with poor oral intake.  Patient was noted to be dehydrated. Dx of SIRS. Pt  with medical history significant of bladder cancer s/p cystectomy, prostatectomy and ileal conduit surgery on May 19, 2021, coronary artery disease, hyperlipidemia, essential hypertension, obstructive sleep apnea, Tourette's syndrome.    PT Comments    Patient progressing well in acute setting and advanced gait training to ~90' with min assist and RW. Patient's HR in 110-120's with majority of gait and increased to max of 150 bpm on tele at end of gait. Pt denied fatigued, DOE, SOB, dizziness, and wanted to continue mobilizing. Pt will benefit from continued skilled PT interventions to progress mobility as able. Recommending ST rehab at SNF setting to improve safety with mobility prior to return home.     Follow Up Recommendations  SNF;Supervision/Assistance - 24 hour;Supervision for mobility/OOB     Equipment Recommendations  None recommended by PT    Recommendations for Other Services       Precautions / Restrictions Precautions Precautions: Fall Precaution Comments: ileal conduit/cystectomy 05/19/21 Restrictions Weight Bearing Restrictions: No    Mobility  Bed Mobility Overal bed mobility: Needs Assistance Bed Mobility: Supine to Sit     Supine to sit: Min guard;Supervision;HOB elevated     General bed mobility comments: cues to sequence, guarding/supervision for safety.    Transfers Overall transfer level: Needs assistance Equipment used: Rolling walker (2 wheeled) Transfers: Sit to/from Stand Sit to Stand: Min assist;From elevated surface         General transfer comment: cues  for technique/hand placement. assist for power up and to steady once standing.  Ambulation/Gait Ambulation/Gait assistance: Min guard Gait Distance (Feet): 90 Feet Assistive device: Rolling walker (2 wheeled) Gait Pattern/deviations: Step-through pattern;Decreased stride length;Shuffle Gait velocity: decr   General Gait Details: cues for safe proximity to RW and assist to manage position throughout. cues for posture. HR in 110-120's for majority of gait with increased at EOS for 150 bpm. HR recovered to 80's with ~1-2 minute rest break.   Stairs             Wheelchair Mobility    Modified Rankin (Stroke Patients Only)       Balance Overall balance assessment: Needs assistance Sitting-balance support: Feet supported;No upper extremity supported Sitting balance-Leahy Scale: Fair     Standing balance support: During functional activity;Bilateral upper extremity supported Standing balance-Leahy Scale: Poor                              Cognition Arousal/Alertness: Awake/alert Behavior During Therapy: WFL for tasks assessed/performed Overall Cognitive Status: Impaired/Different from baseline Area of Impairment: Following commands;Problem solving                       Following Commands: Follows one step commands consistently;Follows one step commands with increased time;Follows multi-step commands inconsistently     Problem Solving: Requires verbal cues        Exercises      General Comments        Pertinent Vitals/Pain Pain Assessment: No/denies pain    Home Living  Prior Function            PT Goals (current goals can now be found in the care plan section) Acute Rehab PT Goals Patient Stated Goal: to be able to go home PT Goal Formulation: With patient Time For Goal Achievement: 06/23/21 Potential to Achieve Goals: Good Progress towards PT goals: Progressing toward goals    Frequency    Min  2X/week      PT Plan Current plan remains appropriate    Co-evaluation              AM-PAC PT "6 Clicks" Mobility   Outcome Measure  Help needed turning from your back to your side while in a flat bed without using bedrails?: A Little Help needed moving from lying on your back to sitting on the side of a flat bed without using bedrails?: A Lot Help needed moving to and from a bed to a chair (including a wheelchair)?: A Lot       6 Click Score: 7    End of Session Equipment Utilized During Treatment: Gait belt Activity Tolerance: Patient tolerated treatment well Patient left: with family/visitor present;with call bell/phone within reach;in chair Nurse Communication: Mobility status PT Visit Diagnosis: Difficulty in walking, not elsewhere classified (R26.2);Adult, failure to thrive (R62.7);Other abnormalities of gait and mobility (R26.89)     Time: [id number] PT Time Calculation (min) (ACUTE ONLY): 25 min  Charges:  $Gait Training: 8-22 mins $Therapeutic Activity: 8-22 mins                     Verner Mould, DPT Acute Rehabilitation Services Office [phone] Pager [phone]    [name] 06/14/2021, 5:18 PM

## 2021-06-14 NOTE — NC FL2 (Signed)
[place] LEVEL OF CARE SCREENING TOOL     IDENTIFICATION  Patient Name: [name]. Birthdate: [birth year] Sex: male Admission Date (Current Location): 06/07/2021  [facility] and [place] Number:  Herbalist and Address:  [facility],  [place], [place]      Provider Number: [id number]  Attending Physician Name and Address:  [place] Eric J, DO  Relative Name and Phone Number:  TIMOFEI, EMBERY [facility])   [phone]   Nevils,Gayle Spouse [phone]    Current Level of Care: Hospital Recommended Level of Care: [place] Prior Approval Number:    Date Approved/Denied:   PASRR Number:    Discharge Plan: SNF    Current Diagnoses: Patient Active Problem List   Diagnosis Date Noted   Acute blood loss anemia    Shock circulatory ([place])    Hemorrhagic shock (HCC)    Duodenal ulcer    Acute duodenal ulcer with bleeding    Malnutrition of moderate degree 06/09/2021   SIRS (systemic inflammatory response syndrome) ([place]) 06/07/2021   Bladder cancer ([place]) 05/19/2021   Urinary incontinence 03/28/2021   Mixed hyperlipidemia 03/21/2021   Essential hypertension 03/21/2021   Type 2 diabetes mellitus ([place]) 03/21/2021   Malignant neoplasm of overlapping sites of bladder ([place]) 01/17/2021   OSA (obstructive sleep apnea) 08/05/2013   Tourette's syndrome 04/01/2013    Orientation RESPIRATION BLADDER Height & Weight     Self, Time, Situation, Place  Normal Urostomy Weight: 87.6 kg Height:  '5\' 10"'$  (177.8 cm)  BEHAVIORAL SYMPTOMS/MOOD NEUROLOGICAL BOWEL NUTRITION STATUS   (none)  (none) Incontinent Diet (see d/c summary)  AMBULATORY STATUS COMMUNICATION OF NEEDS Skin   Extensive Assist Verbally Surgical wounds (3 drains)                       Personal Care Assistance Level of Assistance  Bathing, Feeding, Dressing Bathing Assistance: Maximum assistance Feeding assistance: Independent Dressing  Assistance: Limited assistance     Functional Limitations Info  Sight, Hearing, Speech Sight Info: Adequate Hearing Info: Adequate Speech Info: Adequate    SPECIAL CARE FACTORS FREQUENCY  PT (By licensed PT), OT (By licensed OT)     PT Frequency: 5X/W OT Frequency: 5X/W            Contractures Contractures Info: Not present    Additional Factors Info  Code Status, Allergies Code Status Info: Full Allergies Info: NKA           Current Medications (06/14/2021):  This is the current hospital active medication list Current Facility-Administered Medications  Medication Dose Route Frequency Provider Last Rate Last Admin   0.9 %  sodium chloride infusion   Intravenous Continuous Carol Ada, MD   Held at 06/13/21 1642   0.9 %  sodium chloride infusion  250 mL Intravenous Continuous Georgette Shell, MD   Stopped at 06/10/21 1106   ceFEPIme (MAXIPIME) 2 g in sodium chloride 0.9 % 100 mL IVPB  2 g Intravenous Q12H Brand Males, MD 200 mL/hr at 06/14/21 1105 2 g at 06/14/21 1105   Chlorhexidine Gluconate Cloth 2 % PADS 6 each  6 each Topical Daily Georgette Shell, MD   6 each at 06/14/21 1105   dextrose 5 % in lactated ringers infusion   Intravenous Continuous Brand Males, MD 50 mL/hr at 06/13/21 2311 New Bag at 06/13/21 2311   famotidine (PEPCID) IVPB 20 mg premix  20 mg Intravenous Q12H Brand Males,  MD 100 mL/hr at 06/13/21 2126 20 mg at 06/13/21 2126   fentaNYL (SUBLIMAZE) injection 12.5 mcg  12.5 mcg Intravenous Q2H PRN Brand Males, MD   12.5 mcg at 06/12/21 0244   insulin aspart (novoLOG) injection 0-5 Units  0-5 Units Subcutaneous QHS Charlynne Cousins, MD       insulin aspart (novoLOG) injection 0-9 Units  0-9 Units Subcutaneous TID WC Charlynne Cousins, MD   2 Units at 06/13/21 1132   insulin aspart (novoLOG) injection 2 Units  2 Units Subcutaneous TID WC Charlynne Cousins, MD   2 Units at 06/13/21 1132   insulin detemir (LEVEMIR)  injection 5 Units  5 Units Subcutaneous Daily Charlynne Cousins, MD   5 Units at 06/14/21 1106   MEDLINE mouth rinse  15 mL Mouth Rinse q12n4p Brand Males, MD   15 mL at 06/13/21 1606   mupirocin ointment (BACTROBAN) 2 % 1 application  1 application Nasal BID Brand Males, MD   1 application at 0000000 1105   ondansetron (ZOFRAN) tablet 4 mg  4 mg Oral Q6H PRN Elwyn Reach, MD       Or   ondansetron (ZOFRAN) injection 4 mg  4 mg Intravenous Q6H PRN Elwyn Reach, MD   4 mg at 06/09/21 1834   pantoprazole (PROTONIX) injection 40 mg  40 mg Intravenous Q12H Charlynne Cousins, MD   40 mg at 06/14/21 1102     Discharge Medications: Please see discharge summary for a list of discharge medications.  Relevant Imaging Results:  Relevant Lab Results:   Additional Information [place], [place]

## 2021-06-14 NOTE — Progress Notes (Signed)
PROGRESS NOTE    [name].  EZ:[id number] DOB: Jul 30, 1941 DOA: 06/07/2021 PCP: Celene Squibb, MD    Brief Narrative:  [name]. is an 80 year old male with past medical history significant for bladder cancer s/p cystectomy/prostatectomy with ileal conduit and stent placement on 05/19/2021, CAD, essential hypertension, OSA, Tourette syndrome who presented to [facility] ED on 8/10 with progressive weakness, cloudy urine with inability to ambulate and adult failure to thrive.  In the ED, temperature 98.2 F, BP 97/85, HR 114, RR 21, SPO2 93% on room air.  WBC count 15.1, hemoglobin 11.1, platelets 331.  BUN 36, otherwise BMP within normal limits.  Lactic acid 2.5.  CT head without contrast with no acute intracranial findings.  CT abdomen/pelvis with interval prostatectomy/cystectomy with right lower quadrant urostomy in place; mild right and moderate left hydronephrosis with stents in place with infrarenal abdominal aortic aneurysm 3.7 cm.  Urology was consulted for evaluation.  TRH consulted for further evaluation management of progressive weakness, adult failure to thrive, and sepsis secondary to UTI.   Assessment & Plan:   Principal Problem:   SIRS (systemic inflammatory response syndrome) (HCC) Active Problems:   OSA (obstructive sleep apnea)   Bladder cancer (HCC)   Mixed hyperlipidemia   Essential hypertension   Type 2 diabetes mellitus (HCC)   Malnutrition of moderate degree   Shock circulatory (HCC)   Hemorrhagic shock (HCC)   Duodenal ulcer   Acute duodenal ulcer with bleeding   Acute blood loss anemia   Sepsis, POA Enterococcus UTI Patient presenting to ED with progressive weakness and was found to have elevated heart rate, low blood pressure, elevated white count of 15.1.  Urine culture with Enterococcus.  Initially started on ceftriaxone which was broadened to cefepime. --Continue cefepime 2 g IV q12h  Acute respiratory failure requiring intubation due to  encephalopathy: Resolved. Patient developed significant confusion on 06/10/2021 and was subsequently intubated due to respiratory compromise and inability to protect airway.  Patient was successfully extubated on 06/11/2021.  Likely complicated by hemorrhagic shock as below.  Now oxygenating well on room air.  Hemorrhagic shock 2/2 gastric ulcer During hospitalization, patient developed confusion, altered mental status and was found to have drop in hemoglobin.  Patient was transfused total 4 units PRBCs during hospitalization.  Gastroenterology was consulted and underwent EGD showing an active bleeding ulcer which was endoscopically cauterized.  Patient did require pressors temporarily which were weaned off.  H. pylori negative. --Hgb 11.6>>>4.0>11.1>>10.3>>9.0>9.1>9.6 today stable --Protonix 40 mg p.o. twice daily --Repeat CBC in a.m.  Type 2 diabetes mellitus Hemoglobin A1c 6.2, well controlled.  Diet controlled at home. --Levemir 5u [place] daily --Novolog 2u TIDAC --SSI for coverage --CBGs qAC/HS  Essential hypertension BP 115/76 this morning, well controlled off of antihypertensives. --Continue to hold home metoprolol 25 mg p.o. daily, lisinopril 2.5 mg p.o. daily. --Continue to closely monitor BP, will start antihypertensives as needed  OSA: Continue nocturnal CPAP   CAD --Resume home simvastatin 20 mg p.o. nightly --Holding home aspirin due to gastric ulcer as above  Tourette's syndrome --Supportive care  Hx of bladder cancer Patient recently underwent cystectomy/prostatectomy with ileal conduit and stent placement on 05/19/2021, Urology; Dr. Tresa Moore.  --Wound ostomy consult for continued ostomy care --Closely monitor urine output through ostomy --Outpatient follow-up with urology, Dr. Tresa Moore  Weakness/deconditioning/debility/gait disturbance: Patient initially presented to the ED with progressive adult failure to thrive, increasing weakness, malaise following recent surgery as  above.  Patient's been evaluated by  PT/OT with recommendations of SNF for further rehabilitation.  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 --[facility] for SNF placement --Continue PT/OT efforts while inpatient   DVT prophylaxis: Place and maintain sequential compression device Start: 06/10/21 0831   Code Status: Full Code Family Communication: Updated son who is present at bedside this morning  Disposition Plan:  Level of care: Med-Surg Status is: Inpatient  Remains inpatient appropriate because:Unsafe d/c plan and Inpatient level of care appropriate due to severity of illness  Dispo: The patient is from: Home              Anticipated d/c is to: SNF              Patient currently is medically stable to d/c.   Difficult to place patient No  Consultants:  PCCM Urology GI -  Dr. Benson Norway  Procedures:  Intubation 8/13 Extubation 8/14 EGD 8/13, Dr. Henrene Pastor, giant duodenal bulb ulcer with visible vessel and bleeding s/p endoscopic hemostatic therapy  Antimicrobials:  Cefepime 8/13>> Erythromycin 8/13 - 8/13 Ceftriaxone 8/10 - 8/13   Subjective: Patient seen examined at bedside, resting comfortably.  No significant complaints this morning.  Son present at bedside.  Was made n.p.o. by GI yesterday, unclear reasons.  Discussed with GI this morning, no further plans for any EGD at this time.  Hemoglobin continues to rise and is stable.  Discussed with patient and son at bedside with recommendations of rehab placement, which they are in agreement with.  No other complaints or concerns at this time.  Denies headache, no fever/chills/night sweats, no nausea/vomiting/diarrhea, no chest pain, no palpitations, no shortness of breath, no abdominal pain.  No acute concerns overnight per nursing staff.  Objective: Vitals:   06/13/21 1200 06/13/21 2016 06/14/21 0351 06/14/21 0455  BP:  119/88 115/76   Pulse: 71 81 68   Resp:   18 18   Temp: 99.6 F (37.6 C) 98.2 F (36.8 C) 98 F (36.7 C)   TempSrc: Axillary Oral Oral   SpO2: 99% 97% 97%   Weight:    87.6 kg  Height:        Intake/Output Summary (Last 24 hours) at 06/14/2021 1252 Last data filed at 06/14/2021 0400 Gross per 24 hour  Intake --  Output 2150 ml  Net -2150 ml   Filed Weights   06/10/21 1400 06/11/21 0500 06/14/21 0455  Weight: 88.7 kg 82.9 kg 87.6 kg    Examination:  General exam: Appears calm and comfortable, weak/ill in appearance Respiratory system: Clear to auscultation. Respiratory effort normal.  On room air Cardiovascular system: S1 & S2 heard, RRR. No JVD, murmurs, rubs, gallops or clicks. No pedal edema. Gastrointestinal system: Abdomen is nondistended, soft and nontender. No organomegaly or masses felt. Normal bowel sounds heard.  Urostomy noted with clear yellow urine in urostomy bag Central nervous system: Alert and oriented. No focal neurological deficits. Extremities: Symmetric 5 x 5 power. Skin: No rashes, lesions or ulcers Psychiatry: Judgement and insight appear normal. Mood & affect appropriate.     Data Reviewed: I have personally reviewed following labs and imaging studies  CBC: Recent Labs  Lab 06/07/21 1752 06/08/21 0426 06/09/21 0436 06/10/21 0806 06/10/21 1519 06/11/21 0331 06/11/21 0950 06/12/21 0330 06/12/21 0942 06/13/21 0950 06/13/21 1609 06/13/21 2136 06/14/21 0514 06/14/21 1028  WBC 15.0* 11.1* 10.3 12.3*  --  19.1*  --  16.9*  --   --   --   --   --   --  NEUTROABS 11.9*  --   --   --   --   --   --   --   --   --   --   --   --   --   HGB 11.6* 9.7* 9.6* 4.0*   < > 9.9*   < > 9.2*   < > 9.1* 9.7* 9.0* 9.1* 9.6*  HCT 37.2* 30.0* 30.9* 14.4*   < > 29.2*   < > 28.0*   < > 27.7* 30.3* 27.8* 28.1* 30.2*  MCV 90.7 88.8 90.6 101.4*  --  86.4  --  89.7  --   --   --   --   --   --   PLT 331 260 304 233  --  187  --  174  --   --   --   --   --   --    < > = values in this interval not  displayed.   Basic Metabolic Panel: Recent Labs  Lab 06/07/21 1821 06/08/21 0426 06/09/21 0436 06/10/21 0809 06/11/21 0329 06/12/21 0330 06/13/21 0350 06/14/21 0514  NA  --  136 141 141 143 141  --   --   K  --  3.8 3.8 4.3 3.4* 3.8  --   --   CL  --  104 107 113* 113* 115*  --   --   CO2  --  25 25 12* 23 23  --   --   GLUCOSE  --  149* 136* 222* 82 131*  --   --   BUN  --  28* 22 36* 41* 32*  --   --   CREATININE  --  0.84 0.84 1.21 1.13 1.15  --   --   CALCIUM  --  8.5* 8.4* 6.9* 7.1* 7.3*  --   --   MG 1.7  --   --  1.7 2.1 1.9  --   --   PHOS  --   --   --  4.4 2.6 2.9 2.1* 1.7*   GFR: Estimated Creatinine Clearance: 55.2 mL/min (by C-G formula based on SCr of 1.15 mg/dL). Liver Function Tests: Recent Labs  Lab 06/08/21 0426 06/09/21 0436 06/10/21 0809 06/11/21 0329 06/12/21 0330  AST '27 23 27 '$ 40 23  ALT 35 34 22 42 33  ALKPHOS 78 75 43 52 51  BILITOT 0.2* 0.6 0.4 0.9 0.9  PROT 5.7* 5.7* 3.4* 4.0* 4.3*  ALBUMIN 2.1* 2.2* 1.4* 1.8* 2.0*   Recent Labs  Lab 06/10/21 0809  LIPASE 59*   Recent Labs  Lab 06/07/21 1822  AMMONIA 14   Coagulation Profile: Recent Labs  Lab 06/07/21 1752 06/10/21 1519  INR 1.1 1.5*   Cardiac Enzymes: No results for input(s): CKTOTAL, CKMB, CKMBINDEX, TROPONINI in the last 168 hours. BNP (last 3 results) No results for input(s): PROBNP in the last 8760 hours. HbA1C: No results for input(s): HGBA1C in the last 72 hours. CBG: Recent Labs  Lab 06/13/21 2138 06/13/21 2352 06/14/21 0353 06/14/21 0808 06/14/21 1213  GLUCAP 98 101* 97 115* 90   Lipid Profile: No results for input(s): CHOL, HDL, LDLCALC, TRIG, CHOLHDL, LDLDIRECT in the last 72 hours. Thyroid Function Tests: No results for input(s): TSH, T4TOTAL, FREET4, T3FREE, THYROIDAB in the last 72 hours. Anemia Panel: No results for input(s): VITAMINB12, FOLATE, FERRITIN, TIBC, IRON, RETICCTPCT in the last 72 hours. Sepsis Labs: Recent Labs  Lab 06/07/21 2020  06/10/21 0809 06/10/21 1518 06/11/21 0327  06/11/21 0329  PROCALCITON  --  <0.10  --   --  12.50  LATICACIDVEN 1.4 >11.0* 2.8* 1.2  --     Recent Results (from the past 240 hour(s))  Urine Culture     Status: Abnormal   Collection Time: 06/07/21  5:35 PM   Specimen: In/Out Cath Urine  Result Value Ref Range Status   Specimen Description   Final    IN/OUT CATH URINE Performed at [facility] [address] [place], [place] [address]    Special Requests   Final    NONE Performed at [facility], [place] [address], [place], [place] [address]    Culture >=100,000 COLONIES/mL ENTEROBACTER CLOACAE (A)  Final   Report Status 06/10/2021 FINAL  Final   Organism ID, Bacteria ENTEROBACTER CLOACAE (A)  Final      Susceptibility   Enterobacter cloacae - MIC*    CEFAZOLIN >=64 RESISTANT Resistant     CEFEPIME <=0.12 SENSITIVE Sensitive     CIPROFLOXACIN <=0.25 SENSITIVE Sensitive     GENTAMICIN <=1 SENSITIVE Sensitive     IMIPENEM 0.5 SENSITIVE Sensitive     NITROFURANTOIN 32 SENSITIVE Sensitive     TRIMETH/SULFA <=20 SENSITIVE Sensitive     PIP/TAZO <=4 SENSITIVE Sensitive     * >=100,000 COLONIES/mL ENTEROBACTER CLOACAE  Blood Culture (routine x 2)     Status: None   Collection Time: 06/07/21  5:40 PM   Specimen: BLOOD RIGHT FOREARM  Result Value Ref Range Status   Specimen Description BLOOD RIGHT FOREARM  Final   Special Requests   Final    BOTTLES DRAWN AEROBIC AND ANAEROBIC Blood Culture results may not be optimal due to an excessive volume of blood received in culture bottles   Culture   Final    NO GROWTH 5 DAYS Performed at [facility], [place] [address] [place], [place] [address]    Report Status 06/12/2021 FINAL  Final  Blood Culture (routine x 2)     Status: None   Collection Time: 06/07/21  5:55 PM   Specimen: BLOOD  Result Value Ref Range Status   Specimen Description   Final    BLOOD LEFT ANTECUBITAL Performed at [facility] [address], [place], [place] [address]    Special Requests   Final    BOTTLES DRAWN AEROBIC AND ANAEROBIC Blood Culture adequate volume   Culture   Final    NO GROWTH 5 DAYS Performed at [facility], [place] [address] [place], [place] [address]    Report Status 06/12/2021 FINAL  Final  Resp Panel by RT-PCR (Flu A&B, Covid) Nasopharyngeal Swab     Status: None   Collection Time: 06/07/21  6:30 PM   Specimen: Nasopharyngeal Swab; Nasopharyngeal(NP) swabs in vial transport medium  Result Value Ref Range Status   SARS Coronavirus 2 by RT PCR NEGATIVE NEGATIVE Final    Comment: (NOTE) SARS-CoV-2 target nucleic acids are NOT DETECTED.  The SARS-CoV-2 RNA is generally detectable in upper respiratory specimens during the acute phase of infection. The lowest concentration of SARS-CoV-2 viral copies this assay can detect is 138 copies/mL. A negative result does not preclude SARS-Cov-2 infection and should not be used as the sole basis for treatment or other patient management decisions. A negative result may occur with  improper specimen collection/handling, submission of specimen other than nasopharyngeal swab, presence of viral mutation(s) within the areas targeted by this assay, and inadequate number of viral  copies(<138 copies/mL). A negative result must be combined with clinical observations, patient history, and epidemiological information. The expected result is Negative.  Fact Sheet for Patients:  EntrepreneurPulse.com.au  Fact Sheet for Healthcare Providers:  IncredibleEmployment.be  This test is no t yet approved or cleared by the [place] FDA and  has been authorized for detection and/or diagnosis of SARS-CoV-2 by FDA under an Emergency Use Authorization (EUA). This EUA will remain  in effect (meaning this test can be used) for the duration of the COVID-19 declaration under Section 564(b)(1) of the  Act, 21 U.S.C.section 360bbb-3(b)(1), unless the authorization is terminated  or revoked sooner.       Influenza A by PCR NEGATIVE NEGATIVE Final   Influenza B by PCR NEGATIVE NEGATIVE Final    Comment: (NOTE) The Xpert Xpress SARS-CoV-2/FLU/RSV plus assay is intended as an aid in the diagnosis of influenza from Nasopharyngeal swab specimens and should not be used as a sole basis for treatment. Nasal washings and aspirates are unacceptable for Xpert Xpress SARS-CoV-2/FLU/RSV testing.  Fact Sheet for Patients: EntrepreneurPulse.com.au  Fact Sheet for Healthcare Providers: IncredibleEmployment.be  This test is not yet approved or cleared by the [place] FDA and has been authorized for detection and/or diagnosis of SARS-CoV-2 by FDA under an Emergency Use Authorization (EUA). This EUA will remain in effect (meaning this test can be used) for the duration of the COVID-19 declaration under Section 564(b)(1) of the Act, 21 U.S.C. section 360bbb-3(b)(1), unless the authorization is terminated or revoked.  Performed at [facility], [place] [address] [place], [place] [address]   MRSA Next Gen by PCR, Nasal     Status: Abnormal   Collection Time: 06/10/21  8:46 AM   Specimen: Nasal Mucosa; Nasal Swab  Result Value Ref Range Status   MRSA by PCR Next Gen DETECTED (A) NOT DETECTED Final    Comment: RESULT CALLED TO, READ BACK BY AND VERIFIED WITH: Leonie Man, RN [id number] 06/10/21 KDS (NOTE) The GeneXpert MRSA Assay (FDA approved for NASAL specimens only), is one component of a comprehensive MRSA colonization surveillance program. It is not intended to diagnose MRSA infection nor to guide or monitor treatment for MRSA infections. Test performance is not FDA approved in patients less than 97 years old. Performed at [facility], [place] [address] [place], [place] [address]          Radiology  Studies: No results found.      Scheduled Meds:  Chlorhexidine Gluconate Cloth  6 each Topical Daily   insulin aspart  0-5 Units Subcutaneous QHS   insulin aspart  0-9 Units Subcutaneous TID WC   insulin aspart  2 Units Subcutaneous TID WC   insulin detemir  5 Units Subcutaneous Daily   mouth rinse  15 mL Mouth Rinse q12n4p   mupirocin ointment  1 application Nasal BID   pantoprazole (PROTONIX) IV  40 mg Intravenous Q12H   Continuous Infusions:  sodium chloride Stopped (06/13/21 1642)   sodium chloride Stopped (06/10/21 1106)   ceFEPime (MAXIPIME) IV 2 g (06/14/21 1105)   dextrose 5% lactated ringers 50 mL/hr at 06/13/21 2311   famotidine (PEPCID) IV 20 mg (06/13/21 2126)     LOS: 7 days    Time spent: 52 minutes spent on chart review, discussion with nursing staff, consultants, updating family and interview/physical exam; more than 50% of that time was spent in counseling and/or coordination of care.    Aislyn Hayse J [place] DO [facility] Available via  Epic secure chat 7am-7pm After these hours, please refer to coverage provider listed on [email or web address] 06/14/2021, 12:52 PM

## 2021-06-15 DIAGNOSIS — R6510 Systemic inflammatory response syndrome (SIRS) of non-infectious origin without acute organ dysfunction: Secondary | ICD-10-CM | POA: Diagnosis not present

## 2021-06-15 LAB — CBC
HCT: 29.1 % — ABNORMAL LOW (ref 39.0–52.0)
Hemoglobin: 9.4 g/dL — ABNORMAL LOW (ref 13.0–17.0)
MCH: 29.1 pg (ref 26.0–34.0)
MCHC: 32.3 g/dL (ref 30.0–36.0)
MCV: 90.1 fL (ref 80.0–100.0)
Platelets: 209 10*3/uL (ref 150–400)
RBC: 3.23 MIL/uL — ABNORMAL LOW (ref 4.22–5.81)
RDW: 14.8 % (ref 11.5–15.5)
WBC: 11 10*3/uL — ABNORMAL HIGH (ref 4.0–10.5)
nRBC: 0 % (ref 0.0–0.2)

## 2021-06-15 LAB — BASIC METABOLIC PANEL
Anion gap: 6 (ref 5–15)
BUN: 12 mg/dL (ref 8–23)
CO2: 27 mmol/L (ref 22–32)
Calcium: 7.6 mg/dL — ABNORMAL LOW (ref 8.9–10.3)
Chloride: 106 mmol/L (ref 98–111)
Creatinine, Ser: 0.67 mg/dL (ref 0.61–1.24)
GFR, Estimated: >60 mL/min (ref >60)
Glucose, Bld: 90 mg/dL (ref 70–99)
Potassium: 3 mmol/L — ABNORMAL LOW (ref 3.5–5.1)
Sodium: 139 mmol/L (ref 135–145)

## 2021-06-15 LAB — GLUCOSE, CAPILLARY
Glucose-Capillary: 79 mg/dL (ref 70–99)
Glucose-Capillary: 123 mg/dL — ABNORMAL HIGH (ref 70–99)
Glucose-Capillary: 194 mg/dL — ABNORMAL HIGH (ref 70–99)
Glucose-Capillary: 69 mg/dL — ABNORMAL LOW (ref 70–99)
Glucose-Capillary: 81 mg/dL (ref 70–99)

## 2021-06-15 LAB — PHOSPHORUS: Phosphorus: 2.3 mg/dL — ABNORMAL LOW (ref 2.5–4.6)

## 2021-06-15 LAB — MAGNESIUM: Magnesium: 1.5 mg/dL — ABNORMAL LOW (ref 1.7–2.4)

## 2021-06-15 MED ORDER — POLYETHYLENE GLYCOL 3350 17 G PO PACK
17.0000 g | PACK | Freq: Once | ORAL | Status: AC
  Administered 2021-06-15: 17 g via ORAL
  Filled 2021-06-15: qty 1

## 2021-06-15 MED ORDER — K PHOS MONO-SOD PHOS DI & MONO 155-852-130 MG PO TABS
250.0000 mg | ORAL_TABLET | Freq: Once | ORAL | Status: AC
  Administered 2021-06-15: 250 mg via ORAL
  Filled 2021-06-15: qty 1

## 2021-06-15 MED ORDER — METOPROLOL SUCCINATE ER 25 MG PO TB24
25.0000 mg | ORAL_TABLET | Freq: Every day | ORAL | Status: DC
  Administered 2021-06-17 – 2021-06-19 (×2): 25 mg via ORAL
  Filled 2021-06-15 (×5): qty 1

## 2021-06-15 MED ORDER — POTASSIUM CHLORIDE CRYS ER 10 MEQ PO TBCR
40.0000 meq | EXTENDED_RELEASE_TABLET | ORAL | Status: AC
  Administered 2021-06-15 (×2): 40 meq via ORAL
  Filled 2021-06-15 (×2): qty 4

## 2021-06-15 MED ORDER — MAGNESIUM SULFATE 4 GM/100ML IV SOLN
4.0000 g | Freq: Once | INTRAVENOUS | Status: AC
  Administered 2021-06-15: 4 g via INTRAVENOUS
  Filled 2021-06-15: qty 100

## 2021-06-15 MED ORDER — SIMETHICONE 80 MG PO CHEW
80.0000 mg | CHEWABLE_TABLET | Freq: Once | ORAL | Status: AC
  Administered 2021-06-15: 80 mg via ORAL
  Filled 2021-06-15: qty 1

## 2021-06-15 MED ORDER — ACETAMINOPHEN 500 MG PO TABS
1000.0000 mg | ORAL_TABLET | Freq: Four times a day (QID) | ORAL | Status: DC | PRN
  Administered 2021-06-16 – 2021-06-19 (×2): 1000 mg via ORAL
  Filled 2021-06-15 (×2): qty 2

## 2021-06-15 MED ORDER — TRAMADOL HCL 50 MG PO TABS: 50.0000 mg | ORAL_TABLET | Freq: Four times a day (QID) | ORAL | Status: DC | PRN

## 2021-06-15 NOTE — Progress Notes (Signed)
Pt declined nocturnal cpap tonight.  Pt stated the pressure is stronger than his at home.  This Probation officer offered to adjust pressure settings, but pt still refuses to wear tonight.  Pt was advised that RT is available all night should he change his mind.  RN aware.

## 2021-06-15 NOTE — TOC Progression Note (Signed)
Transition of Care (TOC) - Progression Note    Patient Details  Name: [name]. MRN: [id number] Date of Birth: Jun 28, 1941  Transition of Care [facility]) CM/SW Kmari Halter [place], [place] Phone Number: 06/15/2021, 11:42 AM  Clinical Narrative:   Patient accepts bed offer from [facility].  Bryson Ha is working on Ship broker. Mr Ramos has been vaccinated and boosted.  Will plan to transfer to [facility] when we have confirmation of insurance auth. TOC will continue to follow during the course of hospitalization.     Expected Discharge Plan: Skilled Nursing Facility Barriers to Discharge: SNF Pending bed offer  Expected Discharge Plan and Services Expected Discharge Plan: [place]   Discharge Planning Services: CM Consult Post Acute Care Choice: [place] Living arrangements for the past 2 months: Single Family Home Expected Discharge Date:  (unknown)                                     Social Determinants of Health (SDOH) Interventions    Readmission Risk Interventions No flowsheet data found.

## 2021-06-15 NOTE — Progress Notes (Signed)
Urology Progress Note   80 y.o. male with history of bladder cancer which was muscle invasive upon TURBT with left-sided hydronephrosis upstaging it to T3 who is status post radical cystectomy and ileal conduit urinary diversion on 05/19/2021 with final pathology of pT1a.  He was discharged 6 days postoperatively after a relatively uncomplicated hospital course after return of bowel function was achieved.  He presents to the emergency room with failure to thrive at home.  Found to have a white blood cell count of 15, hypotensive.  He was afebrile and his creatinine was within normal limits.  He was just dated adequately.  Urine culture has revealed gram-negative rods.  Sensitivities and susceptibilities and speciation are pending.   Initial CT imaging on 06/07/2021 revealed mild right hydronephrosis and moderate left hydronephrosis.  Ureteral stents are adequately positioned.  He is making good urine function and his creatinine is stable.  Renal ultrasound on hospital day 1 revealed resolved right hydronephrosis and mild left hydronephrosis.  This is stable compared to his prior imaging before his surgery.  06/10/21: Acute Hb dorp to 4.0 with BRBPR. Resuscitated and intubated and started on pressors and transferred to ICU. S/p EGD with GI at bedside revealing bleeding duodenal ulcer. Fulgurated.   Interval: Normotensive and afebrile. Hb stable at 9.6. Tolerating a heart healthy diet. Appetite much improved. Being treated for Enterobacter UTI. Also on oral PPI. Marland Kitchen H Pylori negative. Urine remains clear yellow and adequate In amount.   Objective: Vital signs in last 24 hours: Temp:  [97.4 F (36.3 C)-98.3 F (36.8 C)] 97.4 F (36.3 C) (08/18 0513) Pulse Rate:  [57-75] 57 (08/18 0513) Resp:  [16-18] 18 (08/18 0513) BP: (101-128)/(61-78) 101/61 (08/18 0513) SpO2:  [99 %-100 %] 99 % (08/18 0513) Weight:  [88.7 kg] 88.7 kg (08/18 0513)  Intake/Output from previous day: 08/17 0701 - 08/18 0700 In: -   Out: 1425 [Urine:1425] Intake/Output this shift: Total I/O In: -  Out: 1425 [Urine:1425]  Physical Exam:  General: sleeping comfortable this morning CV: Regular rate Lungs: Breathing comfortably Abdomen: Abdomen is soft.  Ileostomy is pink and healthy-appearing with ureteral stents in place.  Clear yellow urine output. Ext: NT, No erythema  Lab Results: Recent Labs    06/13/21 2136 06/14/21 0514 06/14/21 1028  HGB 9.0* 9.1* 9.6*  HCT 27.8* 28.1* 30.2*    No results for input(s): NA, K, CL, CO2, GLUCOSE, BUN, CREATININE, CALCIUM in the last 72 hours.   Studies/Results: No results found.  Assessment:   80 y.o. male with history of bladder cancer which was muscle invasive upon TURBT with left-sided hydronephrosis upstaging it to T3 who is status post radical cystectomy and ileal conduit urinary diversion on 05/19/2021 with final pathology of pT1a.  He was discharged 6 days postoperatively after a relatively uncomplicated hospital course after return of bowel function was achieved.  He presents to the emergency room with failure to thrive at home.  Found to have a white blood cell count of 15, hypotensive.  He was afebrile and his creatinine was within normal limits.  He was just dated adequately.  Urine culture has Enterobacter cloacae.  Acute drop in hemoglobin with bright red blood per rectum on 06/10/2021.  Status post ICU transfer, resuscitation with blood products and IV fluids, vasopressors, intubation.  Bedside endoscopy revealed bleeding duodenal ulcer which was fulgurated by the gastroenterology team.  Hemoglobin has responded.  Patient is now weaned off of vasopressors, on the floor, tolerating diet. VSS, hemoglobin stable, tolerating  diet, and feeling much improved.   Assessment: History of bladder cancer status post cystoprostatectomy with ileal conduit Acute blood loss anemia with hemorrhagic shock secondary to upper GI bleed. Now stabilized.  Now doing much better.    Recommendations: - no urologic intervention. Agree with Uti treatment, Hb monitoring per primary team, nutrition optimization.  -Continue ureteral stents within the urostomy bag to drainage -Continue  wound ostomy nurse care  - Continue nutritional optimization - I have requested an appointment with Dr. Tresa Moore in the next two weeks. Patient will be called to schedule this.     LOS: 8 days

## 2021-06-15 NOTE — Care Management Important Message (Signed)
Important Message  Patient Details IM Letter given to the Patient. Name: [name]. MRN: [id number] Date of Birth: May 23, 1941   Medicare Important Message Given:  Yes     Kerin Salen 06/15/2021, 9:34 AM

## 2021-06-15 NOTE — Progress Notes (Signed)
PROGRESS NOTE    [name].  [id number] DOB: [birth year] DOA: 06/07/2021 PCP: Celene Squibb, MD    Brief Narrative:  [name]. is an 80 year old male with past medical history significant for bladder cancer s/p cystectomy/prostatectomy with ileal conduit and stent placement on 05/19/2021, CAD, essential hypertension, OSA, Tourette syndrome who presented to [facility] ED on 8/10 with progressive weakness, cloudy urine with inability to ambulate and adult failure to thrive.  In the ED, temperature 98.2 F, BP 97/85, HR 114, RR 21, SPO2 93% on room air.  WBC count 15.1, hemoglobin 11.1, platelets 331.  BUN 36, otherwise BMP within normal limits.  Lactic acid 2.5.  CT head without contrast with no acute intracranial findings.  CT abdomen/pelvis with interval prostatectomy/cystectomy with right lower quadrant urostomy in place; mild right and moderate left hydronephrosis with stents in place with infrarenal abdominal aortic aneurysm 3.7 cm.  Urology was consulted for evaluation.  TRH consulted for further evaluation management of progressive weakness, adult failure to thrive, and sepsis secondary to UTI.   Assessment & Plan:   Principal Problem:   SIRS (systemic inflammatory response syndrome) (HCC) Active Problems:   OSA (obstructive sleep apnea)   Bladder cancer (HCC)   Mixed hyperlipidemia   Essential hypertension   Type 2 diabetes mellitus (HCC)   Malnutrition of moderate degree   Shock circulatory (HCC)   Hemorrhagic shock (HCC)   Duodenal ulcer   Acute duodenal ulcer with bleeding   Acute blood loss anemia   Sepsis, POA Enterococcus UTI Patient presenting to ED with progressive weakness and was found to have elevated heart rate, low blood pressure, elevated white count of 15.1.  Urine culture with Enterococcus.  Initially started on ceftriaxone which was broadened to cefepime. --Continue Bactrim 800-160 mg 1 tablet every 12 hours; to complete 7-day course of  antibiotics  Acute respiratory failure requiring intubation due to encephalopathy: Resolved. Patient developed significant confusion on 06/10/2021 and was subsequently intubated due to respiratory compromise and inability to protect airway.  Patient was successfully extubated on 06/11/2021.  Likely complicated by hemorrhagic shock as below.  Now oxygenating well on room air.  Hemorrhagic shock 2/2 gastric ulcer During hospitalization, patient developed confusion, altered mental status and was found to have drop in hemoglobin.  Patient was transfused total 4 units PRBCs during hospitalization.  Gastroenterology was consulted and underwent EGD showing an active bleeding ulcer which was endoscopically cauterized.  Patient did require pressors temporarily which were weaned off.  H. pylori negative. --Hgb 11.6>>>4.0>11.1>>10.3>>9.0>9.1>9.6>9.4 today stable --Protonix 40 mg p.o. twice daily  Type 2 diabetes mellitus Hemoglobin A1c 6.2, well controlled.  Diet controlled at home. --Levemir 5u Belle daily --Novolog 2u TIDAC --SSI for coverage --CBGs qAC/HS  Essential hypertension BP 101/61 this morning, well controlled off of antihypertensives. --Continue to hold home metoprolol 25 mg p.o. daily, lisinopril 2.5 mg p.o. daily. --Continue to closely monitor BP, will start antihypertensives as needed  OSA: Continue nocturnal CPAP   CAD --Resume home simvastatin 20 mg p.o. nightly --Holding home aspirin due to gastric ulcer as above  Tourette's syndrome --Supportive care  Hx of bladder cancer Patient recently underwent cystectomy/prostatectomy with ileal conduit and stent placement on 05/19/2021, Urology; Dr. Tresa Moore.  --Wound ostomy consult for continued ostomy care --Closely monitor urine output through ostomy --Outpatient follow-up with urology, Dr. Tresa Moore in 2 weeks after discharge  Weakness/deconditioning/debility/gait disturbance: Patient initially presented to the ED with progressive adult  failure to thrive, increasing weakness, malaise  following recent surgery as above.  Patient's been evaluated by PT/OT with recommendations of SNF for further rehabilitation.  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 -- Plan discharge to [facility]; ending insurance authorization --Continue PT/OT efforts while inpatient   DVT prophylaxis: Place and maintain sequential compression device Start: 06/10/21 0831   Code Status: Full Code Family Communication: No family present at bedside this morning  Disposition Plan:  Level of care: Med-Surg Status is: Inpatient  Remains inpatient appropriate because:Unsafe d/c plan and Inpatient level of care appropriate due to severity of illness  Dispo: The patient is from: Home              Anticipated d/c is to: SNF              Patient currently is medically stable to d/c.   Difficult to place patient No  Consultants:  PCCM Urology GI -  Dr. Benson Norway  Procedures:  Intubation 8/13 Extubation 8/14 EGD 8/13, Dr. Henrene Pastor, giant duodenal bulb ulcer with visible vessel and bleeding s/p endoscopic hemostatic therapy  Antimicrobials:  Cefepime 8/13>> Erythromycin 8/13 - 8/13 Ceftriaxone 8/10 - 8/13   Subjective: Patient seen examined at bedside, resting comfortably.  Sleeping but easily arousable.  No significant complaints this morning.  Awaiting insurance authorization for SNF placement.  Seen by urology this morning with plan outpatient follow-up in 2 weeks.  Will complete antibiotics tomorrow.  No other concerns or questions at this time.  Denies headache, no fever/chills/night sweats, no nausea/vomiting/diarrhea, no chest pain, no palpitations, no shortness of breath, no abdominal pain.  No acute concerns overnight per nursing staff.  Objective: Vitals:   06/14/21 0455 06/14/21 1557 06/14/21 2024 06/15/21 0513  BP:  128/78 127/75 101/61  Pulse:  75 69 (!) 57   Resp:  '16 17 18  '$ Temp:  98.3 F (36.8 C) 98.2 F (36.8 C) (!) 97.4 F (36.3 C)  TempSrc:  Oral  Oral  SpO2:  100% 100% 99%  Weight: 87.6 kg   88.7 kg  Height:        Intake/Output Summary (Last 24 hours) at 06/15/2021 1342 Last data filed at 06/15/2021 1005 Gross per 24 hour  Intake 360 ml  Output 1825 ml  Net -1465 ml   Filed Weights   06/11/21 0500 06/14/21 0455 06/15/21 0513  Weight: 82.9 kg 87.6 kg 88.7 kg    Examination:  General exam: Appears calm and comfortable, weak/ill in appearance Respiratory system: Clear to auscultation. Respiratory effort normal.  On room air Cardiovascular system: S1 & S2 heard, RRR. No JVD, murmurs, rubs, gallops or clicks. No pedal edema. Gastrointestinal system: Abdomen is nondistended, soft and nontender. No organomegaly or masses felt. Normal bowel sounds heard.  Urostomy noted with clear yellow urine in urostomy bag Central nervous system: Alert and oriented. No focal neurological deficits. Extremities: Symmetric 5 x 5 power. Skin: No rashes, lesions or ulcers Psychiatry: Judgement and insight appear normal. Mood & affect appropriate.     Data Reviewed: I have personally reviewed following labs and imaging studies  CBC: Recent Labs  Lab 06/09/21 0436 06/10/21 0806 06/10/21 1519 06/11/21 0331 06/11/21 0950 06/12/21 0330 06/12/21 0942 06/13/21 1609 06/13/21 2136 06/14/21 0514 06/14/21 1028 06/15/21 0758  WBC 10.3 12.3*  --  19.1*  --  16.9*  --   --   --   --   --  11.0*  HGB 9.6*  4.0*   < > 9.9*   < > 9.2*   < > 9.7* 9.0* 9.1* 9.6* 9.4*  HCT 30.9* 14.4*   < > 29.2*   < > 28.0*   < > 30.3* 27.8* 28.1* 30.2* 29.1*  MCV 90.6 101.4*  --  86.4  --  89.7  --   --   --   --   --  90.1  PLT 304 233  --  187  --  174  --   --   --   --   --  209   < > = values in this interval not displayed.   Basic Metabolic Panel: Recent Labs  Lab 06/09/21 0436 06/10/21 0809 06/10/21 0809 06/11/21 0329 06/12/21 0330 06/13/21 0350  06/14/21 0514 06/15/21 0758  NA 141 141  --  143 141  --   --  139  K 3.8 4.3  --  3.4* 3.8  --   --  3.0*  CL 107 113*  --  113* 115*  --   --  106  CO2 25 12*  --  23 23  --   --  27  GLUCOSE 136* 222*  --  82 131*  --   --  90  BUN 22 36*  --  41* 32*  --   --  12  CREATININE 0.84 1.21  --  1.13 1.15  --   --  0.67  CALCIUM 8.4* 6.9*  --  7.1* 7.3*  --   --  7.6*  MG  --  1.7  --  2.1 1.9  --   --  1.5*  PHOS  --  4.4   < > 2.6 2.9 2.1* 1.7* 2.3*   < > = values in this interval not displayed.   GFR: Estimated Creatinine Clearance: 79.9 mL/min (by C-G formula based on SCr of 0.67 mg/dL). Liver Function Tests: Recent Labs  Lab 06/09/21 0436 06/10/21 0809 06/11/21 0329 06/12/21 0330  AST 23 27 40 23  ALT 34 22 42 33  ALKPHOS 75 43 52 51  BILITOT 0.6 0.4 0.9 0.9  PROT 5.7* 3.4* 4.0* 4.3*  ALBUMIN 2.2* 1.4* 1.8* 2.0*   Recent Labs  Lab 06/10/21 0809  LIPASE 59*   No results for input(s): AMMONIA in the last 168 hours.  Coagulation Profile: Recent Labs  Lab 06/10/21 1519  INR 1.5*   Cardiac Enzymes: No results for input(s): CKTOTAL, CKMB, CKMBINDEX, TROPONINI in the last 168 hours. BNP (last 3 results) No results for input(s): PROBNP in the last 8760 hours. HbA1C: No results for input(s): HGBA1C in the last 72 hours. CBG: Recent Labs  Lab 06/14/21 1213 06/14/21 1705 06/14/21 2025 06/15/21 0757 06/15/21 1243  GLUCAP 90 141* 119* 79 123*   Lipid Profile: No results for input(s): CHOL, HDL, LDLCALC, TRIG, CHOLHDL, LDLDIRECT in the last 72 hours. Thyroid Function Tests: No results for input(s): TSH, T4TOTAL, FREET4, T3FREE, THYROIDAB in the last 72 hours. Anemia Panel: No results for input(s): VITAMINB12, FOLATE, FERRITIN, TIBC, IRON, RETICCTPCT in the last 72 hours. Sepsis Labs: Recent Labs  Lab 06/10/21 0809 06/10/21 1518 06/11/21 0327 06/11/21 0329  PROCALCITON <0.10  --   --  12.50  LATICACIDVEN >11.0* 2.8* 1.2  --     Recent Results (from the  past 240 hour(s))  Urine Culture     Status: Abnormal   Collection Time: 06/07/21  5:35 PM   Specimen: In/Out Cath Urine  Result Value Ref Range Status  Specimen Description   Final    IN/OUT CATH URINE Performed at [facility] [address], [place], [place] [address]    Special Requests   Final    NONE Performed at [facility], [place] [address], [place], [place] [address]    Culture >=100,000 COLONIES/mL ENTEROBACTER CLOACAE (A)  Final   Report Status 06/10/2021 FINAL  Final   Organism ID, Bacteria ENTEROBACTER CLOACAE (A)  Final      Susceptibility   Enterobacter cloacae - MIC*    CEFAZOLIN >=64 RESISTANT Resistant     CEFEPIME <=0.12 SENSITIVE Sensitive     CIPROFLOXACIN <=0.25 SENSITIVE Sensitive     GENTAMICIN <=1 SENSITIVE Sensitive     IMIPENEM 0.5 SENSITIVE Sensitive     NITROFURANTOIN 32 SENSITIVE Sensitive     TRIMETH/SULFA <=20 SENSITIVE Sensitive     PIP/TAZO <=4 SENSITIVE Sensitive     * >=100,000 COLONIES/mL ENTEROBACTER CLOACAE  Blood Culture (routine x 2)     Status: None   Collection Time: 06/07/21  5:40 PM   Specimen: BLOOD RIGHT FOREARM  Result Value Ref Range Status   Specimen Description BLOOD RIGHT FOREARM  Final   Special Requests   Final    BOTTLES DRAWN AEROBIC AND ANAEROBIC Blood Culture results may not be optimal due to an excessive volume of blood received in culture bottles   Culture   Final    NO GROWTH 5 DAYS Performed at [facility], [place] [address], [place], [place] [address]    Report Status 06/12/2021 FINAL  Final  Blood Culture (routine x 2)     Status: None   Collection Time: 06/07/21  5:55 PM   Specimen: BLOOD  Result Value Ref Range Status   Specimen Description   Final    BLOOD LEFT ANTECUBITAL Performed at [facility] [address] [place], [place] [address]    Special Requests   Final    BOTTLES DRAWN AEROBIC AND ANAEROBIC Blood Culture adequate volume    Culture   Final    NO GROWTH 5 DAYS Performed at [facility], [place] [address] [place],  [address]    Report Status 06/12/2021 FINAL  Final  Resp Panel by RT-PCR (Flu A&B, Covid) Nasopharyngeal Swab     Status: None   Collection Time: 06/07/21  6:30 PM   Specimen: Nasopharyngeal Swab; Nasopharyngeal(NP) swabs in vial transport medium  Result Value Ref Range Status   SARS Coronavirus 2 by RT PCR NEGATIVE NEGATIVE Final    Comment: (NOTE) SARS-CoV-2 target nucleic acids are NOT DETECTED.  The SARS-CoV-2 RNA is generally detectable in upper respiratory specimens during the acute phase of infection. The lowest concentration of SARS-CoV-2 viral copies this assay can detect is 138 copies/mL. A negative result does not preclude SARS-Cov-2 infection and should not be used as the sole basis for treatment or other patient management decisions. A negative result may occur with  improper specimen collection/handling, submission of specimen other than nasopharyngeal swab, presence of viral mutation(s) within the areas targeted by this assay, and inadequate number of viral copies(<138 copies/mL). A negative result must be combined with clinical observations, patient history, and epidemiological information. The expected result is Negative.  Fact Sheet for Patients:  EntrepreneurPulse.com.au  Fact Sheet for Healthcare Providers:  IncredibleEmployment.be  This test is no t yet approved or cleared by the [place] FDA and  has been authorized for detection and/or diagnosis of SARS-CoV-2 by FDA under  an Emergency Use Authorization (EUA). This EUA will remain  in effect (meaning this test can be used) for the duration of the COVID-19 declaration under Section 564(b)(1) of the Act, 21 U.S.C.section 360bbb-3(b)(1), unless the authorization is terminated  or revoked sooner.       Influenza A by PCR NEGATIVE NEGATIVE Final   Influenza B by PCR  NEGATIVE NEGATIVE Final    Comment: (NOTE) The Xpert Xpress SARS-CoV-2/FLU/RSV plus assay is intended as an aid in the diagnosis of influenza from Nasopharyngeal swab specimens and should not be used as a sole basis for treatment. Nasal washings and aspirates are unacceptable for Xpert Xpress SARS-CoV-2/FLU/RSV testing.  Fact Sheet for Patients: EntrepreneurPulse.com.au  Fact Sheet for Healthcare Providers: IncredibleEmployment.be  This test is not yet approved or cleared by the [place] FDA and has been authorized for detection and/or diagnosis of SARS-CoV-2 by FDA under an Emergency Use Authorization (EUA). This EUA will remain in effect (meaning this test can be used) for the duration of the COVID-19 declaration under Section 564(b)(1) of the Act, 21 U.S.C. section 360bbb-3(b)(1), unless the authorization is terminated or revoked.  Performed at [facility], [place] [address], [place], [place] [address]   MRSA Next Gen by PCR, Nasal     Status: Abnormal   Collection Time: 06/10/21  8:46 AM   Specimen: Nasal Mucosa; Nasal Swab  Result Value Ref Range Status   MRSA by PCR Next Gen DETECTED (A) NOT DETECTED Final    Comment: RESULT CALLED TO, READ BACK BY AND VERIFIED WITH: Leonie Man, RN [id number] 06/10/21 KDS (NOTE) The GeneXpert MRSA Assay (FDA approved for NASAL specimens only), is one component of a comprehensive MRSA colonization surveillance program. It is not intended to diagnose MRSA infection nor to guide or monitor treatment for MRSA infections. Test performance is not FDA approved in patients less than 82 years old. Performed at [facility], [place] [address], [place], [place] [address]          Radiology Studies: No results found.      Scheduled Meds:  Chlorhexidine Gluconate Cloth  6 each Topical Daily   insulin aspart  0-5 Units Subcutaneous QHS   insulin aspart  0-9 Units  Subcutaneous TID WC   insulin aspart  2 Units Subcutaneous TID WC   insulin detemir  5 Units Subcutaneous Daily   mouth rinse  15 mL Mouth Rinse q12n4p   metoprolol succinate  25 mg Oral Daily   pantoprazole  40 mg Oral BID   simvastatin  20 mg Oral QHS   sulfamethoxazole-trimethoprim  1 tablet Oral Q12H   Continuous Infusions:  sodium chloride Stopped (06/10/21 1106)     LOS: 8 days    Time spent: 42 minutes spent on chart review, discussion with nursing staff, consultants, updating family and interview/physical exam; more than 50% of that time was spent in counseling and/or coordination of care.    Micca Matura J [place] DO [facility] Available via Epic secure chat 7am-7pm After these hours, please refer to coverage provider listed on [email or web address] 06/15/2021, 1:42 PM

## 2021-06-16 DIAGNOSIS — Z8551 Personal history of malignant neoplasm of bladder: Secondary | ICD-10-CM

## 2021-06-16 DIAGNOSIS — G4733 Obstructive sleep apnea (adult) (pediatric): Secondary | ICD-10-CM

## 2021-06-16 DIAGNOSIS — R531 Weakness: Secondary | ICD-10-CM

## 2021-06-16 DIAGNOSIS — E119 Type 2 diabetes mellitus without complications: Secondary | ICD-10-CM

## 2021-06-16 DIAGNOSIS — R2689 Other abnormalities of gait and mobility: Secondary | ICD-10-CM

## 2021-06-16 DIAGNOSIS — F952 Tourette's disorder: Secondary | ICD-10-CM

## 2021-06-16 DIAGNOSIS — R5381 Other malaise: Secondary | ICD-10-CM

## 2021-06-16 DIAGNOSIS — B952 Enterococcus as the cause of diseases classified elsewhere: Secondary | ICD-10-CM

## 2021-06-16 DIAGNOSIS — I2510 Atherosclerotic heart disease of native coronary artery without angina pectoris: Secondary | ICD-10-CM

## 2021-06-16 LAB — GLUCOSE, CAPILLARY
Glucose-Capillary: 79 mg/dL (ref 70–99)
Glucose-Capillary: 134 mg/dL — ABNORMAL HIGH (ref 70–99)
Glucose-Capillary: 75 mg/dL (ref 70–99)
Glucose-Capillary: 100 mg/dL — ABNORMAL HIGH (ref 70–99)

## 2021-06-16 MED ORDER — TRAZODONE HCL 50 MG PO TABS
50.0000 mg | ORAL_TABLET | Freq: Once | ORAL | Status: AC
  Administered 2021-06-16: 50 mg via ORAL
  Filled 2021-06-16: qty 1

## 2021-06-16 MED ORDER — POLYETHYLENE GLYCOL 3350 17 G PO PACK
17.0000 g | PACK | Freq: Every day | ORAL | Status: DC | PRN
  Filled 2021-06-16: qty 1

## 2021-06-16 MED ORDER — SIMETHICONE 80 MG PO CHEW
80.0000 mg | CHEWABLE_TABLET | Freq: Four times a day (QID) | ORAL | Status: DC | PRN
  Filled 2021-06-16: qty 1

## 2021-06-16 NOTE — Progress Notes (Signed)
PROGRESS NOTE    [name].  [id number] DOB: 01-26-1941 DOA: 06/07/2021 PCP: Celene Squibb, MD    Brief Narrative:  Alex Mccaleb. is an 80 year old male with past medical history significant for bladder cancer s/p cystectomy/prostatectomy with ileal conduit and stent placement on 05/19/2021, CAD, essential hypertension, OSA, Tourette syndrome who presented to [facility] ED on 8/10 with progressive weakness, cloudy urine with inability to ambulate and adult failure to thrive.  In the ED, temperature 98.2 F, BP 97/85, HR 114, RR 21, SPO2 93% on room air.  WBC count 15.1, hemoglobin 11.1, platelets 331.  BUN 36, otherwise BMP within normal limits.  Lactic acid 2.5.  CT head without contrast with no acute intracranial findings.  CT abdomen/pelvis with interval prostatectomy/cystectomy with right lower quadrant urostomy in place; mild right and moderate left hydronephrosis with stents in place with infrarenal abdominal aortic aneurysm 3.7 cm.  Urology was consulted for evaluation.  TRH consulted for further evaluation management of progressive weakness, adult failure to thrive, and sepsis secondary to UTI.   Assessment & Plan:   Principal Problem:   SIRS (systemic inflammatory response syndrome) (HCC) Active Problems:   OSA (obstructive sleep apnea)   Bladder cancer (HCC)   Mixed hyperlipidemia   Essential hypertension   Type 2 diabetes mellitus (HCC)   Malnutrition of moderate degree   Shock circulatory (HCC)   Hemorrhagic shock (HCC)   Duodenal ulcer   Acute duodenal ulcer with bleeding   Acute blood loss anemia   Sepsis, POA Enterococcus UTI Patient presenting to ED with progressive weakness and was found to have elevated heart rate, low blood pressure, elevated white count of 15.1.  Urine culture with Enterococcus.  Initially started on ceftriaxone which was broadened to cefepime. --Continue Bactrim 800-160 mg 1 tablet every 12 hours; to complete 7-day course of  antibiotics  Acute respiratory failure requiring intubation due to encephalopathy: Resolved. Patient developed significant confusion on 06/10/2021 and was subsequently intubated due to respiratory compromise and inability to protect airway.  Patient was successfully extubated on 06/11/2021.  Likely complicated by hemorrhagic shock as below.  Now oxygenating well on room air.  Hemorrhagic shock 2/2 gastric ulcer During hospitalization, patient developed confusion, altered mental status and was found to have drop in hemoglobin.  Patient was transfused total 4 units PRBCs during hospitalization.  Gastroenterology was consulted and underwent EGD showing an active bleeding ulcer which was endoscopically cauterized.  Patient did require pressors temporarily which were weaned off.  H. pylori negative. --Hgb 11.6>>>4.0>11.1>>10.3>>9.0>9.1>9.6>9.4 stable --Protonix 40 mg p.o. twice daily  Type 2 diabetes mellitus Hemoglobin A1c 6.2, well controlled.  Diet controlled at home. --Levemir 5u Universal daily --Novolog 2u TIDAC --SSI for coverage --CBGs qAC/HS  Essential hypertension BP 102/63 this morning, well controlled off of antihypertensives. --Continue to hold home metoprolol 25 mg p.o. daily, lisinopril 2.5 mg p.o. daily. --Continue to closely monitor BP, will start antihypertensives as needed  OSA: Continue nocturnal CPAP   CAD --Resume home simvastatin 20 mg p.o. nightly --Holding home aspirin due to gastric ulcer as above  Tourette's syndrome --Supportive care  Hx of bladder cancer Patient recently underwent cystectomy/prostatectomy with ileal conduit and stent placement on 05/19/2021, Urology; Dr. Tresa Moore.  --Wound ostomy consulted for continued ostomy care --Closely monitor urine output through ostomy --Outpatient follow-up with urology, Dr. Tresa Moore in 2 weeks after discharge  Weakness/deconditioning/debility/gait disturbance: Patient initially presented to the ED with progressive adult failure  to thrive, increasing weakness, malaise following  recent surgery as above.  Patient's been evaluated by PT/OT with recommendations of SNF for further rehabilitation.  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 --Plan discharge to [facility]; ending insurance authorization --Continue PT/OT efforts while inpatient   DVT prophylaxis: Place and maintain sequential compression device Start: 06/10/21 0831   Code Status: Full Code Family Communication: Family friend present at bedside this morning, updated on plan of care  Disposition Plan:  Level of care: Med-Surg Status is: Inpatient  Remains inpatient appropriate because:Unsafe d/c plan and Inpatient level of care appropriate due to severity of illness  Dispo: The patient is from: Home              Anticipated d/c is to: SNF              Patient currently is medically stable to d/c.   Difficult to place patient No  Consultants:  PCCM Urology GI -  Dr. Benson Norway  Procedures:  Intubation 8/13 Extubation 8/14 EGD 8/13, Dr. Henrene Pastor, giant duodenal bulb ulcer with visible vessel and bleeding s/p endoscopic hemostatic therapy  Antimicrobials:  Cefepime 8/13>> Erythromycin 8/13 - 8/13 Ceftriaxone 8/10 - 8/13   Subjective: Patient seen examined at bedside, resting comfortably.  Patient's friend is present.  No complaints this morning other than not fond of hospital food.  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  Excited to transition his care to rehab so he can become stronger and return home with better independence.  Continue to await insurance authorization for SNF placement.  No other concerns or questions at this time.  Denies headache, no fever/chills/night sweats, no nausea/vomiting/diarrhea, no chest pain, no palpitations, no shortness of breath, no abdominal pain.  No acute  concerns overnight per nursing staff.  Objective: Vitals:   06/15/21 1530 06/15/21 2009 06/16/21 0504 06/16/21 0951  BP: 91/71 114/69 121/63 108/63  Pulse: 84 68 81   Resp: '18 18 20   '$ Temp: 98.3 F (36.8 C) 98.5 F (36.9 C) 98.4 F (36.9 C)   TempSrc: Oral Oral Oral   SpO2: 97% 100% 98%   Weight:   82.2 kg   Height:        Intake/Output Summary (Last 24 hours) at 06/16/2021 1308 Last data filed at 06/16/2021 0900 Gross per 24 hour  Intake 141.43 ml  Output 800 ml  Net -658.57 ml   Filed Weights   06/14/21 0455 06/15/21 0513 06/16/21 0504  Weight: 87.6 kg 88.7 kg 82.2 kg    Examination:  General exam: Appears calm and comfortable, weak/ill in appearance Respiratory system: Clear to auscultation. Respiratory effort normal.  On room air Cardiovascular system: S1 & S2 heard, RRR. No JVD, murmurs, rubs, gallops or clicks. No pedal edema. Gastrointestinal system: Abdomen is nondistended, soft and nontender. No organomegaly or masses felt. Normal bowel sounds heard.  Urostomy noted with clear yellow urine in urostomy bag with stents noted in bag Central nervous system: Alert and oriented. No focal neurological deficits. Extremities: Symmetric 5 x 5 power. Skin: No rashes, lesions or ulcers Psychiatry: Judgement and insight appear normal. Mood & affect appropriate.     Data Reviewed: I have personally reviewed following labs and imaging studies  CBC: Recent Labs  Lab 06/10/21 0806 06/10/21 1519 06/11/21 0331 06/11/21 0950 06/12/21 0330 06/12/21 0942 06/13/21 1609 06/13/21 2136 06/14/21 0514 06/14/21 1028 06/15/21  0758  WBC 12.3*  --  19.1*  --  16.9*  --   --   --   --   --  11.0*  HGB 4.0*   < > 9.9*   < > 9.2*   < > 9.7* 9.0* 9.1* 9.6* 9.4*  HCT 14.4*   < > 29.2*   < > 28.0*   < > 30.3* 27.8* 28.1* 30.2* 29.1*  MCV 101.4*  --  86.4  --  89.7  --   --   --   --   --  90.1  PLT 233  --  187  --  174  --   --   --   --   --  209   < > = values in this interval not  displayed.   Basic Metabolic Panel: Recent Labs  Lab 06/10/21 0809 06/11/21 0329 06/12/21 0330 06/13/21 0350 06/14/21 0514 06/15/21 0758  NA 141 143 141  --   --  139  K 4.3 3.4* 3.8  --   --  3.0*  CL 113* 113* 115*  --   --  106  CO2 12* 23 23  --   --  27  GLUCOSE 222* 82 131*  --   --  90  BUN 36* 41* 32*  --   --  12  CREATININE 1.21 1.13 1.15  --   --  0.67  CALCIUM 6.9* 7.1* 7.3*  --   --  7.6*  MG 1.7 2.1 1.9  --   --  1.5*  PHOS 4.4 2.6 2.9 2.1* 1.7* 2.3*   GFR: Estimated Creatinine Clearance: 73.5 mL/min (by C-G formula based on SCr of 0.67 mg/dL). Liver Function Tests: Recent Labs  Lab 06/10/21 0809 06/11/21 0329 06/12/21 0330  AST 27 40 23  ALT 22 42 33  ALKPHOS 43 52 51  BILITOT 0.4 0.9 0.9  PROT 3.4* 4.0* 4.3*  ALBUMIN 1.4* 1.8* 2.0*   Recent Labs  Lab 06/10/21 0809  LIPASE 59*   No results for input(s): AMMONIA in the last 168 hours.  Coagulation Profile: Recent Labs  Lab 06/10/21 1519  INR 1.5*   Cardiac Enzymes: No results for input(s): CKTOTAL, CKMB, CKMBINDEX, TROPONINI in the last 168 hours. BNP (last 3 results) No results for input(s): PROBNP in the last 8760 hours. HbA1C: No results for input(s): HGBA1C in the last 72 hours. CBG: Recent Labs  Lab 06/15/21 1653 06/15/21 2121 06/15/21 2149 06/16/21 0740 06/16/21 1137  GLUCAP 194* 69* 81 79 134*   Lipid Profile: No results for input(s): CHOL, HDL, LDLCALC, TRIG, CHOLHDL, LDLDIRECT in the last 72 hours. Thyroid Function Tests: No results for input(s): TSH, T4TOTAL, FREET4, T3FREE, THYROIDAB in the last 72 hours. Anemia Panel: No results for input(s): VITAMINB12, FOLATE, FERRITIN, TIBC, IRON, RETICCTPCT in the last 72 hours. Sepsis Labs: Recent Labs  Lab 06/10/21 0809 06/10/21 1518 06/11/21 0327 06/11/21 0329  PROCALCITON <0.10  --   --  12.50  LATICACIDVEN >11.0* 2.8* 1.2  --     Recent Results (from the past 240 hour(s))  Urine Culture     Status: Abnormal    Collection Time: 06/07/21  5:35 PM   Specimen: In/Out Cath Urine  Result Value Ref Range Status   Specimen Description   Final    IN/OUT CATH URINE Performed at [facility], [place] [address] [place], [place] [address]    Special Requests   Final    NONE Performed at [facility]  [place] [address] [place], [place] [address]    Culture >=100,000 COLONIES/mL ENTEROBACTER CLOACAE (A)  Final   Report Status 06/10/2021 FINAL  Final   Organism ID, Bacteria ENTEROBACTER CLOACAE (A)  Final      Susceptibility   Enterobacter cloacae - MIC*    CEFAZOLIN >=64 RESISTANT Resistant     CEFEPIME <=0.12 SENSITIVE Sensitive     CIPROFLOXACIN <=0.25 SENSITIVE Sensitive     GENTAMICIN <=1 SENSITIVE Sensitive     IMIPENEM 0.5 SENSITIVE Sensitive     NITROFURANTOIN 32 SENSITIVE Sensitive     TRIMETH/SULFA <=20 SENSITIVE Sensitive     PIP/TAZO <=4 SENSITIVE Sensitive     * >=100,000 COLONIES/mL ENTEROBACTER CLOACAE  Blood Culture (routine x 2)     Status: None   Collection Time: 06/07/21  5:40 PM   Specimen: BLOOD RIGHT FOREARM  Result Value Ref Range Status   Specimen Description BLOOD RIGHT FOREARM  Final   Special Requests   Final    BOTTLES DRAWN AEROBIC AND ANAEROBIC Blood Culture results may not be optimal due to an excessive volume of blood received in culture bottles   Culture   Final    NO GROWTH 5 DAYS Performed at [facility], [place] [address] [place], [place] [address]    Report Status 06/12/2021 FINAL  Final  Blood Culture (routine x 2)     Status: None   Collection Time: 06/07/21  5:55 PM   Specimen: BLOOD  Result Value Ref Range Status   Specimen Description   Final    BLOOD LEFT ANTECUBITAL Performed at [place] [address], [place], [place] [address]    Special Requests   Final    BOTTLES DRAWN AEROBIC AND ANAEROBIC Blood Culture adequate volume   Culture   Final    NO GROWTH 5 DAYS Performed at [facility], [place] [address], [place], [place] [address]    Report Status 06/12/2021 FINAL  Final  Resp Panel by RT-PCR (Flu A&B, Covid) Nasopharyngeal Swab     Status: None   Collection Time: 06/07/21  6:30 PM   Specimen: Nasopharyngeal Swab; Nasopharyngeal(NP) swabs in vial transport medium  Result Value Ref Range Status   SARS Coronavirus 2 by RT PCR NEGATIVE NEGATIVE Final    Comment: (NOTE) SARS-CoV-2 target nucleic acids are NOT DETECTED.  The SARS-CoV-2 RNA is generally detectable in upper respiratory specimens during the acute phase of infection. The lowest concentration of SARS-CoV-2 viral copies this assay can detect is 138 copies/mL. A negative result does not preclude SARS-Cov-2 infection and should not be used as the sole basis for treatment or other patient management decisions. A negative result may occur with  improper specimen collection/handling, submission of specimen other than nasopharyngeal swab, presence of viral mutation(s) within the areas targeted by this assay, and inadequate number of viral copies(<138 copies/mL). A negative result must be combined with clinical observations, patient history, and epidemiological information. The expected result is Negative.  Fact Sheet for Patients:  EntrepreneurPulse.com.au  Fact Sheet for Healthcare Providers:  IncredibleEmployment.be  This test is no t yet approved or cleared by the [place] FDA and  has been authorized for detection and/or diagnosis of SARS-CoV-2 by FDA under an Emergency Use Authorization (EUA). This EUA will remain  in effect (meaning this test can be used) for the duration of the COVID-19 declaration under Section 564(b)(1) of the Act, 21 U.S.C.section 360bbb-3(b)(1), unless the authorization is terminated  or revoked sooner.       Influenza A by PCR NEGATIVE NEGATIVE Final   Influenza B by PCR NEGATIVE NEGATIVE Final    Comment: (NOTE) The Xpert  Xpress SARS-CoV-2/FLU/RSV plus assay is intended as an aid in the diagnosis of influenza from Nasopharyngeal swab specimens and should not be used as a sole basis for treatment. Nasal washings and aspirates are unacceptable for Xpert Xpress SARS-CoV-2/FLU/RSV testing.  Fact Sheet for Patients: EntrepreneurPulse.com.au  Fact Sheet for Healthcare Providers: IncredibleEmployment.be  This test is not yet approved or cleared by the [place] FDA and has been authorized for detection and/or diagnosis of SARS-CoV-2 by FDA under an Emergency Use Authorization (EUA). This EUA will remain in effect (meaning this test can be used) for the duration of the COVID-19 declaration under Section 564(b)(1) of the Act, 21 U.S.C. section 360bbb-3(b)(1), unless the authorization is terminated or revoked.  Performed at [facility], [place] [address], [place], [place] [address]   MRSA Next Gen by PCR, Nasal     Status: Abnormal   Collection Time: 06/10/21  8:46 AM   Specimen: Nasal Mucosa; Nasal Swab  Result Value Ref Range Status   MRSA by PCR Next Gen DETECTED (A) NOT DETECTED Final    Comment: RESULT CALLED TO, READ BACK BY AND VERIFIED WITH: Leonie Man, RN [phone] 06/10/21 KDS (NOTE) The GeneXpert MRSA Assay (FDA approved for NASAL specimens only), is one component of a comprehensive MRSA colonization surveillance program. It is not intended to diagnose MRSA infection nor to guide or monitor treatment for MRSA infections. Test performance is not FDA approved in patients less than 37 years old. Performed at [facility], [place] [address], [place], [place] [address]          Radiology Studies: No results found.      Scheduled Meds:  Chlorhexidine Gluconate Cloth  6 each Topical Daily   insulin aspart  0-5 Units Subcutaneous QHS   insulin aspart  0-9 Units Subcutaneous TID WC   insulin aspart  2 Units  Subcutaneous TID WC   insulin detemir  5 Units Subcutaneous Daily   mouth rinse  15 mL Mouth Rinse q12n4p   metoprolol succinate  25 mg Oral Daily   pantoprazole  40 mg Oral BID   simvastatin  20 mg Oral QHS   sulfamethoxazole-trimethoprim  1 tablet Oral Q12H   Continuous Infusions:  sodium chloride Stopped (06/10/21 1106)     LOS: 9 days    Time spent: 39 minutes spent on chart review, discussion with nursing staff, consultants, updating family and interview/physical exam; more than 50% of that time was spent in counseling and/or coordination of care.    Rylann Munford J [place] DO [facility] Available via Epic secure chat 7am-7pm After these hours, please refer to coverage provider listed on [email or web address] 06/16/2021, 1:08 PM

## 2021-06-16 NOTE — Progress Notes (Addendum)
Patient complained of chest tightness, states that the tightness just started but feels better when he passes gas. Patient denies pain, states that tightness is not radiating to neck, jaw, arm, or shoulder. EKG and vitals obtained. Gershon Cull, NP notified. Simethicone prescribed and given. Will continue to monitor patient.

## 2021-06-16 NOTE — Progress Notes (Signed)
Occupational Therapy Treatment Patient Details Name: [name]. MRN: [id number] DOB: 02-Sep-1941 Today's Date: 06/16/2021    History of present illness 80 y.o. male admitted 06/08/21 with progressive weakness failure to thrive at home and generalized malaise since his surgery.  Per family he has been declining with poor oral intake.  Patient was noted to be dehydrated. Dx of SIRS. Pt  with medical history significant of bladder cancer s/p cystectomy, prostatectomy and ileal conduit surgery on May 19, 2021, coronary artery disease, hyperlipidemia, essential hypertension, obstructive sleep apnea, Tourette's syndrome.   OT comments  Patient made progress towards goals. Patient was noted to have had PT session shortly before OT session on this date per son report. Patient agreed to participate in simulated toileting tasks prior to getting back to bed to rest prior to eating lunch. Patient's discharge plan remains appropriate at this time. OT will continue to follow acutely.    Follow Up Recommendations  SNF    Equipment Recommendations  Tub/shower seat    Recommendations for Other Services      Precautions / Restrictions Precautions Precautions: Fall Precaution Comments: ileal conduit/cystectomy 05/19/21 Restrictions Weight Bearing Restrictions: No       Mobility Bed Mobility Overal bed mobility: Needs Assistance Bed Mobility: Sit to Supine Rolling: Supervision   Supine to sit: Min guard;Supervision;HOB elevated Sit to supine: Min guard   General bed mobility comments: extra time, guarding for safety with scoot to EOB    Transfers Overall transfer level: Needs assistance Equipment used: Rolling walker (2 wheeled) Transfers: Sit to/from Stand Sit to Stand: Min guard Stand pivot transfers: Min guard       General transfer comment: cues for technique/hand placement. assist for power up and to steady once standing.    Balance Overall balance assessment: Needs  assistance Sitting-balance support: Feet supported;No upper extremity supported Sitting balance-Leahy Scale: Fair     Standing balance support: During functional activity;Bilateral upper extremity supported Standing balance-Leahy Scale: Poor Standing balance comment: relies on BUE support                           ADL either performed or assessed with clinical judgement   ADL Overall ADL's : Needs assistance/impaired                         Toilet Transfer: Min Marine scientist Details (indicate cue type and reason): needs verbal cues not to pull on walker to stand., min G for safety           General ADL Comments: patient was fatigued post PT session in room. patient participated in standing balance challanges with RW to increase independence in ADLs. patient was min guard for functional mobility from recliner in room to commode and back to bed. patient was able to scoot up in bed with cues when to complete tasks with min guard. patient was educated on importance of eating for each meal. patient verbalized understanding. patients son was present for session.     Vision Patient Visual Report: No change from baseline     Perception     Praxis      Cognition Arousal/Alertness: Awake/alert Behavior During Therapy: WFL for tasks assessed/performed Overall Cognitive Status: Impaired/Different from baseline Area of Impairment: Following commands;Problem solving                       Following Commands: Follows one  step commands consistently;Follows one step commands with increased time;Follows multi-step commands inconsistently     Problem Solving: Requires verbal cues General Comments: patient is easily fixated on one specific thing during session with consistent cues to redirect and attend to task. patient is still cooperative with tasks. patient was fixated on insurance decision for SNF placement at this time.         Exercises Exercises: Other exercises Other Exercises Other Exercises: 5 reps Sit<>Stand with bil UE use for power up and controled lowering.   Shoulder Instructions       General Comments      Pertinent Vitals/ Pain       Pain Assessment: No/denies pain  Home Living                                          Prior Functioning/Environment              Frequency  Min 2X/week        Progress Toward Goals  OT Goals(current goals can now be found in the care plan section)  Progress towards OT goals: Progressing toward goals  Acute Rehab OT Goals Patient Stated Goal: to be able to go home  Plan Discharge plan remains appropriate    Co-evaluation                 AM-PAC OT "6 Clicks" Daily Activity     Outcome Measure   Help from another person eating meals?: None Help from another person taking care of personal grooming?: A Little Help from another person toileting, which includes using toliet, bedpan, or urinal?: A Lot Help from another person bathing (including washing, rinsing, drying)?: A Lot Help from another person to put on and taking off regular upper body clothing?: A Little Help from another person to put on and taking off regular lower body clothing?: A Lot 6 Click Score: 16    End of Session Equipment Utilized During Treatment: Rolling walker  OT Visit Diagnosis: Unsteadiness on feet (R26.81);Other abnormalities of gait and mobility (R26.89)   Activity Tolerance Patient tolerated treatment well   Patient Left in bed;with call bell/phone within reach;with family/visitor present   Nurse Communication Other (comment) (nurse cleared patient to participate)        Time: [id number] OT Time Calculation (min): 17 min  Charges: OT General Charges $OT Visit: 1 Visit OT Treatments $Self Care/Home Management : 8-22 mins  Jackelyn Poling OTR/L, MS Acute Rehabilitation Department Office# [phone] Pager#  [phone]    [place] 06/16/2021, 12:50 PM

## 2021-06-16 NOTE — Progress Notes (Signed)
Physical Therapy Treatment Patient Details Name: [name]. MRN: [id number] DOB: 10-20-1941 Today's Date: 06/16/2021    History of Present Illness 80 y.o. male admitted 06/08/21 with progressive weakness failure to thrive at home and generalized malaise since his surgery.  Per family he has been declining with poor oral intake.  Patient was noted to be dehydrated. Dx of SIRS. Pt  with medical history significant of bladder cancer s/p cystectomy, prostatectomy and ileal conduit surgery on May 19, 2021, coronary artery disease, hyperlipidemia, essential hypertension, obstructive sleep apnea, Tourette's syndrome.    PT Comments    Patient making steady progress with acute PT and ambulated ~70' total for short walk in hallway and in room to go to bathroom. Patient's HR elevated today with max of 160 bpm at EOS while walking to bathroom and recovered to 100's while on toilet and then 90's with seated rest once pt returned to recliner. He continues to require close guard for safety with transfers and min assist intermittently with gait. He will continue to benefit from skilled PT interventions to address impairments and progress mobility as able.    Follow Up Recommendations  SNF;Supervision/Assistance - 24 hour;Supervision for mobility/OOB     Equipment Recommendations  None recommended by PT    Recommendations for Other Services       Precautions / Restrictions Precautions Precautions: Fall Precaution Comments: ileal conduit/cystectomy 05/19/21 Restrictions Weight Bearing Restrictions: No    Mobility  Bed Mobility Overal bed mobility: Needs Assistance Bed Mobility: Sit to Supine Rolling: Supervision   Supine to sit: Min guard;Supervision;HOB elevated Sit to supine: Min guard   General bed mobility comments: extra time, guarding for safety with scoot to EOB    Transfers Overall transfer level: Needs assistance Equipment used: Rolling walker (2 wheeled) Transfers: Sit  to/from Stand Sit to Stand: Min guard Stand pivot transfers: Min guard;From elevated surface       General transfer comment: close guard for safety, pt with extra effort and use of bil UE to power up from EOB. cues to use grabn bar to rise from toilet and for safe reach back to sit in recliner.  Ambulation/Gait Ambulation/Gait assistance: Min guard;Min assist Gait Distance (Feet): 70 Feet (55, 15) Assistive device: Rolling walker (2 wheeled) Gait Pattern/deviations: Step-through pattern;Decreased stride length;Shuffle Gait velocity: decr   General Gait Details: cues for safe proximity to RW, min guard with ~25% assist for walker positioning. HR max of 141 bpm during gait in hallway and recovered to 100-110's with seated rest. HR elevated to 160 bpm on short walk to bathroom after exercises.   Stairs             Wheelchair Mobility    Modified Rankin (Stroke Patients Only)       Balance Overall balance assessment: Needs assistance Sitting-balance support: Feet supported;No upper extremity supported Sitting balance-Leahy Scale: Fair     Standing balance support: During functional activity;Bilateral upper extremity supported Standing balance-Leahy Scale: Poor Standing balance comment: relies on BUE support                            Cognition Arousal/Alertness: Awake/alert Behavior During Therapy: WFL for tasks assessed/performed Overall Cognitive Status: Impaired/Different from baseline Area of Impairment: Following commands;Problem solving                       Following Commands: Follows one step commands consistently;Follows one step commands with increased  time;Follows multi-step commands inconsistently     Problem Solving: Requires verbal cues       Exercises Other Exercises Other Exercises: 5 reps Sit<>Stand with bil UE use for power up and controled lowering.    General Comments        Pertinent Vitals/Pain Pain Assessment:  No/denies pain    Home Living                      Prior Function            PT Goals (current goals can now be found in the care plan section) Acute Rehab PT Goals Patient Stated Goal: to be able to go home PT Goal Formulation: With patient Time For Goal Achievement: 06/23/21 Potential to Achieve Goals: Good Progress towards PT goals: Progressing toward goals    Frequency    Min 2X/week      PT Plan Current plan remains appropriate    Co-evaluation              AM-PAC PT "6 Clicks" Mobility   Outcome Measure  Help needed turning from your back to your side while in a flat bed without using bedrails?: A Little Help needed moving from lying on your back to sitting on the side of a flat bed without using bedrails?: A Little Help needed moving to and from a bed to a chair (including a wheelchair)?: A Little Help needed standing up from a chair using your arms (e.g., wheelchair or bedside chair)?: A Little Help needed to walk in hospital room?: A Little Help needed climbing 3-5 steps with a railing? : A Lot 6 Click Score: 17    End of Session Equipment Utilized During Treatment: Gait belt Activity Tolerance: Patient tolerated treatment well Patient left: with family/visitor present;with call bell/phone within reach;in chair Nurse Communication: Mobility status PT Visit Diagnosis: Difficulty in walking, not elsewhere classified (R26.2);Adult, failure to thrive (R62.7);Other abnormalities of gait and mobility (R26.89)     Time: [id number] PT Time Calculation (min) (ACUTE ONLY): 26 min  Charges:  $Gait Training: 8-22 mins $Therapeutic Exercise: 8-22 mins                     Verner Mould, DPT Acute Rehabilitation Services Office [phone] Pager [phone]    [name] 06/16/2021, 12:46 PM

## 2021-06-16 NOTE — Progress Notes (Signed)
Patient states that chest tightness has resolved with Simethicone and patient is passing gas which has helped with tightness. Will continue to monitor patient.

## 2021-06-16 NOTE — Progress Notes (Signed)
Pt refused cpap tonight, he states he is doing well without it. Advised to let the RN know if he changes his mind and RT will come and help.

## 2021-06-16 NOTE — Progress Notes (Signed)
  Speech Language Pathology Treatment: Dysphagia  Patient Details Name: [name]. MRN: [id number] DOB: 07/10/1941 Today's Date: 06/16/2021 Time: 8242-3536 SLP Time Calculation (min) (ACUTE ONLY): 16 min  Assessment / Plan / Recommendation Clinical Impression  Patient sitting upright in bed, SLP visit to assure po tolerance s/p limited intubation. His voice today is strong without dysarthria.  Pt was given 3 ounce Yale water challenge to assess for potential silent aspiration risk - which he easily passed.  He denies "getting choked" when eating/drinking.  Recommend continue regular diet- advised he follow general aspiration/esophageal precautions given his advanced age - but no SLP follow up indicated.  Swallow goals are met and no further SLP indicated.    HPI HPI: 80 year old with medical history significant for bladder cancer status post cystectomy, prostatectomy and ileal conduit surgery on 05/19/2021, coronary artery disease, hyperlipidemia, essential hypertension, OSA on CPAP, Tourette's syndrome.  Pt was intubated from 8/13-8/14.      SLP Plan  Discharge SLP treatment due to (comment)       Recommendations  Diet recommendations: Regular;Thin liquid Liquids provided via: Cup;Straw Medication Administration: Whole meds with puree Supervision: Patient able to self feed;Intermittent supervision to cue for compensatory strategies Compensations: Minimize environmental distractions;Slow rate;Small sips/bites Postural Changes and/or Swallow Maneuvers: Seated upright 90 degrees                Oral Care Recommendations: Oral care BID Follow up Recommendations: None SLP Visit Diagnosis: Dysphagia, unspecified (R13.10) Plan: Discharge SLP treatment due to (comment)       GO               Kathleen Lime, MS [place] Office [phone] Pager [phone]  Macario Golds 06/16/2021, 5:55 PM

## 2021-06-17 LAB — BASIC METABOLIC PANEL
Anion gap: 6 (ref 5–15)
BUN: 15 mg/dL (ref 8–23)
CO2: 26 mmol/L (ref 22–32)
Calcium: 8.4 mg/dL — ABNORMAL LOW (ref 8.9–10.3)
Chloride: 104 mmol/L (ref 98–111)
Creatinine, Ser: 1.18 mg/dL (ref 0.61–1.24)
GFR, Estimated: >60 mL/min (ref >60)
Glucose, Bld: 139 mg/dL — ABNORMAL HIGH (ref 70–99)
Potassium: 4.4 mmol/L (ref 3.5–5.1)
Sodium: 136 mmol/L (ref 135–145)

## 2021-06-17 LAB — GLUCOSE, CAPILLARY
Glucose-Capillary: 92 mg/dL (ref 70–99)
Glucose-Capillary: 148 mg/dL — ABNORMAL HIGH (ref 70–99)
Glucose-Capillary: 104 mg/dL — ABNORMAL HIGH (ref 70–99)
Glucose-Capillary: 98 mg/dL (ref 70–99)

## 2021-06-17 NOTE — Progress Notes (Signed)
Pt refused cpap again tonight.

## 2021-06-17 NOTE — Progress Notes (Signed)
PROGRESS NOTE    [name].  [id number] DOB: October 20, 1941 DOA: 06/07/2021 PCP: Celene Squibb, MD    Brief Narrative:  Jafet Mcfeaters. is an 80 year old male with past medical history significant for bladder cancer s/p cystectomy/prostatectomy with ileal conduit and stent placement on 05/19/2021, CAD, essential hypertension, OSA, Tourette syndrome who presented to [facility] ED on 8/10 with progressive weakness, cloudy urine with inability to ambulate and adult failure to thrive.  In the ED, temperature 98.2 F, BP 97/85, HR 114, RR 21, SPO2 93% on room air.  WBC count 15.1, hemoglobin 11.1, platelets 331.  BUN 36, otherwise BMP within normal limits.  Lactic acid 2.5.  CT head without contrast with no acute intracranial findings.  CT abdomen/pelvis with interval prostatectomy/cystectomy with right lower quadrant urostomy in place; mild right and moderate left hydronephrosis with stents in place with infrarenal abdominal aortic aneurysm 3.7 cm.  Urology was consulted for evaluation.  TRH consulted for further evaluation management of progressive weakness, adult failure to thrive, and sepsis secondary to UTI.   Assessment & Plan:   Principal Problem:   SIRS (systemic inflammatory response syndrome) (HCC) Active Problems:   OSA (obstructive sleep apnea)   Bladder cancer (HCC)   Mixed hyperlipidemia   Essential hypertension   Type 2 diabetes mellitus (HCC)   Malnutrition of moderate degree   Shock circulatory (HCC)   Hemorrhagic shock (HCC)   Duodenal ulcer   Acute duodenal ulcer with bleeding   Acute blood loss anemia   Sepsis, POA Enterococcus UTI Patient presenting to ED with progressive weakness and was found to have elevated heart rate, low blood pressure, elevated white count of 15.1.  Urine culture with Enterococcus.  Initially started on ceftriaxone which was broadened to cefepime; and subsequently de-escalated to Bactrim in accordance with culture susceptibilities.   Patient completed 7-day course of total antibiotics..  Acute respiratory failure requiring intubation due to encephalopathy: Resolved. Patient developed significant confusion on 06/10/2021 and was subsequently intubated due to respiratory compromise and inability to protect airway.  Patient was successfully extubated on 06/11/2021.  Likely complicated by hemorrhagic shock as below.  Now oxygenating well on room air.  Hemorrhagic shock 2/2 gastric ulcer During hospitalization, patient developed confusion, altered mental status and was found to have drop in hemoglobin.  Patient was transfused total 4 units PRBCs during hospitalization.  Gastroenterology was consulted and underwent EGD showing an active bleeding ulcer which was endoscopically cauterized.  Patient did require pressors temporarily which were weaned off.  H. pylori negative. --Hgb 11.6>>>4.0>11.1>>10.3>>9.0>9.1>9.6>9.4 stable --Protonix 40 mg p.o. twice daily  Type 2 diabetes mellitus Hemoglobin A1c 6.2, well controlled.  Diet controlled at home. --Levemir 5u [place] daily --Novolog 2u TIDAC --SSI for coverage --CBGs qAC/HS  Essential hypertension BP 102/63 this morning, well controlled off of antihypertensives. --Continue to hold home metoprolol 25 mg p.o. daily, lisinopril 2.5 mg p.o. daily. --Continue to closely monitor BP, will start antihypertensives as needed  OSA: Continue nocturnal CPAP   CAD --Resume home simvastatin 20 mg p.o. nightly --Holding home aspirin due to gastric ulcer as above  Tourette's syndrome --Supportive care  Hx of bladder cancer Patient recently underwent cystectomy/prostatectomy with ileal conduit and stent placement on 05/19/2021, Urology; Dr. Tresa Moore.  --Wound ostomy consulted for continued ostomy care --Closely monitor urine output through ostomy --Outpatient follow-up with urology, Dr. Tresa Moore in 2 weeks after discharge  Weakness/deconditioning/debility/gait disturbance: Patient initially  presented to the ED with progressive adult failure to thrive, increasing  weakness, malaise following recent surgery as above.  Patient's been evaluated by PT/OT with recommendations of SNF for further rehabilitation.  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 --Plan discharge to [facility]; ending insurance authorization --Continue PT/OT efforts while inpatient   DVT prophylaxis: Place and maintain sequential compression device Start: 06/10/21 0831   Code Status: Full Code Family Communication: No family present at bedside this morning.  Disposition Plan:  Level of care: Med-Surg Status is: Inpatient  Remains inpatient appropriate because:Unsafe d/c plan and Inpatient level of care appropriate due to severity of illness  Dispo: The patient is from: Home              Anticipated d/c is to: SNF              Patient currently is medically stable to d/c.   Difficult to place patient No  Consultants:  PCCM Urology GI -  Dr. Benson Norway  Procedures:  Intubation 8/13 Extubation 8/14 EGD 8/13, Dr. Henrene Pastor, giant duodenal bulb ulcer with visible vessel and bleeding s/p endoscopic hemostatic therapy  Antimicrobials:  Cefepime 8/13>> Erythromycin 8/13 - 8/13 Ceftriaxone 8/10 - 8/13   Subjective: Patient seen examined at bedside, resting comfortably.  No family present.  Patient sitting in bedside chair.  No complaints or concerns at this time.  Awaiting insurance authorization for SNF placement.  Denies headache, no fever/chills/night sweats, no nausea/vomiting/diarrhea, no chest pain, no palpitations, no shortness of breath, no abdominal pain.  No acute concerns overnight per nursing staff.  Objective: Vitals:   06/16/21 1928 06/17/21 0500 06/17/21 0507 06/17/21 1320  BP: 114/64  120/79 109/71  Pulse: 82  79 95  Resp: '20  20 18  '$ Temp: 98.7 F (37.1 C)  98.1 F (36.7 C) 99.1 F (37.3 C)  TempSrc: Oral   Oral Oral  SpO2: 98%  99%   Weight:  78.2 kg    Height:        Intake/Output Summary (Last 24 hours) at 06/17/2021 1335 Last data filed at 06/17/2021 0700 Gross per 24 hour  Intake 240 ml  Output 775 ml  Net -535 ml   Filed Weights   06/15/21 0513 06/16/21 0504 06/17/21 0500  Weight: 88.7 kg 82.2 kg 78.2 kg    Examination:  General exam: Appears calm and comfortable, weak/ill in appearance Respiratory system: Clear to auscultation. Respiratory effort normal.  On room air Cardiovascular system: S1 & S2 heard, RRR. No JVD, murmurs, rubs, gallops or clicks. No pedal edema. Gastrointestinal system: Abdomen is nondistended, soft and nontender. No organomegaly or masses felt. Normal bowel sounds heard.  Urostomy noted with clear yellow urine in urostomy bag with stents noted in bag Central nervous system: Alert and oriented. No focal neurological deficits. Extremities: Symmetric 5 x 5 power. Skin: No rashes, lesions or ulcers Psychiatry: Judgement and insight appear normal. Mood & affect appropriate.     Data Reviewed: I have personally reviewed following labs and imaging studies  CBC: Recent Labs  Lab 06/11/21 0331 06/11/21 0950 06/12/21 0330 06/12/21 0942 06/13/21 1609 06/13/21 2136 06/14/21 0514 06/14/21 1028 06/15/21 0758  WBC 19.1*  --  16.9*  --   --   --   --   --  11.0*  HGB 9.9*   < > 9.2*   < > 9.7* 9.0* 9.1* 9.6* 9.4*  HCT 29.2*   < > 28.0*   < > 30.3* 27.8*  28.1* 30.2* 29.1*  MCV 86.4  --  89.7  --   --   --   --   --  90.1  PLT 187  --  174  --   --   --   --   --  209   < > = values in this interval not displayed.   Basic Metabolic Panel: Recent Labs  Lab 06/11/21 0329 06/12/21 0330 06/13/21 0350 06/14/21 0514 06/15/21 0758  NA 143 141  --   --  139  K 3.4* 3.8  --   --  3.0*  CL 113* 115*  --   --  106  CO2 23 23  --   --  27  GLUCOSE 82 131*  --   --  90  BUN 41* 32*  --   --  12  CREATININE 1.13 1.15  --   --  0.67  CALCIUM 7.1* 7.3*  --    --  7.6*  MG 2.1 1.9  --   --  1.5*  PHOS 2.6 2.9 2.1* 1.7* 2.3*   GFR: Estimated Creatinine Clearance: 73.5 mL/min (by C-G formula based on SCr of 0.67 mg/dL). Liver Function Tests: Recent Labs  Lab 06/11/21 0329 06/12/21 0330  AST 40 23  ALT 42 33  ALKPHOS 52 51  BILITOT 0.9 0.9  PROT 4.0* 4.3*  ALBUMIN 1.8* 2.0*   No results for input(s): LIPASE, AMYLASE in the last 168 hours.  No results for input(s): AMMONIA in the last 168 hours.  Coagulation Profile: Recent Labs  Lab 06/10/21 1519  INR 1.5*   Cardiac Enzymes: No results for input(s): CKTOTAL, CKMB, CKMBINDEX, TROPONINI in the last 168 hours. BNP (last 3 results) No results for input(s): PROBNP in the last 8760 hours. HbA1C: No results for input(s): HGBA1C in the last 72 hours. CBG: Recent Labs  Lab 06/16/21 1137 06/16/21 1659 06/16/21 2134 06/17/21 0728 06/17/21 1129  GLUCAP 134* 75 100* 92 148*   Lipid Profile: No results for input(s): CHOL, HDL, LDLCALC, TRIG, CHOLHDL, LDLDIRECT in the last 72 hours. Thyroid Function Tests: No results for input(s): TSH, T4TOTAL, FREET4, T3FREE, THYROIDAB in the last 72 hours. Anemia Panel: No results for input(s): VITAMINB12, FOLATE, FERRITIN, TIBC, IRON, RETICCTPCT in the last 72 hours. Sepsis Labs: Recent Labs  Lab 06/10/21 1518 06/11/21 0327 06/11/21 0329  PROCALCITON  --   --  12.50  LATICACIDVEN 2.8* 1.2  --     Recent Results (from the past 240 hour(s))  Urine Culture     Status: Abnormal   Collection Time: 06/07/21  5:35 PM   Specimen: In/Out Cath Urine  Result Value Ref Range Status   Specimen Description   Final    IN/OUT CATH URINE Performed at [facility], [place] [address], [place], [place] [address]    Special Requests   Final    NONE Performed at [facility], [place] [address] [place], [place] [address]    Culture >=100,000 COLONIES/mL ENTEROBACTER CLOACAE (A)  Final   Report Status 06/10/2021 FINAL   Final   Organism ID, Bacteria ENTEROBACTER CLOACAE (A)  Final      Susceptibility   Enterobacter cloacae - MIC*    CEFAZOLIN >=64 RESISTANT Resistant     CEFEPIME <=0.12 SENSITIVE Sensitive     CIPROFLOXACIN <=0.25 SENSITIVE Sensitive     GENTAMICIN <=1 SENSITIVE Sensitive     IMIPENEM 0.5 SENSITIVE Sensitive     NITROFURANTOIN 32 SENSITIVE Sensitive  TRIMETH/SULFA <=20 SENSITIVE Sensitive     PIP/TAZO <=4 SENSITIVE Sensitive     * >=100,000 COLONIES/mL ENTEROBACTER CLOACAE  Blood Culture (routine x 2)     Status: None   Collection Time: 06/07/21  5:40 PM   Specimen: BLOOD RIGHT FOREARM  Result Value Ref Range Status   Specimen Description BLOOD RIGHT FOREARM  Final   Special Requests   Final    BOTTLES DRAWN AEROBIC AND ANAEROBIC Blood Culture results may not be optimal due to an excessive volume of blood received in culture bottles   Culture   Final    NO GROWTH 5 DAYS Performed at [facility], [place] [address] [place], [place] [address]    Report Status 06/12/2021 FINAL  Final  Blood Culture (routine x 2)     Status: None   Collection Time: 06/07/21  5:55 PM   Specimen: BLOOD  Result Value Ref Range Status   Specimen Description   Final    BLOOD LEFT ANTECUBITAL Performed at [place] [address] [place], [place] [address]    Special Requests   Final    BOTTLES DRAWN AEROBIC AND ANAEROBIC Blood Culture adequate volume   Culture   Final    NO GROWTH 5 DAYS Performed at [facility], [place] [address], [place],  [address]    Report Status 06/12/2021 FINAL  Final  Resp Panel by RT-PCR (Flu A&B, Covid) Nasopharyngeal Swab     Status: None   Collection Time: 06/07/21  6:30 PM   Specimen: Nasopharyngeal Swab; Nasopharyngeal(NP) swabs in vial transport medium  Result Value Ref Range Status   SARS Coronavirus 2 by RT PCR NEGATIVE NEGATIVE Final    Comment: (NOTE) SARS-CoV-2 target nucleic acids are NOT DETECTED.  The SARS-CoV-2  RNA is generally detectable in upper respiratory specimens during the acute phase of infection. The lowest concentration of SARS-CoV-2 viral copies this assay can detect is 138 copies/mL. A negative result does not preclude SARS-Cov-2 infection and should not be used as the sole basis for treatment or other patient management decisions. A negative result may occur with  improper specimen collection/handling, submission of specimen other than nasopharyngeal swab, presence of viral mutation(s) within the areas targeted by this assay, and inadequate number of viral copies(<138 copies/mL). A negative result must be combined with clinical observations, patient history, and epidemiological information. The expected result is Negative.  Fact Sheet for Patients:  EntrepreneurPulse.com.au  Fact Sheet for Healthcare Providers:  IncredibleEmployment.be  This test is no t yet approved or cleared by the [place] FDA and  has been authorized for detection and/or diagnosis of SARS-CoV-2 by FDA under an Emergency Use Authorization (EUA). This EUA will remain  in effect (meaning this test can be used) for the duration of the COVID-19 declaration under Section 564(b)(1) of the Act, 21 U.S.C.section 360bbb-3(b)(1), unless the authorization is terminated  or revoked sooner.       Influenza A by PCR NEGATIVE NEGATIVE Final   Influenza B by PCR NEGATIVE NEGATIVE Final    Comment: (NOTE) The Xpert Xpress SARS-CoV-2/FLU/RSV plus assay is intended as an aid in the diagnosis of influenza from Nasopharyngeal swab specimens and should not be used as a sole basis for treatment. Nasal washings and aspirates are unacceptable for Xpert Xpress SARS-CoV-2/FLU/RSV testing.  Fact Sheet for Patients: EntrepreneurPulse.com.au  Fact Sheet for Healthcare Providers: IncredibleEmployment.be  This test is not yet approved or cleared by the  [place] and  has been authorized for detection and/or diagnosis of SARS-CoV-2 by FDA under an Emergency Use Authorization (EUA). This EUA will remain in effect (meaning this test can be used) for the duration of the COVID-19 declaration under Section 564(b)(1) of the Act, 21 U.S.C. section 360bbb-3(b)(1), unless the authorization is terminated or revoked.  Performed at [facility], [place] [address] [place], [place] [address]   MRSA Next Gen by PCR, Nasal     Status: Abnormal   Collection Time: 06/10/21  8:46 AM   Specimen: Nasal Mucosa; Nasal Swab  Result Value Ref Range Status   MRSA by PCR Next Gen DETECTED (A) NOT DETECTED Final    Comment: RESULT CALLED TO, READ BACK BY AND VERIFIED WITH: Leonie Man, RN 1036 06/10/21 KDS (NOTE) The GeneXpert MRSA Assay (FDA approved for NASAL specimens only), is one component of a comprehensive MRSA colonization surveillance program. It is not intended to diagnose MRSA infection nor to guide or monitor treatment for MRSA infections. Test performance is not FDA approved in patients less than 86 years old. Performed at [facility], [place] [address] [place], [place] [address]          Radiology Studies: No results found.      Scheduled Meds:  Chlorhexidine Gluconate Cloth  6 each Topical Daily   insulin aspart  0-5 Units Subcutaneous QHS   insulin aspart  0-9 Units Subcutaneous TID WC   insulin aspart  2 Units Subcutaneous TID WC   insulin detemir  5 Units Subcutaneous Daily   mouth rinse  15 mL Mouth Rinse q12n4p   metoprolol succinate  25 mg Oral Daily   pantoprazole  40 mg Oral BID   simvastatin  20 mg Oral QHS   Continuous Infusions:  sodium chloride Stopped (06/10/21 1106)     LOS: 10 days    Time spent: 39 minutes spent on chart review, discussion with nursing staff, consultants, updating family and interview/physical exam; more than 50% of that time was spent in  counseling and/or coordination of care.    Makhia Vosler J [place] DO [facility] Available via Epic secure chat 7am-7pm After these hours, please refer to coverage provider listed on [email or web address] 06/17/2021, 1:35 PM

## 2021-06-18 LAB — GLUCOSE, CAPILLARY
Glucose-Capillary: 118 mg/dL — ABNORMAL HIGH (ref 70–99)
Glucose-Capillary: 88 mg/dL (ref 70–99)
Glucose-Capillary: 118 mg/dL — ABNORMAL HIGH (ref 70–99)
Glucose-Capillary: 82 mg/dL (ref 70–99)
Glucose-Capillary: 86 mg/dL (ref 70–99)
Glucose-Capillary: 114 mg/dL — ABNORMAL HIGH (ref 70–99)

## 2021-06-18 NOTE — Progress Notes (Signed)
PROGRESS NOTE    [name].  [id number] DOB: Jan 28, 1941 DOA: 06/07/2021 PCP: Celene Squibb, MD    Brief Narrative:  [name]. is an 80 year old male with past medical history significant for bladder cancer s/p cystectomy/prostatectomy with ileal conduit and stent placement on 05/19/2021, CAD, essential hypertension, OSA, Tourette syndrome who presented to [facility] ED on 8/10 with progressive weakness, cloudy urine with inability to ambulate and adult failure to thrive.  In the ED, temperature 98.2 F, BP 97/85, HR 114, RR 21, SPO2 93% on room air.  WBC count 15.1, hemoglobin 11.1, platelets 331.  BUN 36, otherwise BMP within normal limits.  Lactic acid 2.5.  CT head without contrast with no acute intracranial findings.  CT abdomen/pelvis with interval prostatectomy/cystectomy with right lower quadrant urostomy in place; mild right and moderate left hydronephrosis with stents in place with infrarenal abdominal aortic aneurysm 3.7 cm.  Urology was consulted for evaluation.  TRH consulted for further evaluation management of progressive weakness, adult failure to thrive, and sepsis secondary to UTI.   Assessment & Plan:   Principal Problem:   SIRS (systemic inflammatory response syndrome) (HCC) Active Problems:   OSA (obstructive sleep apnea)   Bladder cancer (HCC)   Mixed hyperlipidemia   Essential hypertension   Type 2 diabetes mellitus (HCC)   Malnutrition of moderate degree   Shock circulatory (HCC)   Hemorrhagic shock (HCC)   Duodenal ulcer   Acute duodenal ulcer with bleeding   Acute blood loss anemia   Sepsis, POA Enterococcus UTI Patient presenting to ED with progressive weakness and was found to have elevated heart rate, low blood pressure, elevated white count of 15.1.  Urine culture with Enterococcus.  Initially started on ceftriaxone which was broadened to cefepime; and subsequently de-escalated to Bactrim in accordance with culture susceptibilities.   Patient completed 7-day course of total antibiotics..  Acute respiratory failure requiring intubation due to encephalopathy: Resolved. Patient developed significant confusion on 06/10/2021 and was subsequently intubated due to respiratory compromise and inability to protect airway.  Patient was successfully extubated on 06/11/2021.  Likely complicated by hemorrhagic shock as below.  Now oxygenating well on room air.  Hemorrhagic shock 2/2 gastric ulcer During hospitalization, patient developed confusion, altered mental status and was found to have drop in hemoglobin.  Patient was transfused total 4 units PRBCs during hospitalization.  Gastroenterology was consulted and underwent EGD showing an active bleeding ulcer which was endoscopically cauterized.  Patient did require pressors temporarily which were weaned off.  H. pylori negative. --Hgb 11.6>>>4.0>11.1>>10.3>>9.0>9.1>9.6>9.4 stable --Protonix 40 mg p.o. twice daily  Type 2 diabetes mellitus Hemoglobin A1c 6.2, well controlled.  Diet controlled at home. --Levemir 5u [place] daily --Novolog 2u TIDAC --SSI for coverage --CBGs qAC/HS  Essential hypertension BP 134/86 this morning, well controlled off of antihypertensives. --Continue to hold home metoprolol 25 mg p.o. daily, lisinopril 2.5 mg p.o. daily. --Continue to closely monitor BP, will start antihypertensives as needed  OSA: Continue nocturnal CPAP   CAD --Resume home simvastatin 20 mg p.o. nightly --Holding home aspirin due to gastric ulcer as above  Tourette's syndrome --Supportive care  Hx of bladder cancer Patient recently underwent cystectomy/prostatectomy with ileal conduit and stent placement on 05/19/2021, Urology; Dr. Tresa Moore.  --Wound ostomy consulted for continued ostomy care --Closely monitor urine output through ostomy --Outpatient follow-up with urology, Dr. Tresa Moore in 2 weeks after discharge  Weakness/deconditioning/debility/gait disturbance: Patient initially  presented to the ED with progressive adult failure to thrive, increasing  weakness, malaise following recent surgery as above.  Patient's been evaluated by PT/OT with recommendations of SNF for further rehabilitation.  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 --Plan discharge to [facility]; ending insurance authorization --Continue PT/OT efforts while inpatient   DVT prophylaxis: Place and maintain sequential compression device Start: 06/10/21 0831   Code Status: Full Code Family Communication: No family present at bedside this morning.  Disposition Plan:  Level of care: Med-Surg Status is: Inpatient  Remains inpatient appropriate because:Unsafe d/c plan and Inpatient level of care appropriate due to severity of illness  Dispo: The patient is from: Home              Anticipated d/c is to: SNF              Patient currently is medically stable to d/c.   Difficult to place patient No  Consultants:  PCCM Urology GI -  Dr. Benson Norway  Procedures:  Intubation 8/13 Extubation 8/14 EGD 8/13, Dr. Henrene Pastor, giant duodenal bulb ulcer with visible vessel and bleeding s/p endoscopic hemostatic therapy  Antimicrobials:  Cefepime 8/13>> Erythromycin 8/13 - 8/13 Ceftriaxone 8/10 - 8/13   Subjective: Patient seen examined at bedside, resting comfortably.  No family present.  Lying in bed eating breakfast.  No complaints or concerns at this time.  Awaiting insurance authorization for SNF placement.  Denies headache, no fever/chills/night sweats, no nausea/vomiting/diarrhea, no chest pain, no palpitations, no shortness of breath, no abdominal pain.  No acute concerns overnight per nursing staff.  Objective: Vitals:   06/18/21 0140 06/18/21 0459 06/18/21 0700 06/18/21 0928  BP: (!) 108/59 134/86  96/62  Pulse: 93 67  74  Resp: '20 20  18  '$ Temp: 98.7 F (37.1 C) 98.2 F (36.8 C)  98.2 F (36.8 C)  TempSrc:  Oral   Oral  SpO2: 99% 100%  100%  Weight:   78.6 kg   Height:        Intake/Output Summary (Last 24 hours) at 06/18/2021 1127 Last data filed at 06/18/2021 0900 Gross per 24 hour  Intake 600 ml  Output 1300 ml  Net -700 ml   Filed Weights   06/16/21 0504 06/17/21 0500 06/18/21 0700  Weight: 82.2 kg 78.2 kg 78.6 kg    Examination:  General exam: Appears calm and comfortable, no acute distress Respiratory system: Clear to auscultation. Respiratory effort normal.  On room air Cardiovascular system: S1 & S2 heard, RRR. No JVD, murmurs, rubs, gallops or clicks. No pedal edema. Gastrointestinal system: Abdomen is nondistended, soft and nontender. No organomegaly or masses felt. Normal bowel sounds heard.  Urostomy noted with clear yellow urine in urostomy bag with stents noted in bag Central nervous system: Alert and oriented. No focal neurological deficits. Extremities: Symmetric 5 x 5 power. Skin: No rashes, lesions or ulcers Psychiatry: Judgement and insight appear normal. Mood & affect appropriate.     Data Reviewed: I have personally reviewed following labs and imaging studies  CBC: Recent Labs  Lab 06/12/21 0330 06/12/21 0942 06/13/21 1609 06/13/21 2136 06/14/21 0514 06/14/21 1028 06/15/21 0758  WBC 16.9*  --   --   --   --   --  11.0*  HGB 9.2*   < > 9.7* 9.0* 9.1* 9.6* 9.4*  HCT 28.0*   < > 30.3* 27.8* 28.1* 30.2* 29.1*  MCV 89.7  --   --   --   --   --  90.1  PLT 174  --   --   --   --   --  209   < > = values in this interval not displayed.   Basic Metabolic Panel: Recent Labs  Lab 06/12/21 0330 06/13/21 0350 06/14/21 0514 06/15/21 0758 06/17/21 2207  NA 141  --   --  139 136  K 3.8  --   --  3.0* 4.4  CL 115*  --   --  106 104  CO2 23  --   --  27 26  GLUCOSE 131*  --   --  90 139*  BUN 32*  --   --  12 15  CREATININE 1.15  --   --  0.67 1.18  CALCIUM 7.3*  --   --  7.6* 8.4*  MG 1.9  --   --  1.5*  --   PHOS 2.9 2.1* 1.7* 2.3*  --    GFR: Estimated  Creatinine Clearance: 49.8 mL/min (by C-G formula based on SCr of 1.18 mg/dL). Liver Function Tests: Recent Labs  Lab 06/12/21 0330  AST 23  ALT 33  ALKPHOS 51  BILITOT 0.9  PROT 4.3*  ALBUMIN 2.0*   No results for input(s): LIPASE, AMYLASE in the last 168 hours.  No results for input(s): AMMONIA in the last 168 hours.  Coagulation Profile: No results for input(s): INR, PROTIME in the last 168 hours.  Cardiac Enzymes: No results for input(s): CKTOTAL, CKMB, CKMBINDEX, TROPONINI in the last 168 hours. BNP (last 3 results) No results for input(s): PROBNP in the last 8760 hours. HbA1C: No results for input(s): HGBA1C in the last 72 hours. CBG: Recent Labs  Lab 06/17/21 1129 06/17/21 1625 06/17/21 2136 06/18/21 0127 06/18/21 0827  GLUCAP 148* 104* 98 118* 88   Lipid Profile: No results for input(s): CHOL, HDL, LDLCALC, TRIG, CHOLHDL, LDLDIRECT in the last 72 hours. Thyroid Function Tests: No results for input(s): TSH, T4TOTAL, FREET4, T3FREE, THYROIDAB in the last 72 hours. Anemia Panel: No results for input(s): VITAMINB12, FOLATE, FERRITIN, TIBC, IRON, RETICCTPCT in the last 72 hours. Sepsis Labs: No results for input(s): PROCALCITON, LATICACIDVEN in the last 168 hours.   Recent Results (from the past 240 hour(s))  MRSA Next Gen by PCR, Nasal     Status: Abnormal   Collection Time: 06/10/21  8:46 AM   Specimen: Nasal Mucosa; Nasal Swab  Result Value Ref Range Status   MRSA by PCR Next Gen DETECTED (A) NOT DETECTED Final    Comment: RESULT CALLED TO, READ BACK BY AND VERIFIED WITH: Leonie Man, RN 1036 06/10/21 KDS (NOTE) The GeneXpert MRSA Assay (FDA approved for NASAL specimens only), is one component of a comprehensive MRSA colonization surveillance program. It is not intended to diagnose MRSA infection nor to guide or monitor treatment for MRSA infections. Test performance is not FDA approved in patients less than 81 years old. Performed at [facility], [place] [address] [place], [place] [address]          Radiology Studies: No results found.      Scheduled Meds:  Chlorhexidine Gluconate Cloth  6 each Topical Daily   insulin aspart  0-5 Units Subcutaneous QHS   insulin aspart  0-9 Units Subcutaneous TID WC   insulin aspart  2 Units Subcutaneous TID WC   insulin detemir  5 Units Subcutaneous Daily   mouth rinse  15 mL Mouth Rinse q12n4p   metoprolol succinate  25 mg Oral Daily  pantoprazole  40 mg Oral BID   simvastatin  20 mg Oral QHS   Continuous Infusions:  sodium chloride Stopped (06/10/21 1106)     LOS: 11 days    Time spent: 35 minutes spent on chart review, discussion with nursing staff, consultants, updating family and interview/physical exam; more than 50% of that time was spent in counseling and/or coordination of care.    Idabelle Mcpeters J [place] DO [facility] Available via Epic secure chat 7am-7pm After these hours, please refer to coverage provider listed on [email or web address] 06/18/2021, 11:27 AM

## 2021-06-18 NOTE — Progress Notes (Addendum)
Tele tech called and mentioned that Patient's heart rhythm changed to a-fib, EKG done, on call provider notified. BMP order noted, no new change, patient is stable will continue to monitor.

## 2021-06-18 NOTE — Progress Notes (Signed)
Pt refused cpap tonight. 

## 2021-06-19 LAB — GLUCOSE, CAPILLARY
Glucose-Capillary: 87 mg/dL (ref 70–99)
Glucose-Capillary: 129 mg/dL — ABNORMAL HIGH (ref 70–99)

## 2021-06-19 MED ORDER — PANTOPRAZOLE SODIUM 40 MG PO TBEC: DELAYED_RELEASE_TABLET | ORAL | 0 refills | Status: AC

## 2021-06-19 NOTE — Progress Notes (Signed)
PROGRESS NOTE    [name].  [id number] DOB: 1941/07/27 DOA: 06/07/2021 PCP: Celene Squibb, MD    Brief Narrative:  [name]. is an 80 year old male with past medical history significant for bladder cancer s/p cystectomy/prostatectomy with ileal conduit and stent placement on 05/19/2021, CAD, essential hypertension, OSA, Tourette syndrome who presented to [facility] ED on 8/10 with progressive weakness, cloudy urine with inability to ambulate and adult failure to thrive.  In the ED, temperature 98.2 F, BP 97/85, HR 114, RR 21, SPO2 93% on room air.  WBC count 15.1, hemoglobin 11.1, platelets 331.  BUN 36, otherwise BMP within normal limits.  Lactic acid 2.5.  CT head without contrast with no acute intracranial findings.  CT abdomen/pelvis with interval prostatectomy/cystectomy with right lower quadrant urostomy in place; mild right and moderate left hydronephrosis with stents in place with infrarenal abdominal aortic aneurysm 3.7 cm.  Urology was consulted for evaluation.  TRH consulted for further evaluation management of progressive weakness, adult failure to thrive, and sepsis secondary to UTI.   Assessment & Plan:   Principal Problem:   SIRS (systemic inflammatory response syndrome) (HCC) Active Problems:   OSA (obstructive sleep apnea)   Bladder cancer (HCC)   Mixed hyperlipidemia   Essential hypertension   Type 2 diabetes mellitus (HCC)   Malnutrition of moderate degree   Shock circulatory (HCC)   Hemorrhagic shock (HCC)   Duodenal ulcer   Acute duodenal ulcer with bleeding   Acute blood loss anemia   Sepsis, POA Enterococcus UTI Patient presenting to ED with progressive weakness and was found to have elevated heart rate, low blood pressure, elevated white count of 15.1.  Urine culture with Enterococcus.  Initially started on ceftriaxone which was broadened to cefepime; and subsequently de-escalated to Bactrim in accordance with culture susceptibilities.   Patient completed 7-day course of total antibiotics..  Acute respiratory failure requiring intubation due to encephalopathy: Resolved. Patient developed significant confusion on 06/10/2021 and was subsequently intubated due to respiratory compromise and inability to protect airway.  Patient was successfully extubated on 06/11/2021.  Likely complicated by hemorrhagic shock as below.  Now oxygenating well on room air.  Hemorrhagic shock 2/2 gastric ulcer During hospitalization, patient developed confusion, altered mental status and was found to have drop in hemoglobin.  Patient was transfused total 4 units PRBCs during hospitalization.  Gastroenterology was consulted and underwent EGD showing an active bleeding ulcer which was endoscopically cauterized.  Patient did require pressors temporarily which were weaned off.  H. pylori negative. --Hgb 11.6>>>4.0>11.1>>10.3>>9.0>9.1>9.6>9.4 stable --Protonix 40 mg p.o. twice daily  Type 2 diabetes mellitus Hemoglobin A1c 6.2, well controlled.  Diet controlled at home. --Levemir 5u [place] daily --Novolog 2u TIDAC --SSI for coverage --CBGs qAC/HS  Essential hypertension BP 134/86 this morning, well controlled off of antihypertensives. --Continue to hold home metoprolol 25 mg p.o. daily, lisinopril 2.5 mg p.o. daily. --Continue to closely monitor BP, will start antihypertensives as needed  OSA: Continue nocturnal CPAP   CAD --Resume home simvastatin 20 mg p.o. nightly --Holding home aspirin due to gastric ulcer as above  Tourette's syndrome --Supportive care  Hx of bladder cancer Patient recently underwent cystectomy/prostatectomy with ileal conduit and stent placement on 05/19/2021, Urology; Dr. Tresa Moore.  --Wound ostomy consulted for continued ostomy care --Closely monitor urine output through ostomy --Outpatient follow-up with urology, Dr. Tresa Moore in 2 weeks after discharge  Weakness/deconditioning/debility/gait disturbance: Patient initially  presented to the ED with progressive adult failure to thrive, increasing  weakness, malaise following recent surgery as above.  Patient's been evaluated by PT/OT with recommendations of SNF for further rehabilitation.  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 --Plan discharge to [facility]; ending insurance authorization --Continue PT/OT efforts while inpatient   DVT prophylaxis: Place and maintain sequential compression device Start: 06/10/21 0831   Code Status: Full Code Family Communication: No family present at bedside this morning.  Disposition Plan:  Level of care: Med-Surg Status is: Inpatient  Remains inpatient appropriate because:Unsafe d/c plan and Inpatient level of care appropriate due to severity of illness  Dispo: The patient is from: Home              Anticipated d/c is to: SNF              Patient currently is medically stable to d/c.   Difficult to place patient No  Consultants:  PCCM Urology GI -  Dr. Benson Norway  Procedures:  Intubation 8/13 Extubation 8/14 EGD 8/13, Dr. Henrene Pastor, giant duodenal bulb ulcer with visible vessel and bleeding s/p endoscopic hemostatic therapy  Antimicrobials:  Cefepime 8/13>> Erythromycin 8/13 - 8/13 Ceftriaxone 8/10 - 8/13   Subjective: Patient seen examined at bedside, resting comfortably.  No family present.  Lying in bed.  Awaiting for breakfast to arrive.  States this morning anxious to discharge somewhere today.  Discussed there were still waiting insurance authorization; patient states may elect to just discharge home and he will "figure it out".  No other complaints or concerns at this time.   Denies headache, no fever/chills/night sweats, no nausea/vomiting/diarrhea, no chest pain, no palpitations, no shortness of breath, no abdominal pain.  No acute concerns overnight per nursing staff.  Objective: Vitals:   06/18/21 1942 06/19/21 0500 06/19/21  0508 06/19/21 0910  BP: 125/87  139/79 108/78  Pulse: 80  68 95  Resp: '20  20 16  '$ Temp: 98.2 F (36.8 C)  97.6 F (36.4 C)   TempSrc: Oral  Oral   SpO2: 99%  95% 98%  Weight:  76.7 kg    Height:        Intake/Output Summary (Last 24 hours) at 06/19/2021 1304 Last data filed at 06/19/2021 1200 Gross per 24 hour  Intake 820 ml  Output 2600 ml  Net -1780 ml   Filed Weights   06/17/21 0500 06/18/21 0700 06/19/21 0500  Weight: 78.2 kg 78.6 kg 76.7 kg    Examination:  General exam: Appears calm and comfortable, no acute distress Respiratory system: Clear to auscultation. Respiratory effort normal.  On room air Cardiovascular system: S1 & S2 heard, RRR. No JVD, murmurs, rubs, gallops or clicks. No pedal edema. Gastrointestinal system: Abdomen is nondistended, soft and nontender. No organomegaly or masses felt. Normal bowel sounds heard.  Urostomy noted with clear yellow urine in urostomy bag with stents noted in bag Central nervous system: Alert and oriented. No focal neurological deficits. Extremities: Symmetric 5 x 5 power. Skin: No rashes, lesions or ulcers Psychiatry: Judgement and insight appear normal. Mood & affect appropriate.     Data Reviewed: I have personally reviewed following labs and imaging studies  CBC: Recent Labs  Lab 06/13/21 1609 06/13/21 2136 06/14/21 0514 06/14/21 1028 06/15/21 0758  WBC  --   --   --   --  11.0*  HGB 9.7* 9.0* 9.1* 9.6* 9.4*  HCT 30.3* 27.8* 28.1* 30.2* 29.1*  MCV  --   --   --   --  90.1  PLT  --   --   --   --  [id number]   Basic Metabolic Panel: Recent Labs  Lab 06/13/21 0350 06/14/21 0514 06/15/21 0758 06/17/21 2207  NA  --   --  139 136  K  --   --  3.0* 4.4  CL  --   --  106 104  CO2  --   --  27 26  GLUCOSE  --   --  90 139*  BUN  --   --  12 15  CREATININE  --   --  0.67 1.18  CALCIUM  --   --  7.6* 8.4*  MG  --   --  1.5*  --   PHOS 2.1* 1.7* 2.3*  --    GFR: Estimated Creatinine Clearance: 49.8 mL/min (by C-G  formula based on SCr of 1.18 mg/dL). Liver Function Tests: No results for input(s): AST, ALT, ALKPHOS, BILITOT, PROT, ALBUMIN in the last 168 hours.  No results for input(s): LIPASE, AMYLASE in the last 168 hours.  No results for input(s): AMMONIA in the last 168 hours.  Coagulation Profile: No results for input(s): INR, PROTIME in the last 168 hours.  Cardiac Enzymes: No results for input(s): CKTOTAL, CKMB, CKMBINDEX, TROPONINI in the last 168 hours. BNP (last 3 results) No results for input(s): PROBNP in the last 8760 hours. HbA1C: No results for input(s): HGBA1C in the last 72 hours. CBG: Recent Labs  Lab 06/18/21 1614 06/18/21 1752 06/18/21 2133 06/19/21 0806 06/19/21 1157  GLUCAP 82 86 114* 87 129*   Lipid Profile: No results for input(s): CHOL, HDL, LDLCALC, TRIG, CHOLHDL, LDLDIRECT in the last 72 hours. Thyroid Function Tests: No results for input(s): TSH, T4TOTAL, FREET4, T3FREE, THYROIDAB in the last 72 hours. Anemia Panel: No results for input(s): VITAMINB12, FOLATE, FERRITIN, TIBC, IRON, RETICCTPCT in the last 72 hours. Sepsis Labs: No results for input(s): PROCALCITON, LATICACIDVEN in the last 168 hours.   Recent Results (from the past 240 hour(s))  MRSA Next Gen by PCR, Nasal     Status: Abnormal   Collection Time: 06/10/21  8:46 AM   Specimen: Nasal Mucosa; Nasal Swab  Result Value Ref Range Status   MRSA by PCR Next Gen DETECTED (A) NOT DETECTED Final    Comment: RESULT CALLED TO, READ BACK BY AND VERIFIED WITH: Leonie Man, RN 1036 06/10/21 KDS (NOTE) The GeneXpert MRSA Assay (FDA approved for NASAL specimens only), is one component of a comprehensive MRSA colonization surveillance program. It is not intended to diagnose MRSA infection nor to guide or monitor treatment for MRSA infections. Test performance is not FDA approved in patients less than 95 years old. Performed at [facility], [place] [address], [place],   [address]          Radiology Studies: No results found.      Scheduled Meds:  Chlorhexidine Gluconate Cloth  6 each Topical Daily   insulin aspart  0-5 Units Subcutaneous QHS   insulin aspart  0-9 Units Subcutaneous TID WC   insulin aspart  2 Units Subcutaneous TID WC   insulin detemir  5 Units Subcutaneous Daily   mouth rinse  15 mL Mouth Rinse q12n4p   metoprolol succinate  25 mg Oral Daily   pantoprazole  40 mg Oral BID   simvastatin  20 mg Oral QHS   Continuous Infusions:  sodium chloride Stopped (06/10/21 1106)     LOS: 12 days    Time spent: 35  minutes spent on chart review, discussion with nursing staff, consultants, updating family and interview/physical exam; more than 50% of that time was spent in counseling and/or coordination of care.    Moishe Schellenberg J [place] DO [facility] Available via Epic secure chat 7am-7pm After these hours, please refer to coverage provider listed on [email or web address] 06/19/2021, 1:04 PM

## 2021-06-19 NOTE — Plan of Care (Signed)
  Problem: Education: Goal: Knowledge of General Education information will improve Description: Including pain rating scale, medication(s)/side effects and non-pharmacologic comfort measures Outcome: Adequate for Discharge   Problem: Clinical Measurements: Goal: Will remain free from infection Outcome: Adequate for Discharge   Problem: Clinical Measurements: Goal: Respiratory complications will improve Outcome: Adequate for Discharge

## 2021-06-19 NOTE — TOC Transition Note (Signed)
Transition of Care [facility]) - CM/SW Discharge Note   Patient Details  Name: Dejean Dostal. MRN: [id number] Date of Birth: [birth year]  Transition of Care [facility]) CM/SW Contact:  Ross Ludwig, LCSW Phone Number: 06/19/2021, 4:56 PM   Clinical Narrative:     CSW was informed by [facility] that patient's insurance denied SNF placement.  Insurance company offered a peer to peer, [place] notified attending physician, patient, and family.  Patient and family have decided they would just rather go home with home health.  Patient is currently open to [facility], CSW spoke to [facility] and they can accept patient.  CSW informed them that patient is discharging today.  Patient will be going home with home health through [facility].  CSW signing off please reconsult with any other social work needs, home health agency has been notified of planned discharge.    Final next level of care: [place] Barriers to Discharge: Barriers Resolved   Patient Goals and CMS Choice Patient states their goals for this hospitalization and ongoing recovery are:: To return back home with home health services. CMS Medicare.gov Compare Post Acute Care list provided to:: Patient Represenative (must comment) Choice offered to / list presented to : Adult Children, Patient  Discharge Placement PASRR number recieved: 06/15/21                     Discharge Plan and Services   Discharge Planning Services: CM Consult Post Acute Care Choice: Skilled Nursing Facility                    HH Arranged: PT, OT, Nurse's Aide, RN, Social Work [facility] Agency: [place] Date [place]: 06/19/21 Time [place]: [place] Representative spoke with at [place]: [place] ([place]) Interventions     Readmission Risk Interventions No flowsheet data found.

## 2021-06-19 NOTE — Consult Note (Signed)
[facility] CM Inpatient Consult   06/19/2021  [name] 08/10/1941 [place]:[id number]  [place] [facility] [[facility]] Patient: [name] Medicare   Patient chart has been reviewed for unplanned readmissions less than 30 days. Patient assessed for community [place] [facility] follow up needs. Per review, current disposition plans are for SNF. No [facility] CM needs at this time.  Netta Cedars, MSN, [facility] Liaison Nurse Mobile Phone [phone]  Toll free office [phone]

## 2021-06-19 NOTE — Progress Notes (Signed)
Physical Therapy Treatment Patient Details Name: [name]. MRN: [id number] DOB: [birth year] Today's Date: 06/19/2021    History of Present Illness 80 y.o. male admitted 06/08/21 with progressive weakness failure to thrive at home and generalized malaise since his surgery.  Per family he has been declining with poor oral intake.  Patient was noted to be dehydrated. Dx of SIRS. Pt  with medical history significant of bladder cancer s/p cystectomy, prostatectomy and ileal conduit surgery on May 19, 2021, coronary artery disease, hyperlipidemia, essential hypertension, obstructive sleep apnea, Tourette's syndrome.    PT Comments    Patient progressing steadily with acute PT. He was able to ambulate 2x 60' with RW and min guard/assist to maintain safe position to walker. Pt also progressed functional exercises today and completed 2x5 reps sit<>stands without UE use for power up; did note compensation with back of legs against chair to steady balance during rise. He will benefit from ST stay at SNF for rehab to further progress independence with mobility and reduce risk of falling. Acute PT will continue to progress pt as able.     Follow Up Recommendations  SNF;Supervision/Assistance - 24 hour;Supervision for mobility/OOB (HHPT if pt is denied SNF)     Equipment Recommendations  None recommended by PT    Recommendations for Other Services       Precautions / Restrictions Precautions Precautions: Fall Precaution Comments: ileal conduit/cystectomy 05/19/21 Restrictions Weight Bearing Restrictions: No    Mobility  Bed Mobility Overal bed mobility: Needs Assistance Bed Mobility: Supine to Sit     Supine to sit: Min guard;Supervision;HOB elevated     General bed mobility comments: extra time and pt using bed rail.    Transfers Overall transfer level: Needs assistance Equipment used: Rolling walker (2 wheeled) Transfers: Sit to/from Stand Sit to Stand: From elevated  surface;Min guard         General transfer comment: guarding for safety. pt required bed slightly elevated and use of bil UE's for power up. repeat sit<>stands performed from recliner for exercise.  Ambulation/Gait Ambulation/Gait assistance: Min guard;Min assist Gait Distance (Feet): 120 Feet (60,60) Assistive device: Rolling walker (2 wheeled) Gait Pattern/deviations: Step-through pattern;Decreased stride length;Shuffle Gait velocity: decr   General Gait Details: pt required occasional min assist to maintain safe position of RW and cues for upright posture. standing rest break provided halfway.   Stairs             Wheelchair Mobility    Modified Rankin (Stroke Patients Only)       Balance Overall balance assessment: Needs assistance Sitting-balance support: Feet supported;No upper extremity supported Sitting balance-Leahy Scale: Fair     Standing balance support: During functional activity;Bilateral upper extremity supported Standing balance-Leahy Scale: Poor                              Cognition Arousal/Alertness: Awake/alert Behavior During Therapy: WFL for tasks assessed/performed Overall Cognitive Status: Impaired/Different from baseline Area of Impairment: Following commands;Problem solving                       Following Commands: Follows one step commands consistently;Follows one step commands with increased time;Follows multi-step commands inconsistently     Problem Solving: Requires verbal cues General Comments: pt concerned with/fixated on insurance options and frunstration with waiting on authorization for SNF placement.      Exercises Other Exercises Other Exercises: 2x 5 reps Sit<>Stand without  UE use fror power up or lowering. pt using back of legs to steady balance with rise.    General Comments        Pertinent Vitals/Pain Pain Assessment: No/denies pain    Home Living                      Prior  Function            PT Goals (current goals can now be found in the care plan section) Acute Rehab PT Goals Patient Stated Goal: to be able to go home PT Goal Formulation: With patient Time For Goal Achievement: 06/23/21 Potential to Achieve Goals: Good Progress towards PT goals: Progressing toward goals    Frequency    Min 2X/week      PT Plan Current plan remains appropriate    Co-evaluation              AM-PAC PT "6 Clicks" Mobility   Outcome Measure  Help needed turning from your back to your side while in a flat bed without using bedrails?: A Little Help needed moving from lying on your back to sitting on the side of a flat bed without using bedrails?: A Little Help needed moving to and from a bed to a chair (including a wheelchair)?: A Little Help needed standing up from a chair using your arms (e.g., wheelchair or bedside chair)?: A Little Help needed to walk in hospital room?: A Little Help needed climbing 3-5 steps with a railing? : A Lot 6 Click Score: 17    End of Session Equipment Utilized During Treatment: Gait belt Activity Tolerance: Patient tolerated treatment well Patient left: with family/visitor present;with call bell/phone within reach;in chair Nurse Communication: Mobility status PT Visit Diagnosis: Difficulty in walking, not elsewhere classified (R26.2);Adult, failure to thrive (R62.7);Other abnormalities of gait and mobility (R26.89)     Time: [id number] PT Time Calculation (min) (ACUTE ONLY): 23 min  Charges:  $Gait Training: 8-22 mins $Therapeutic Activity: 8-22 mins                     [name], DPT Acute Rehabilitation Services Office [phone] Pager [phone]    [name] 06/19/2021, 11:15 AM

## 2021-06-19 NOTE — Care Management Important Message (Signed)
Important Message  Patient Details IM Letter given to the Patient. Name: [name]. MRN: [id number] Date of Birth: 08/30/1941   Medicare Important Message Given:  Yes     Kerin Salen 06/19/2021, 12:54 PM

## 2021-06-19 NOTE — Progress Notes (Signed)
Nutrition Follow-up  DOCUMENTATION CODES:   Non-severe (moderate) malnutrition in context of chronic illness  INTERVENTION:   -Continue Hormel Shake BID and Magic Cup BID.  NUTRITION DIAGNOSIS:   Moderate Malnutrition related to chronic illness, cancer and cancer related treatments as evidenced by mild fat depletion, moderate fat depletion, mild muscle depletion, moderate muscle depletion.  Ongoing.  GOAL:   Patient will meet greater than or equal to 90% of their needs  Progressing.  MONITOR:   PO intake, Supplement acceptance, Labs, Weight trends, Skin, I & O's  ASSESSMENT:   80 yo male with a PMH of bladder cancer s/p cystectomy, prostatectomy and ileal conduit surgery on July 22, coronary artery disease, hyperlipidemia, essential hypertension, obstructive sleep apnea, Tourette's syndrome among other things who was brought in today secondary to progressive weakness failure to thrive at home and generalized malaise since his surgery.  Per family he has been declining with poor oral intake.  Patient was noted to be dehydrated.  Received fluid resuscitation in the ER.  Still lethargic and weak.  Urinalysis not impressive for a UTI but he meets SIRS criteria.  8/10: admitted 8/13: intubated, s/p EGD 8/14: extubated  Patient currently consuming 60-80% of meals.  Awaiting bed placement at SNF. Will continue current supplements.  Admission weight: 194 lbs. Current weight: 169 lbs Weight is -25 lbs since admission.   I/Os: -629 ml since admit UOP: 2950 ml x 24 hrs  Medications reviewed.  Labs reviewed: CBGs: 82-118  Diet Order:   Diet Order             Diet Heart Room service appropriate? Yes; Fluid consistency: Thin  Diet effective now                   EDUCATION NEEDS:   Education needs have been addressed  Skin:  Skin Assessment: Skin Integrity Issues: Skin Integrity Issues:: Incisions Incisions: Abdomen, closed  Last BM:  8/15  Height:   Ht  Readings from Last 1 Encounters:  06/07/21 '5\' 10"'$  (1.778 m)    Weight:   Wt Readings from Last 1 Encounters:  06/19/21 76.7 kg    BMI:  Body mass index is 24.26 kg/m.  Estimated Nutritional Needs:   Kcal:  1900-2100 kcal  Protein:  100-115 grams  Fluid:  >/= 1.7 L/day   Clayton Bibles, MS, RD, LDN Inpatient Clinical Dietitian Contact information available via Amion

## 2021-06-19 NOTE — Plan of Care (Signed)
  Problem: Activity: Goal: Risk for activity intolerance will decrease Outcome: Progressing   Problem: Nutrition: Goal: Adequate nutrition will be maintained Outcome: Progressing   Problem: Pain Managment: Goal: General experience of comfort will improve Outcome: Progressing   

## 2021-06-19 NOTE — TOC Progression Note (Addendum)
Transition of Care (TOC) - Progression Note    Patient Details  Name: [name]. MRN: [id number] Date of Birth: 1941/02/15  Transition of Care [facility]) CM/SW Contact  Ross Ludwig, [place] Phone Number: 06/19/2021, 11:04 AM  Clinical Narrative:     CSW contacted [facility] and spoke to [place], she is still waiting for authorization.  CSW spoke to patient's son [name] [phone] to update him that patient's authorization is still pending.  CSW explained to him that this is normal for the insurance to take this long.   Expected Discharge Plan: Skilled Nursing Facility Barriers to Discharge: SNF Pending bed offer  Expected Discharge Plan and Services Expected Discharge Plan: [place]   Discharge Planning Services: CM Consult Post Acute Care Choice: [place] Living arrangements for the past 2 months: Single Family Home Expected Discharge Date:  (unknown)                                     Social Determinants of Health (SDOH) Interventions    Readmission Risk Interventions No flowsheet data found.

## 2021-06-19 NOTE — Discharge Summary (Signed)
Physician Discharge Summary  [name]. [id number] DOB: 1941/01/26 DOA: 06/07/2021  PCP: Celene Squibb, MD  Admit date: 06/07/2021 Discharge date: 06/19/2021  Admitted From: Home Disposition: Home  Recommendations for Outpatient Follow-up:  Follow up with PCP in 1-2 weeks Follow-up with urology, Dr. Tresa Moore in 3 weeks Follow-up with gastroenterology, Dr. Henrene Pastor in 6 weeks Continue Protonix 40 mg p.o. twice daily x2 months followed by once daily dosing Discontinue lisinopril secondary to borderline hypotension Please obtain BMP/CBC in one week  Home Health: PT/OT/RN/aide/social work Equipment/Devices: None, patient/family states has all the equipment needed at home  Discharge Condition: Stable CODE STATUS: Full code Diet recommendation: Heart healthy/consistent carbohydrate diet  History of present illness:  Alex Gervacio. is an 80 year old male with past medical history significant for bladder cancer s/p cystectomy/prostatectomy with ileal conduit and stent placement on 05/19/2021, CAD, essential hypertension, OSA, Tourette syndrome who presented to [facility] ED on 8/10 with progressive weakness, cloudy urine with inability to ambulate and adult failure to thrive.   In the ED, temperature 98.2 F, BP 97/85, HR 114, RR 21, SPO2 93% on room air.  WBC count 15.1, hemoglobin 11.1, platelets 331.  BUN 36, otherwise BMP within normal limits.  Lactic acid 2.5.  CT head without contrast with no acute intracranial findings.  CT abdomen/pelvis with interval prostatectomy/cystectomy with right lower quadrant urostomy in place; mild right and moderate left hydronephrosis with stents in place with infrarenal abdominal aortic aneurysm 3.7 cm.  Urology was consulted for evaluation.  TRH consulted for further evaluation management of progressive weakness, adult failure to thrive, and sepsis secondary to UTI.  Hospital course:  Sepsis, POA Enterococcus UTI Patient presenting to ED with  progressive weakness and was found to have elevated heart rate, low blood pressure, elevated white count of 15.1.  Urine culture with Enterococcus.  Initially started on ceftriaxone which was broadened to cefepime; and subsequently de-escalated to Bactrim in accordance with culture susceptibilities.  Patient completed 7-day course of total antibiotics..   Acute respiratory failure requiring intubation due to encephalopathy: Resolved. Patient developed significant confusion on 06/10/2021 and was subsequently intubated due to respiratory compromise and inability to protect airway.  Patient was successfully extubated on 06/11/2021.  Likely complicated by hemorrhagic shock as below.  Now oxygenating well on room air.   Hemorrhagic shock 2/2 gastric ulcer During hospitalization, patient developed confusion, altered mental status and was found to have drop in hemoglobin.  Patient was transfused total 4 units PRBCs during hospitalization.  Gastroenterology was consulted and underwent EGD showing an active bleeding ulcer which was endoscopically cauterized.  Patient did require pressors temporarily which were weaned off.  H. pylori negative.  Continue Protonix 40 mg p.o. twice daily x2 months followed by once daily dosing.  Hemoglobin stable, 9.4 at time of discharge.  Recommend CBC 1 week and next specialist/PCP visit.   Type 2 diabetes mellitus Hemoglobin A1c 6.2, well controlled.  Diet controlled at home.  Outpatient follow-up with PCP.   Essential hypertension BP 134/86 this morning, continue home metoprolol succinate 25 mg p.o. daily.  Discontinued home lisinopril 2.5 mg p.o. daily for borderline hypotension.  Outpatient follow-up with PCP.   OSA: Continue nocturnal CPAP    CAD Continue simvastatin 20 mg p.o. nightly and can resume home aspirin on discharge.   Tourette's syndrome Supportive care   Hx of bladder cancer Patient recently underwent cystectomy/prostatectomy with ileal conduit and stent  placement on 05/19/2021, Urology; Dr. Tresa Moore.  Outpatient follow-up  with urology, Dr. Bess Harvest 3 weeks after discharge.   Weakness/deconditioning/debility/gait disturbance: Patient initially presented to the ED with progressive adult failure to thrive, increasing weakness, malaise following recent surgery as above.  Patient's been evaluated by PT/OT with recommendations of SNF for further rehabilitation.  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  Patient was planned to discharge to [facility] in [place] for further rehabilitation, although was denied by his insurance company.  Given his progress during hospitalization with inpatient therapy, patient now desires to return home with home health.  Home health with PT/OT/RN/aide and social work was arranged.  Patient denied any need for any equipment as they have sufficient at their home.   Discharge Diagnoses:  Active Problems:   OSA (obstructive sleep apnea)   Bladder cancer (HCC)   Mixed hyperlipidemia   Essential hypertension   Type 2 diabetes mellitus (HCC)   Malnutrition of moderate degree   Duodenal ulcer   Acute duodenal ulcer with bleeding   Acute blood loss anemia    Discharge Instructions  Discharge Instructions     Call MD for:  difficulty breathing, headache or visual disturbances   Complete by: As directed    Call MD for:  extreme fatigue   Complete by: As directed    Call MD for:  persistant dizziness or light-headedness   Complete by: As directed    Call MD for:  persistant nausea and vomiting   Complete by: As directed    Call MD for:  severe uncontrolled pain   Complete by: As directed    Call MD for:  temperature >100.4   Complete by: As directed    Diet - low sodium heart healthy   Complete by: As directed    Increase activity slowly   Complete by: As directed    No wound care   Complete by: As directed       Allergies as of 06/19/2021    No Known Allergies      Medication List     STOP taking these medications    lisinopril 2.5 MG tablet Commonly known as: ZESTRIL       TAKE these medications    acetaminophen 500 MG tablet Commonly known as: TYLENOL Take 1,000 mg by mouth every 6 (six) hours as needed for mild pain.   aspirin 81 MG EC tablet Take 81 mg by mouth daily. Swallow whole.   cetirizine 10 MG tablet Commonly known as: ZYRTEC Take 10 mg by mouth at bedtime.   CoQ-10 100 MG Caps Take 100 mg by mouth 2 (two) times daily.   guanFACINE 1 MG tablet Commonly known as: TENEX Take 2 tablets (2 mg total) by mouth at bedtime.   metoprolol succinate 25 MG 24 hr tablet Commonly known as: TOPROL-XL Take 25 mg by mouth in the morning and at bedtime.   Multi For Him 50+ Tabs Take 1 tablet by mouth daily.   pantoprazole 40 MG tablet Commonly known as: PROTONIX Take 1 tablet (40 mg total) by mouth 2 (two) times daily for 60 days, THEN 1 tablet (40 mg total) 2 (two) times daily. Start taking on: June 19, 2021   simvastatin 20 MG tablet Commonly known as: ZOCOR Take 20 mg by mouth at bedtime.   traMADol 50 MG tablet Commonly known as: Ultram Take 1 - 2 tablets by mouth every 6 hours as needed for moderate to severe pain (post-operatively).  vitamin C 500 MG tablet Commonly known as: ASCORBIC ACID Take 500 mg by mouth daily.        Follow-up Information     Alexis Frock, MD Follow up.   Specialty: Urology Why: Office will call to arrange office visit in abotu 3 weeks. Contact information: [place] [address] [phone]         Irene Shipper, MD. Schedule an appointment as soon as possible for a visit in 6 week(s).   Specialty: Gastroenterology Contact information: [address] [place] [address] [phone]         Hall, John Z, MD. Schedule an appointment as soon as possible for a visit in 1 week(s).   Specialty: Internal Medicine Contact  information: [place] [address] [phone]                No Known Allergies  Consultations: PCCM Urology GI -  Dr. Benson Norway, Dr. Henrene Pastor   Procedures/Studies: DG Chest 1 View  Result Date: 06/09/2021 CLINICAL DATA:  Tachycardia. Status post cystectomy 7/22. Failure to thrive. EXAM: CHEST  1 VIEW COMPARISON:  One-view chest x-ray 06/07/2021 FINDINGS: Heart size normal. Atherosclerotic changes are present at the aortic arch. Lung volumes are low. No edema or effusion is present. No focal airspace disease is present. IMPRESSION: 1. Low lung volumes. 2. No acute cardiopulmonary disease. Electronically Signed   By: San Morelle M.D.   On: 06/09/2021 14:10   DG Abd 1 View  Result Date: 06/10/2021 CLINICAL DATA:  Orogastric tube placement EXAM: ABDOMEN - 1 VIEW COMPARISON:  None. FINDINGS: Gastric tube extends into the decompressed stomach. Bilateral nephroureteral catheters are noted. Visualized bowel gas pattern unremarkable. Multilevel spondylitic changes in the lumbar spine. IMPRESSION: Gastric tube into decompressed stomach. Electronically Signed   By: Lucrezia Europe M.D.   On: 06/10/2021 12:22   CT HEAD WO CONTRAST (5MM)  Result Date: 06/07/2021 CLINICAL DATA:  Mental status change, unknown cause EXAM: CT HEAD WITHOUT CONTRAST TECHNIQUE: Contiguous axial images were obtained from the base of the skull through the vertex without intravenous contrast. COMPARISON:  None. FINDINGS: Brain: No acute intracranial abnormality. Specifically, no hemorrhage, hydrocephalus, mass lesion, acute infarction, or significant intracranial injury. Mild age related volume loss. Vascular: No hyperdense vessel or unexpected calcification. Skull: No acute calvarial abnormality. Sinuses/Orbits: No acute findings Other: None IMPRESSION: No acute intracranial abnormality. Electronically Signed   By: Rolm Baptise M.D.   On: 06/07/2021 19:33   CT ABDOMEN PELVIS W CONTRAST  Result Date:  06/07/2021 CLINICAL DATA:  Recent bladder surgery weakness EXAM: CT ABDOMEN AND PELVIS WITH CONTRAST TECHNIQUE: Multidetector CT imaging of the abdomen and pelvis was performed using the standard protocol following bolus administration of intravenous contrast. CONTRAST:  86m OMNIPAQUE IOHEXOL 350 MG/ML SOLN COMPARISON:  CT 02/17/2021, 01/03/2021 FINDINGS: Lower chest: Lung bases demonstrate no acute consolidation or effusion. Normal cardiac size. Hepatobiliary: No focal liver abnormality is seen. No gallstones, gallbladder wall thickening, or biliary dilatation. Pancreas: Unremarkable. No pancreatic ductal dilatation or surrounding inflammatory changes. Spleen: Normal in size without focal abnormality. Adrenals/Urinary Tract: Adrenal glands are normal. Lower pole right renal sinus cyst. Multiple bilateral kidney stones. Interval cystectomy and right lower quadrant urostomy. Placement of bilateral ureteral stents. Interim development of mild right and moderate left hydronephrosis. Stomach/Bowel: The stomach is nonenlarged. No dilated small bowel. No acute bowel wall thickening. Vascular/Lymphatic: Advanced aortic atherosclerosis. 3.7 cm infrarenal abdominal aortic aneurysm. No suspicious nodes Reproductive:  Status post prostatectomy Other: Small pelvic effusion. No free air. Moderate gas in the subcutaneous soft tissues Musculoskeletal: No acute osseous abnormality. Degenerative changes of the spine. IMPRESSION: 1. Interval prostatectomy and cystectomy and right lower quadrant urostomy. Interim development of mild right and moderate left hydronephrosis with ureteral stents in place. Correlate for stent function. 2. Small fluid in the pelvis. Moderate gas in the subcutaneous soft tissues of the abdominal wall presumably due to postsurgical gas. 3. Infrarenal abdominal aortic aneurysm up to 3.7 cm. Recommend follow-up ultrasound every 2 years. This recommendation follows ACR consensus guidelines: White Paper of the  ACR Incidental Findings Committee II on Vascular Findings. J Am Coll Radiol 2013; 10:789-794. Electronically Signed   By: Donavan Foil M.D.   On: 06/07/2021 19:48   US RENAL  Result Date: 06/08/2021 CLINICAL DATA:  Elevated serum creatinine EXAM: RENAL / URINARY TRACT ULTRASOUND COMPLETE COMPARISON:  None. FINDINGS: Right Kidney: Renal measurements: 12.1 x 6.1 x 5.1 cm = volume: 197 mL. No hydronephrosis. There is a 5.4 x 3.9 x 3.1 cm cystic lesion in the lower pole of the right kidney. This appears simple. No nephrolithiasis visualized. Left Kidney: Renal measurements: 13.0 x 5.7 x 5.6 cm = volume: 215 mL. Moderate hydronephrosis. No cystic or solid lesion. No nephrolithiasis visualized. Bladder: Prior cystectomy Other: None. IMPRESSION: Moderate left-sided hydronephrosis. No right-sided hydronephrosis. 5.4 cm simple appearing right lower pole renal cyst. Electronically Signed   By: Maurine Simmering M.D.   On: 06/08/2021 13:49   DG CHEST PORT 1 VIEW  Result Date: 06/10/2021 CLINICAL DATA:  ETT and central line placement EXAM: PORTABLE CHEST - 1 VIEW COMPARISON:  the previous day's study FINDINGS: Endotracheal tube tip approximately 5.2 cm above carina. Nasogastric tube into the decompressed stomach. Left IJ central line is directed towards the lateral wall of the proximal SVC. No pneumothorax. Lungs are clear. Heart size and mediastinal contours are within normal limits. Aortic Atherosclerosis (ICD10-170.0). No effusion. Visualized bones unremarkable. IMPRESSION: 1. Support hardware placement as above.  No pneumothorax. Electronically Signed   By: Lucrezia Europe M.D.   On: 06/10/2021 12:21   DG Chest Port 1 View  Result Date: 06/07/2021 CLINICAL DATA:  Chest pain, cough. EXAM: PORTABLE CHEST 1 VIEW COMPARISON:  None. FINDINGS: The heart size and mediastinal contours are within normal limits. Both lungs are clear. The visualized skeletal structures are unremarkable. IMPRESSION: No active disease. Aortic  Atherosclerosis (ICD10-I70.0). Electronically Signed   By: Marijo Conception M.D.   On: 06/07/2021 18:36   ECHOCARDIOGRAM COMPLETE  Result Date: 06/11/2021    ECHOCARDIOGRAM REPORT   Patient Name:   Alex Shontz. Date of Exam: 06/11/2021 Medical Rec #:  [id number]           Height:       70.0 in Accession #:    [id number]          Weight:       182.8 lb Date of Birth:  25-Feb-1941           BSA:          2.009 m Patient Age:    32 years            BP:           135/82 mmHg Patient Gender: M                   HR:           100 bpm. Exam  Location:  Inpatient Procedure: 2D Echo, Color Doppler, Cardiac Doppler and Intracardiac            Opacification Agent Indications:    Cardiac arrest I46.9  History:        Patient has no prior history of Echocardiogram examinations.                 CAD, Arrythmias:First Degree AV block and RBBB; Risk                 Factors:Hypertension, Dyslipidemia and Sleep Apnea.  Sonographer:    Darlina Sicilian RDCS Referring Phys: [place]  1. Left ventricular ejection fraction, by estimation, is 55 to 60%. The left ventricle has normal function. The left ventricle has no regional wall motion abnormalities. Left ventricular diastolic function could not be evaluated.  2. Right ventricular systolic function is normal. The right ventricular size is not well visualized.  3. The mitral valve was not well visualized. Trivial mitral valve regurgitation. Moderate mitral annular calcification.  4. The aortic valve is tricuspid. There is moderate calcification of the aortic valve. Aortic valve regurgitation is not visualized. Mild to moderate aortic valve sclerosis/calcification is present, without any evidence of aortic stenosis. Comparison(s): No prior Echocardiogram. Conclusion(s)/Recommendation(s): Limited windows, only subcostal images interpretable, even with use of echo contrast. Grossly normal LV and RV function without apparent severe valve disease, but cannot  exclude mild abnormalities given limited windows. FINDINGS  Left Ventricle: Left ventricular ejection fraction, by estimation, is 55 to 60%. The left ventricle has normal function. The left ventricle has no regional wall motion abnormalities. Definity contrast agent was given IV to delineate the left ventricular  endocardial borders. The left ventricular internal cavity size was normal in size. There is no left ventricular hypertrophy. Left ventricular diastolic function could not be evaluated. Right Ventricle: The right ventricular size is not well visualized. Right vetricular wall thickness was not well visualized. Right ventricular systolic function is normal. Left Atrium: Left atrial size was not well visualized. Right Atrium: Right atrial size was not well visualized. Pericardium: There is no evidence of pericardial effusion. Mitral Valve: The mitral valve was not well visualized. Moderate mitral annular calcification. Trivial mitral valve regurgitation. Tricuspid Valve: The tricuspid valve is not well visualized. Tricuspid valve regurgitation is trivial. No evidence of tricuspid stenosis. Aortic Valve: The aortic valve is tricuspid. There is moderate calcification of the aortic valve. Aortic valve regurgitation is not visualized. Mild to moderate aortic valve sclerosis/calcification is present, without any evidence of aortic stenosis. Pulmonic Valve: The pulmonic valve was not well visualized. Pulmonic valve regurgitation is not visualized. No evidence of pulmonic stenosis. Aorta: The aortic root was not well visualized, the ascending aorta was not well visualized and the aortic arch was not well visualized. Venous: IVC assessment for right atrial pressure unable to be performed due to mechanical ventilation. IAS/Shunts: The atrial septum is grossly normal. Buford Dresser MD Electronically signed by Buford Dresser MD Signature Date/Time: 06/11/2021/2:59:45 PM    Final      Subjective: Patient  seen examined at bedside, resting comfortably.  Son present at bedside.  Denied SNF authorization by his insurance company, now patient wishes discharge home with home health.  No other questions or concerns at this time.  Denies headache, no fever/chills/night sweats, no nausea/vomiting/diarrhea, no chest pain, no palpitations, no abdominal pain, no weakness, no fatigue, no paresthesias.  No acute events overnight per nurse staff.  Discharge Exam: Vitals:   06/19/21 0910  06/19/21 1500  BP: 108/78 92/78  Pulse: 95 79  Resp: 16 17  Temp:  97.8 F (36.6 C)  SpO2: 98% 100%   Vitals:   06/19/21 0500 06/19/21 0508 06/19/21 0910 06/19/21 1500  BP:  139/79 108/78 92/78  Pulse:  68 95 79  Resp:  '20 16 17  '$ Temp:  97.6 F (36.4 C)  97.8 F (36.6 C)  TempSrc:  Oral  Oral  SpO2:  95% 98% 100%  Weight: 76.7 kg     Height:        General: Pt is alert, awake, not in acute distress Cardiovascular: RRR, S1/S2 +, no rubs, no gallops Respiratory: CTA bilaterally, no wheezing, no rhonchi Abdominal: Soft, NT, ND, bowel sounds + GU: Urostomy noted with clear yellow urine in urostomy bag and with stents noted in bag. Extremities: no edema, no cyanosis    The results of significant diagnostics from this hospitalization (including imaging, microbiology, ancillary and laboratory) are listed below for reference.     Microbiology: Recent Results (from the past 240 hour(s))  MRSA Next Gen by PCR, Nasal     Status: Abnormal   Collection Time: 06/10/21  8:46 AM   Specimen: Nasal Mucosa; Nasal Swab  Result Value Ref Range Status   MRSA by PCR Next Gen DETECTED (A) NOT DETECTED Final    Comment: RESULT CALLED TO, READ BACK BY AND VERIFIED WITH: Leonie Man, RN 1036 06/10/21 KDS (NOTE) The GeneXpert MRSA Assay (FDA approved for NASAL specimens only), is one component of a comprehensive MRSA colonization surveillance program. It is not intended to diagnose MRSA infection nor to guide or monitor  treatment for MRSA infections. Test performance is not FDA approved in patients less than 79 years old. Performed at [facility], [place] [address], [place], [place] [address]      Labs: BNP (last 3 results) Recent Labs    06/10/21 0809  BNP [id number]*   Basic Metabolic Panel: Recent Labs  Lab 06/13/21 0350 06/14/21 0514 06/15/21 0758 06/17/21 2207  NA  --   --  139 136  K  --   --  3.0* 4.4  CL  --   --  106 104  CO2  --   --  27 26  GLUCOSE  --   --  90 139*  BUN  --   --  12 15  CREATININE  --   --  0.67 1.18  CALCIUM  --   --  7.6* 8.4*  MG  --   --  1.5*  --   PHOS 2.1* 1.7* 2.3*  --    Liver Function Tests: No results for input(s): AST, ALT, ALKPHOS, BILITOT, PROT, ALBUMIN in the last 168 hours. No results for input(s): LIPASE, AMYLASE in the last 168 hours. No results for input(s): AMMONIA in the last 168 hours. CBC: Recent Labs  Lab 06/13/21 1609 06/13/21 2136 06/14/21 0514 06/14/21 1028 06/15/21 0758  WBC  --   --   --   --  11.0*  HGB 9.7* 9.0* 9.1* 9.6* 9.4*  HCT 30.3* 27.8* 28.1* 30.2* 29.1*  MCV  --   --   --   --  90.1  PLT  --   --   --   --  209   Cardiac Enzymes: No results for input(s): CKTOTAL, CKMB, CKMBINDEX, TROPONINI in the last 168 hours. BNP: Invalid input(s): POCBNP CBG: Recent Labs  Lab 06/18/21 1614 06/18/21 1752 06/18/21 2133 06/19/21 0806 06/19/21 1157  GLUCAP 82 86 114* 87 129*   D-Dimer No results for input(s): DDIMER in the last 72 hours. Hgb A1c No results for input(s): HGBA1C in the last 72 hours. Lipid Profile No results for input(s): CHOL, HDL, LDLCALC, TRIG, CHOLHDL, LDLDIRECT in the last 72 hours. Thyroid function studies No results for input(s): TSH, T4TOTAL, T3FREE, THYROIDAB in the last 72 hours.  Invalid input(s): FREET3 Anemia work up No results for input(s): VITAMINB12, FOLATE, FERRITIN, TIBC, IRON, RETICCTPCT in the last 72 hours. Urinalysis    Component Value Date/Time    COLORURINE YELLOW 06/07/2021 1734   APPEARANCEUR TURBID (A) 06/07/2021 1734   APPEARANCEUR Hazy (A) 01/05/2021 1335   LABSPEC 1.013 06/07/2021 1734   PHURINE 9.0 (H) 06/07/2021 1734   GLUCOSEU NEGATIVE 06/07/2021 1734   HGBUR SMALL (A) 06/07/2021 1734   BILIRUBINUR NEGATIVE 06/07/2021 1734   BILIRUBINUR Negative 01/05/2021 1335   KETONESUR NEGATIVE 06/07/2021 1734   PROTEINUR 100 (A) 06/07/2021 1734   NITRITE NEGATIVE 06/07/2021 1734   LEUKOCYTESUR MODERATE (A) 06/07/2021 1734   Sepsis Labs Invalid input(s): PROCALCITONIN,  WBC,  LACTICIDVEN Microbiology Recent Results (from the past 240 hour(s))  MRSA Next Gen by PCR, Nasal     Status: Abnormal   Collection Time: 06/10/21  8:46 AM   Specimen: Nasal Mucosa; Nasal Swab  Result Value Ref Range Status   MRSA by PCR Next Gen DETECTED (A) NOT DETECTED Final    Comment: RESULT CALLED TO, READ BACK BY AND VERIFIED WITH: Leonie Man, RN 1036 06/10/21 KDS (NOTE) The GeneXpert MRSA Assay (FDA approved for NASAL specimens only), is one component of a comprehensive MRSA colonization surveillance program. It is not intended to diagnose MRSA infection nor to guide or monitor treatment for MRSA infections. Test performance is not FDA approved in patients less than 72 years old. Performed at [facility], [place] [address], [place], [place] [address]      Time coordinating discharge: Over 30 minutes  SIGNED:   Chiquitta Matty J [place] DO  [facility] 06/19/2021, 4:10 PM

## 2021-06-20 DIAGNOSIS — Z483 Aftercare following surgery for neoplasm: Secondary | ICD-10-CM | POA: Diagnosis not present

## 2021-06-20 DIAGNOSIS — Z6827 Body mass index (BMI) 27.0-27.9, adult: Secondary | ICD-10-CM | POA: Diagnosis not present

## 2021-06-20 DIAGNOSIS — E669 Obesity, unspecified: Secondary | ICD-10-CM | POA: Diagnosis not present

## 2021-06-20 DIAGNOSIS — C678 Malignant neoplasm of overlapping sites of bladder: Secondary | ICD-10-CM | POA: Diagnosis not present

## 2021-06-20 DIAGNOSIS — Z9181 History of falling: Secondary | ICD-10-CM | POA: Diagnosis not present

## 2021-06-20 DIAGNOSIS — Z906 Acquired absence of other parts of urinary tract: Secondary | ICD-10-CM | POA: Diagnosis not present

## 2021-06-20 DIAGNOSIS — I25119 Atherosclerotic heart disease of native coronary artery with unspecified angina pectoris: Secondary | ICD-10-CM | POA: Diagnosis not present

## 2021-06-20 DIAGNOSIS — I10 Essential (primary) hypertension: Secondary | ICD-10-CM | POA: Diagnosis not present

## 2021-06-20 DIAGNOSIS — Z436 Encounter for attention to other artificial openings of urinary tract: Secondary | ICD-10-CM | POA: Diagnosis not present

## 2021-06-23 DIAGNOSIS — Z436 Encounter for attention to other artificial openings of urinary tract: Secondary | ICD-10-CM | POA: Diagnosis not present

## 2021-06-23 DIAGNOSIS — E669 Obesity, unspecified: Secondary | ICD-10-CM | POA: Diagnosis not present

## 2021-06-23 DIAGNOSIS — Z6827 Body mass index (BMI) 27.0-27.9, adult: Secondary | ICD-10-CM | POA: Diagnosis not present

## 2021-06-23 DIAGNOSIS — I25119 Atherosclerotic heart disease of native coronary artery with unspecified angina pectoris: Secondary | ICD-10-CM | POA: Diagnosis not present

## 2021-06-23 DIAGNOSIS — Z9181 History of falling: Secondary | ICD-10-CM | POA: Diagnosis not present

## 2021-06-23 DIAGNOSIS — Z906 Acquired absence of other parts of urinary tract: Secondary | ICD-10-CM | POA: Diagnosis not present

## 2021-06-23 DIAGNOSIS — C678 Malignant neoplasm of overlapping sites of bladder: Secondary | ICD-10-CM | POA: Diagnosis not present

## 2021-06-23 DIAGNOSIS — I10 Essential (primary) hypertension: Secondary | ICD-10-CM | POA: Diagnosis not present

## 2021-06-23 DIAGNOSIS — Z483 Aftercare following surgery for neoplasm: Secondary | ICD-10-CM | POA: Diagnosis not present

## 2021-06-26 DIAGNOSIS — Z6827 Body mass index (BMI) 27.0-27.9, adult: Secondary | ICD-10-CM | POA: Diagnosis not present

## 2021-06-26 DIAGNOSIS — Z436 Encounter for attention to other artificial openings of urinary tract: Secondary | ICD-10-CM | POA: Diagnosis not present

## 2021-06-26 DIAGNOSIS — Z483 Aftercare following surgery for neoplasm: Secondary | ICD-10-CM | POA: Diagnosis not present

## 2021-06-26 DIAGNOSIS — I25119 Atherosclerotic heart disease of native coronary artery with unspecified angina pectoris: Secondary | ICD-10-CM | POA: Diagnosis not present

## 2021-06-26 DIAGNOSIS — C678 Malignant neoplasm of overlapping sites of bladder: Secondary | ICD-10-CM | POA: Diagnosis not present

## 2021-06-26 DIAGNOSIS — E669 Obesity, unspecified: Secondary | ICD-10-CM | POA: Diagnosis not present

## 2021-06-26 DIAGNOSIS — I10 Essential (primary) hypertension: Secondary | ICD-10-CM | POA: Diagnosis not present

## 2021-06-26 DIAGNOSIS — Z906 Acquired absence of other parts of urinary tract: Secondary | ICD-10-CM | POA: Diagnosis not present

## 2021-06-26 DIAGNOSIS — Z9181 History of falling: Secondary | ICD-10-CM | POA: Diagnosis not present

## 2021-06-27 DIAGNOSIS — Z6827 Body mass index (BMI) 27.0-27.9, adult: Secondary | ICD-10-CM | POA: Diagnosis not present

## 2021-06-27 DIAGNOSIS — Z906 Acquired absence of other parts of urinary tract: Secondary | ICD-10-CM | POA: Diagnosis not present

## 2021-06-27 DIAGNOSIS — C678 Malignant neoplasm of overlapping sites of bladder: Secondary | ICD-10-CM | POA: Diagnosis not present

## 2021-06-27 DIAGNOSIS — Z483 Aftercare following surgery for neoplasm: Secondary | ICD-10-CM | POA: Diagnosis not present

## 2021-06-27 DIAGNOSIS — Z9181 History of falling: Secondary | ICD-10-CM | POA: Diagnosis not present

## 2021-06-27 DIAGNOSIS — I25119 Atherosclerotic heart disease of native coronary artery with unspecified angina pectoris: Secondary | ICD-10-CM | POA: Diagnosis not present

## 2021-06-27 DIAGNOSIS — Z436 Encounter for attention to other artificial openings of urinary tract: Secondary | ICD-10-CM | POA: Diagnosis not present

## 2021-06-27 DIAGNOSIS — I10 Essential (primary) hypertension: Secondary | ICD-10-CM | POA: Diagnosis not present

## 2021-06-27 DIAGNOSIS — E669 Obesity, unspecified: Secondary | ICD-10-CM | POA: Diagnosis not present

## 2021-06-28 DIAGNOSIS — Z483 Aftercare following surgery for neoplasm: Secondary | ICD-10-CM | POA: Diagnosis not present

## 2021-06-28 DIAGNOSIS — I10 Essential (primary) hypertension: Secondary | ICD-10-CM | POA: Diagnosis not present

## 2021-06-28 DIAGNOSIS — Z906 Acquired absence of other parts of urinary tract: Secondary | ICD-10-CM | POA: Diagnosis not present

## 2021-06-28 DIAGNOSIS — Z436 Encounter for attention to other artificial openings of urinary tract: Secondary | ICD-10-CM | POA: Diagnosis not present

## 2021-06-28 DIAGNOSIS — I25119 Atherosclerotic heart disease of native coronary artery with unspecified angina pectoris: Secondary | ICD-10-CM | POA: Diagnosis not present

## 2021-06-28 DIAGNOSIS — Z6827 Body mass index (BMI) 27.0-27.9, adult: Secondary | ICD-10-CM | POA: Diagnosis not present

## 2021-06-28 DIAGNOSIS — E669 Obesity, unspecified: Secondary | ICD-10-CM | POA: Diagnosis not present

## 2021-06-28 DIAGNOSIS — Z9181 History of falling: Secondary | ICD-10-CM | POA: Diagnosis not present

## 2021-06-28 DIAGNOSIS — C678 Malignant neoplasm of overlapping sites of bladder: Secondary | ICD-10-CM | POA: Diagnosis not present

## 2021-06-30 DIAGNOSIS — I10 Essential (primary) hypertension: Secondary | ICD-10-CM | POA: Diagnosis not present

## 2021-06-30 DIAGNOSIS — Z483 Aftercare following surgery for neoplasm: Secondary | ICD-10-CM | POA: Diagnosis not present

## 2021-06-30 DIAGNOSIS — Z436 Encounter for attention to other artificial openings of urinary tract: Secondary | ICD-10-CM | POA: Diagnosis not present

## 2021-06-30 DIAGNOSIS — E669 Obesity, unspecified: Secondary | ICD-10-CM | POA: Diagnosis not present

## 2021-06-30 DIAGNOSIS — Z906 Acquired absence of other parts of urinary tract: Secondary | ICD-10-CM | POA: Diagnosis not present

## 2021-06-30 DIAGNOSIS — C678 Malignant neoplasm of overlapping sites of bladder: Secondary | ICD-10-CM | POA: Diagnosis not present

## 2021-06-30 DIAGNOSIS — I25119 Atherosclerotic heart disease of native coronary artery with unspecified angina pectoris: Secondary | ICD-10-CM | POA: Diagnosis not present

## 2021-06-30 DIAGNOSIS — Z9181 History of falling: Secondary | ICD-10-CM | POA: Diagnosis not present

## 2021-06-30 DIAGNOSIS — Z6827 Body mass index (BMI) 27.0-27.9, adult: Secondary | ICD-10-CM | POA: Diagnosis not present

## 2021-07-04 DIAGNOSIS — Z6827 Body mass index (BMI) 27.0-27.9, adult: Secondary | ICD-10-CM | POA: Diagnosis not present

## 2021-07-04 DIAGNOSIS — I10 Essential (primary) hypertension: Secondary | ICD-10-CM | POA: Diagnosis not present

## 2021-07-04 DIAGNOSIS — K250 Acute gastric ulcer with hemorrhage: Secondary | ICD-10-CM | POA: Diagnosis not present

## 2021-07-04 DIAGNOSIS — E669 Obesity, unspecified: Secondary | ICD-10-CM | POA: Diagnosis not present

## 2021-07-04 DIAGNOSIS — Z9181 History of falling: Secondary | ICD-10-CM | POA: Diagnosis not present

## 2021-07-04 DIAGNOSIS — Z906 Acquired absence of other parts of urinary tract: Secondary | ICD-10-CM | POA: Diagnosis not present

## 2021-07-04 DIAGNOSIS — I25119 Atherosclerotic heart disease of native coronary artery with unspecified angina pectoris: Secondary | ICD-10-CM | POA: Diagnosis not present

## 2021-07-04 DIAGNOSIS — C678 Malignant neoplasm of overlapping sites of bladder: Secondary | ICD-10-CM | POA: Diagnosis not present

## 2021-07-04 DIAGNOSIS — Z483 Aftercare following surgery for neoplasm: Secondary | ICD-10-CM | POA: Diagnosis not present

## 2021-07-04 DIAGNOSIS — Z436 Encounter for attention to other artificial openings of urinary tract: Secondary | ICD-10-CM | POA: Diagnosis not present

## 2021-07-04 DIAGNOSIS — U071 COVID-19: Secondary | ICD-10-CM | POA: Diagnosis not present

## 2021-07-04 DIAGNOSIS — R627 Adult failure to thrive: Secondary | ICD-10-CM | POA: Diagnosis not present

## 2021-07-06 DIAGNOSIS — E669 Obesity, unspecified: Secondary | ICD-10-CM | POA: Diagnosis not present

## 2021-07-06 DIAGNOSIS — Z906 Acquired absence of other parts of urinary tract: Secondary | ICD-10-CM | POA: Diagnosis not present

## 2021-07-06 DIAGNOSIS — Z483 Aftercare following surgery for neoplasm: Secondary | ICD-10-CM | POA: Diagnosis not present

## 2021-07-06 DIAGNOSIS — Z436 Encounter for attention to other artificial openings of urinary tract: Secondary | ICD-10-CM | POA: Diagnosis not present

## 2021-07-06 DIAGNOSIS — C678 Malignant neoplasm of overlapping sites of bladder: Secondary | ICD-10-CM | POA: Diagnosis not present

## 2021-07-06 DIAGNOSIS — Z9181 History of falling: Secondary | ICD-10-CM | POA: Diagnosis not present

## 2021-07-06 DIAGNOSIS — Z6827 Body mass index (BMI) 27.0-27.9, adult: Secondary | ICD-10-CM | POA: Diagnosis not present

## 2021-07-06 DIAGNOSIS — I25119 Atherosclerotic heart disease of native coronary artery with unspecified angina pectoris: Secondary | ICD-10-CM | POA: Diagnosis not present

## 2021-07-06 DIAGNOSIS — I10 Essential (primary) hypertension: Secondary | ICD-10-CM | POA: Diagnosis not present

## 2021-07-07 DIAGNOSIS — E669 Obesity, unspecified: Secondary | ICD-10-CM | POA: Diagnosis not present

## 2021-07-07 DIAGNOSIS — Z9181 History of falling: Secondary | ICD-10-CM | POA: Diagnosis not present

## 2021-07-07 DIAGNOSIS — C678 Malignant neoplasm of overlapping sites of bladder: Secondary | ICD-10-CM | POA: Diagnosis not present

## 2021-07-07 DIAGNOSIS — Z906 Acquired absence of other parts of urinary tract: Secondary | ICD-10-CM | POA: Diagnosis not present

## 2021-07-07 DIAGNOSIS — I10 Essential (primary) hypertension: Secondary | ICD-10-CM | POA: Diagnosis not present

## 2021-07-07 DIAGNOSIS — Z436 Encounter for attention to other artificial openings of urinary tract: Secondary | ICD-10-CM | POA: Diagnosis not present

## 2021-07-07 DIAGNOSIS — I25119 Atherosclerotic heart disease of native coronary artery with unspecified angina pectoris: Secondary | ICD-10-CM | POA: Diagnosis not present

## 2021-07-07 DIAGNOSIS — Z483 Aftercare following surgery for neoplasm: Secondary | ICD-10-CM | POA: Diagnosis not present

## 2021-07-07 DIAGNOSIS — Z6827 Body mass index (BMI) 27.0-27.9, adult: Secondary | ICD-10-CM | POA: Diagnosis not present

## 2021-07-10 DIAGNOSIS — E669 Obesity, unspecified: Secondary | ICD-10-CM | POA: Diagnosis not present

## 2021-07-10 DIAGNOSIS — Z436 Encounter for attention to other artificial openings of urinary tract: Secondary | ICD-10-CM | POA: Diagnosis not present

## 2021-07-10 DIAGNOSIS — Z6827 Body mass index (BMI) 27.0-27.9, adult: Secondary | ICD-10-CM | POA: Diagnosis not present

## 2021-07-10 DIAGNOSIS — I25119 Atherosclerotic heart disease of native coronary artery with unspecified angina pectoris: Secondary | ICD-10-CM | POA: Diagnosis not present

## 2021-07-10 DIAGNOSIS — Z906 Acquired absence of other parts of urinary tract: Secondary | ICD-10-CM | POA: Diagnosis not present

## 2021-07-10 DIAGNOSIS — I10 Essential (primary) hypertension: Secondary | ICD-10-CM | POA: Diagnosis not present

## 2021-07-10 DIAGNOSIS — Z483 Aftercare following surgery for neoplasm: Secondary | ICD-10-CM | POA: Diagnosis not present

## 2021-07-10 DIAGNOSIS — Z9181 History of falling: Secondary | ICD-10-CM | POA: Diagnosis not present

## 2021-07-10 DIAGNOSIS — C678 Malignant neoplasm of overlapping sites of bladder: Secondary | ICD-10-CM | POA: Diagnosis not present

## 2021-07-11 DIAGNOSIS — Z9181 History of falling: Secondary | ICD-10-CM | POA: Diagnosis not present

## 2021-07-11 DIAGNOSIS — E669 Obesity, unspecified: Secondary | ICD-10-CM | POA: Diagnosis not present

## 2021-07-11 DIAGNOSIS — C678 Malignant neoplasm of overlapping sites of bladder: Secondary | ICD-10-CM | POA: Diagnosis not present

## 2021-07-11 DIAGNOSIS — I25119 Atherosclerotic heart disease of native coronary artery with unspecified angina pectoris: Secondary | ICD-10-CM | POA: Diagnosis not present

## 2021-07-11 DIAGNOSIS — I10 Essential (primary) hypertension: Secondary | ICD-10-CM | POA: Diagnosis not present

## 2021-07-11 DIAGNOSIS — Z483 Aftercare following surgery for neoplasm: Secondary | ICD-10-CM | POA: Diagnosis not present

## 2021-07-11 DIAGNOSIS — Z6827 Body mass index (BMI) 27.0-27.9, adult: Secondary | ICD-10-CM | POA: Diagnosis not present

## 2021-07-11 DIAGNOSIS — Z436 Encounter for attention to other artificial openings of urinary tract: Secondary | ICD-10-CM | POA: Diagnosis not present

## 2021-07-11 DIAGNOSIS — Z906 Acquired absence of other parts of urinary tract: Secondary | ICD-10-CM | POA: Diagnosis not present

## 2021-07-12 DIAGNOSIS — I25119 Atherosclerotic heart disease of native coronary artery with unspecified angina pectoris: Secondary | ICD-10-CM | POA: Diagnosis not present

## 2021-07-12 DIAGNOSIS — E669 Obesity, unspecified: Secondary | ICD-10-CM | POA: Diagnosis not present

## 2021-07-12 DIAGNOSIS — Z906 Acquired absence of other parts of urinary tract: Secondary | ICD-10-CM | POA: Diagnosis not present

## 2021-07-12 DIAGNOSIS — C678 Malignant neoplasm of overlapping sites of bladder: Secondary | ICD-10-CM | POA: Diagnosis not present

## 2021-07-12 DIAGNOSIS — Z6827 Body mass index (BMI) 27.0-27.9, adult: Secondary | ICD-10-CM | POA: Diagnosis not present

## 2021-07-12 DIAGNOSIS — Z436 Encounter for attention to other artificial openings of urinary tract: Secondary | ICD-10-CM | POA: Diagnosis not present

## 2021-07-12 DIAGNOSIS — Z9181 History of falling: Secondary | ICD-10-CM | POA: Diagnosis not present

## 2021-07-12 DIAGNOSIS — Z483 Aftercare following surgery for neoplasm: Secondary | ICD-10-CM | POA: Diagnosis not present

## 2021-07-12 DIAGNOSIS — I10 Essential (primary) hypertension: Secondary | ICD-10-CM | POA: Diagnosis not present

## 2021-07-14 DIAGNOSIS — Z436 Encounter for attention to other artificial openings of urinary tract: Secondary | ICD-10-CM | POA: Diagnosis not present

## 2021-07-14 DIAGNOSIS — Z9181 History of falling: Secondary | ICD-10-CM | POA: Diagnosis not present

## 2021-07-14 DIAGNOSIS — Z906 Acquired absence of other parts of urinary tract: Secondary | ICD-10-CM | POA: Diagnosis not present

## 2021-07-14 DIAGNOSIS — I25119 Atherosclerotic heart disease of native coronary artery with unspecified angina pectoris: Secondary | ICD-10-CM | POA: Diagnosis not present

## 2021-07-14 DIAGNOSIS — Z483 Aftercare following surgery for neoplasm: Secondary | ICD-10-CM | POA: Diagnosis not present

## 2021-07-14 DIAGNOSIS — C678 Malignant neoplasm of overlapping sites of bladder: Secondary | ICD-10-CM | POA: Diagnosis not present

## 2021-07-14 DIAGNOSIS — I10 Essential (primary) hypertension: Secondary | ICD-10-CM | POA: Diagnosis not present

## 2021-07-14 DIAGNOSIS — Z6827 Body mass index (BMI) 27.0-27.9, adult: Secondary | ICD-10-CM | POA: Diagnosis not present

## 2021-07-14 DIAGNOSIS — E669 Obesity, unspecified: Secondary | ICD-10-CM | POA: Diagnosis not present

## 2021-07-17 DIAGNOSIS — Z483 Aftercare following surgery for neoplasm: Secondary | ICD-10-CM | POA: Diagnosis not present

## 2021-07-17 DIAGNOSIS — Z6827 Body mass index (BMI) 27.0-27.9, adult: Secondary | ICD-10-CM | POA: Diagnosis not present

## 2021-07-17 DIAGNOSIS — Z436 Encounter for attention to other artificial openings of urinary tract: Secondary | ICD-10-CM | POA: Diagnosis not present

## 2021-07-17 DIAGNOSIS — Z906 Acquired absence of other parts of urinary tract: Secondary | ICD-10-CM | POA: Diagnosis not present

## 2021-07-17 DIAGNOSIS — I25119 Atherosclerotic heart disease of native coronary artery with unspecified angina pectoris: Secondary | ICD-10-CM | POA: Diagnosis not present

## 2021-07-17 DIAGNOSIS — I10 Essential (primary) hypertension: Secondary | ICD-10-CM | POA: Diagnosis not present

## 2021-07-17 DIAGNOSIS — E669 Obesity, unspecified: Secondary | ICD-10-CM | POA: Diagnosis not present

## 2021-07-17 DIAGNOSIS — C678 Malignant neoplasm of overlapping sites of bladder: Secondary | ICD-10-CM | POA: Diagnosis not present

## 2021-07-17 DIAGNOSIS — Z9181 History of falling: Secondary | ICD-10-CM | POA: Diagnosis not present

## 2021-07-18 DIAGNOSIS — I10 Essential (primary) hypertension: Secondary | ICD-10-CM | POA: Diagnosis not present

## 2021-07-18 DIAGNOSIS — Z436 Encounter for attention to other artificial openings of urinary tract: Secondary | ICD-10-CM | POA: Diagnosis not present

## 2021-07-18 DIAGNOSIS — Z6827 Body mass index (BMI) 27.0-27.9, adult: Secondary | ICD-10-CM | POA: Diagnosis not present

## 2021-07-18 DIAGNOSIS — Z906 Acquired absence of other parts of urinary tract: Secondary | ICD-10-CM | POA: Diagnosis not present

## 2021-07-18 DIAGNOSIS — Z483 Aftercare following surgery for neoplasm: Secondary | ICD-10-CM | POA: Diagnosis not present

## 2021-07-18 DIAGNOSIS — C678 Malignant neoplasm of overlapping sites of bladder: Secondary | ICD-10-CM | POA: Diagnosis not present

## 2021-07-18 DIAGNOSIS — I25119 Atherosclerotic heart disease of native coronary artery with unspecified angina pectoris: Secondary | ICD-10-CM | POA: Diagnosis not present

## 2021-07-18 DIAGNOSIS — Z9181 History of falling: Secondary | ICD-10-CM | POA: Diagnosis not present

## 2021-07-18 DIAGNOSIS — E669 Obesity, unspecified: Secondary | ICD-10-CM | POA: Diagnosis not present

## 2021-07-21 DIAGNOSIS — Z436 Encounter for attention to other artificial openings of urinary tract: Secondary | ICD-10-CM | POA: Diagnosis not present

## 2021-07-21 DIAGNOSIS — Z9181 History of falling: Secondary | ICD-10-CM | POA: Diagnosis not present

## 2021-07-21 DIAGNOSIS — I10 Essential (primary) hypertension: Secondary | ICD-10-CM | POA: Diagnosis not present

## 2021-07-21 DIAGNOSIS — C678 Malignant neoplasm of overlapping sites of bladder: Secondary | ICD-10-CM | POA: Diagnosis not present

## 2021-07-21 DIAGNOSIS — Z6827 Body mass index (BMI) 27.0-27.9, adult: Secondary | ICD-10-CM | POA: Diagnosis not present

## 2021-07-21 DIAGNOSIS — I25119 Atherosclerotic heart disease of native coronary artery with unspecified angina pectoris: Secondary | ICD-10-CM | POA: Diagnosis not present

## 2021-07-21 DIAGNOSIS — E669 Obesity, unspecified: Secondary | ICD-10-CM | POA: Diagnosis not present

## 2021-07-21 DIAGNOSIS — Z906 Acquired absence of other parts of urinary tract: Secondary | ICD-10-CM | POA: Diagnosis not present

## 2021-07-21 DIAGNOSIS — Z483 Aftercare following surgery for neoplasm: Secondary | ICD-10-CM | POA: Diagnosis not present

## 2021-07-24 DIAGNOSIS — I10 Essential (primary) hypertension: Secondary | ICD-10-CM | POA: Diagnosis not present

## 2021-07-24 DIAGNOSIS — Z9181 History of falling: Secondary | ICD-10-CM | POA: Diagnosis not present

## 2021-07-24 DIAGNOSIS — E669 Obesity, unspecified: Secondary | ICD-10-CM | POA: Diagnosis not present

## 2021-07-24 DIAGNOSIS — Z436 Encounter for attention to other artificial openings of urinary tract: Secondary | ICD-10-CM | POA: Diagnosis not present

## 2021-07-24 DIAGNOSIS — Z483 Aftercare following surgery for neoplasm: Secondary | ICD-10-CM | POA: Diagnosis not present

## 2021-07-24 DIAGNOSIS — Z6827 Body mass index (BMI) 27.0-27.9, adult: Secondary | ICD-10-CM | POA: Diagnosis not present

## 2021-07-24 DIAGNOSIS — C678 Malignant neoplasm of overlapping sites of bladder: Secondary | ICD-10-CM | POA: Diagnosis not present

## 2021-07-24 DIAGNOSIS — I25119 Atherosclerotic heart disease of native coronary artery with unspecified angina pectoris: Secondary | ICD-10-CM | POA: Diagnosis not present

## 2021-07-24 DIAGNOSIS — Z906 Acquired absence of other parts of urinary tract: Secondary | ICD-10-CM | POA: Diagnosis not present

## 2021-07-27 DIAGNOSIS — E1169 Type 2 diabetes mellitus with other specified complication: Secondary | ICD-10-CM | POA: Diagnosis not present

## 2021-07-27 DIAGNOSIS — E782 Mixed hyperlipidemia: Secondary | ICD-10-CM | POA: Diagnosis not present

## 2021-08-03 DIAGNOSIS — E1169 Type 2 diabetes mellitus with other specified complication: Secondary | ICD-10-CM | POA: Diagnosis not present

## 2021-08-03 DIAGNOSIS — R809 Proteinuria, unspecified: Secondary | ICD-10-CM | POA: Diagnosis not present

## 2021-08-03 DIAGNOSIS — E782 Mixed hyperlipidemia: Secondary | ICD-10-CM | POA: Diagnosis not present

## 2021-08-03 DIAGNOSIS — I10 Essential (primary) hypertension: Secondary | ICD-10-CM | POA: Diagnosis not present

## 2021-08-03 DIAGNOSIS — Z23 Encounter for immunization: Secondary | ICD-10-CM | POA: Diagnosis not present

## 2021-08-03 DIAGNOSIS — I2510 Atherosclerotic heart disease of native coronary artery without angina pectoris: Secondary | ICD-10-CM | POA: Diagnosis not present

## 2021-08-03 DIAGNOSIS — R32 Unspecified urinary incontinence: Secondary | ICD-10-CM | POA: Diagnosis not present

## 2021-08-03 DIAGNOSIS — Z0001 Encounter for general adult medical examination with abnormal findings: Secondary | ICD-10-CM | POA: Diagnosis not present

## 2021-08-03 DIAGNOSIS — N202 Calculus of kidney with calculus of ureter: Secondary | ICD-10-CM | POA: Diagnosis not present

## 2021-08-03 DIAGNOSIS — C678 Malignant neoplasm of overlapping sites of bladder: Secondary | ICD-10-CM | POA: Diagnosis not present

## 2021-08-16 DIAGNOSIS — H2511 Age-related nuclear cataract, right eye: Secondary | ICD-10-CM | POA: Diagnosis not present

## 2021-08-16 DIAGNOSIS — Z961 Presence of intraocular lens: Secondary | ICD-10-CM | POA: Diagnosis not present

## 2021-08-16 DIAGNOSIS — H353131 Nonexudative age-related macular degeneration, bilateral, early dry stage: Secondary | ICD-10-CM | POA: Diagnosis not present

## 2021-08-28 DIAGNOSIS — I10 Essential (primary) hypertension: Secondary | ICD-10-CM | POA: Diagnosis not present

## 2021-08-28 DIAGNOSIS — E785 Hyperlipidemia, unspecified: Secondary | ICD-10-CM | POA: Diagnosis not present

## 2021-09-28 DIAGNOSIS — H268 Other specified cataract: Secondary | ICD-10-CM | POA: Diagnosis not present

## 2021-09-28 DIAGNOSIS — H25811 Combined forms of age-related cataract, right eye: Secondary | ICD-10-CM | POA: Diagnosis not present

## 2021-09-28 DIAGNOSIS — H5703 Miosis: Secondary | ICD-10-CM | POA: Diagnosis not present

## 2021-11-08 ENCOUNTER — Ambulatory Visit (HOSPITAL_COMMUNITY)
Admission: RE | Admit: 2021-11-08 | Discharge: 2021-11-08 | Disposition: A | Discharge status: Home / Self Care | Payer: Medicare HMO | Source: Ambulatory Visit | Attending: Urology | Admitting: Urology

## 2021-11-08 ENCOUNTER — Other Ambulatory Visit: Payer: Self-pay

## 2021-11-08 DIAGNOSIS — C678 Malignant neoplasm of overlapping sites of bladder: Secondary | ICD-10-CM | POA: Diagnosis not present

## 2021-11-08 DIAGNOSIS — R5381 Other malaise: Secondary | ICD-10-CM | POA: Insufficient documentation

## 2021-11-08 DIAGNOSIS — R918 Other nonspecific abnormal finding of lung field: Secondary | ICD-10-CM | POA: Diagnosis not present

## 2021-11-14 DIAGNOSIS — C678 Malignant neoplasm of overlapping sites of bladder: Secondary | ICD-10-CM | POA: Diagnosis not present

## 2021-11-16 DIAGNOSIS — E279 Disorder of adrenal gland, unspecified: Secondary | ICD-10-CM | POA: Diagnosis not present

## 2021-11-16 DIAGNOSIS — N281 Cyst of kidney, acquired: Secondary | ICD-10-CM | POA: Diagnosis not present

## 2021-11-16 DIAGNOSIS — N200 Calculus of kidney: Secondary | ICD-10-CM | POA: Diagnosis not present

## 2021-11-16 DIAGNOSIS — C678 Malignant neoplasm of overlapping sites of bladder: Secondary | ICD-10-CM | POA: Diagnosis not present

## 2021-11-20 ENCOUNTER — Other Ambulatory Visit: Payer: Self-pay | Admitting: Neurology

## 2021-11-20 DIAGNOSIS — F952 Tourette's disorder: Secondary | ICD-10-CM

## 2021-11-21 DIAGNOSIS — C678 Malignant neoplasm of overlapping sites of bladder: Secondary | ICD-10-CM | POA: Diagnosis not present

## 2021-11-27 DIAGNOSIS — E1169 Type 2 diabetes mellitus with other specified complication: Secondary | ICD-10-CM | POA: Diagnosis not present

## 2021-11-27 DIAGNOSIS — I10 Essential (primary) hypertension: Secondary | ICD-10-CM | POA: Diagnosis not present

## 2021-12-04 DIAGNOSIS — E1169 Type 2 diabetes mellitus with other specified complication: Secondary | ICD-10-CM | POA: Diagnosis not present

## 2021-12-04 DIAGNOSIS — I10 Essential (primary) hypertension: Secondary | ICD-10-CM | POA: Diagnosis not present

## 2021-12-04 DIAGNOSIS — G4733 Obstructive sleep apnea (adult) (pediatric): Secondary | ICD-10-CM | POA: Diagnosis not present

## 2021-12-04 DIAGNOSIS — N202 Calculus of kidney with calculus of ureter: Secondary | ICD-10-CM | POA: Diagnosis not present

## 2021-12-04 DIAGNOSIS — R809 Proteinuria, unspecified: Secondary | ICD-10-CM | POA: Diagnosis not present

## 2021-12-04 DIAGNOSIS — E669 Obesity, unspecified: Secondary | ICD-10-CM | POA: Diagnosis not present

## 2021-12-04 DIAGNOSIS — E782 Mixed hyperlipidemia: Secondary | ICD-10-CM | POA: Diagnosis not present

## 2021-12-04 DIAGNOSIS — R32 Unspecified urinary incontinence: Secondary | ICD-10-CM | POA: Diagnosis not present

## 2021-12-04 DIAGNOSIS — I2510 Atherosclerotic heart disease of native coronary artery without angina pectoris: Secondary | ICD-10-CM | POA: Diagnosis not present

## 2021-12-04 DIAGNOSIS — C678 Malignant neoplasm of overlapping sites of bladder: Secondary | ICD-10-CM | POA: Diagnosis not present

## 2022-04-04 DIAGNOSIS — E1169 Type 2 diabetes mellitus with other specified complication: Secondary | ICD-10-CM | POA: Diagnosis not present

## 2022-04-04 DIAGNOSIS — E782 Mixed hyperlipidemia: Secondary | ICD-10-CM | POA: Diagnosis not present

## 2022-04-06 DIAGNOSIS — C678 Malignant neoplasm of overlapping sites of bladder: Secondary | ICD-10-CM | POA: Diagnosis not present

## 2022-04-06 DIAGNOSIS — I2510 Atherosclerotic heart disease of native coronary artery without angina pectoris: Secondary | ICD-10-CM | POA: Diagnosis not present

## 2022-04-06 DIAGNOSIS — E663 Overweight: Secondary | ICD-10-CM | POA: Diagnosis not present

## 2022-04-06 DIAGNOSIS — R809 Proteinuria, unspecified: Secondary | ICD-10-CM | POA: Diagnosis not present

## 2022-04-06 DIAGNOSIS — G4733 Obstructive sleep apnea (adult) (pediatric): Secondary | ICD-10-CM | POA: Diagnosis not present

## 2022-04-06 DIAGNOSIS — I10 Essential (primary) hypertension: Secondary | ICD-10-CM | POA: Diagnosis not present

## 2022-04-06 DIAGNOSIS — E782 Mixed hyperlipidemia: Secondary | ICD-10-CM | POA: Diagnosis not present

## 2022-04-06 DIAGNOSIS — F952 Tourette's disorder: Secondary | ICD-10-CM | POA: Diagnosis not present

## 2022-04-06 DIAGNOSIS — N202 Calculus of kidney with calculus of ureter: Secondary | ICD-10-CM | POA: Diagnosis not present

## 2022-04-06 DIAGNOSIS — R32 Unspecified urinary incontinence: Secondary | ICD-10-CM | POA: Diagnosis not present

## 2022-04-06 DIAGNOSIS — E1169 Type 2 diabetes mellitus with other specified complication: Secondary | ICD-10-CM | POA: Diagnosis not present

## 2022-04-06 DIAGNOSIS — E669 Obesity, unspecified: Secondary | ICD-10-CM | POA: Diagnosis not present

## 2022-05-11 ENCOUNTER — Other Ambulatory Visit (HOSPITAL_COMMUNITY): Payer: Self-pay | Admitting: Urology

## 2022-05-11 ENCOUNTER — Ambulatory Visit (HOSPITAL_COMMUNITY)
Admission: RE | Admit: 2022-05-11 | Discharge: 2022-05-11 | Disposition: A | Discharge status: Home / Self Care | Payer: Medicare HMO | Source: Ambulatory Visit | Attending: Urology | Admitting: Urology

## 2022-05-11 ENCOUNTER — Ambulatory Visit (HOSPITAL_COMMUNITY): Admission: RE | Admit: 2022-05-11 | Payer: Medicare HMO | Source: Ambulatory Visit

## 2022-05-11 DIAGNOSIS — C678 Malignant neoplasm of overlapping sites of bladder: Secondary | ICD-10-CM

## 2022-05-22 DIAGNOSIS — C678 Malignant neoplasm of overlapping sites of bladder: Secondary | ICD-10-CM | POA: Diagnosis not present

## 2022-05-28 DIAGNOSIS — I10 Essential (primary) hypertension: Secondary | ICD-10-CM | POA: Diagnosis not present

## 2022-05-28 DIAGNOSIS — E1169 Type 2 diabetes mellitus with other specified complication: Secondary | ICD-10-CM | POA: Diagnosis not present

## 2022-05-28 DIAGNOSIS — C678 Malignant neoplasm of overlapping sites of bladder: Secondary | ICD-10-CM | POA: Diagnosis not present

## 2022-05-28 DIAGNOSIS — N200 Calculus of kidney: Secondary | ICD-10-CM | POA: Diagnosis not present

## 2022-05-28 DIAGNOSIS — N289 Disorder of kidney and ureter, unspecified: Secondary | ICD-10-CM | POA: Diagnosis not present

## 2022-05-28 DIAGNOSIS — E782 Mixed hyperlipidemia: Secondary | ICD-10-CM | POA: Diagnosis not present

## 2022-05-28 DIAGNOSIS — I2510 Atherosclerotic heart disease of native coronary artery without angina pectoris: Secondary | ICD-10-CM | POA: Diagnosis not present

## 2022-06-05 DIAGNOSIS — C678 Malignant neoplasm of overlapping sites of bladder: Secondary | ICD-10-CM | POA: Diagnosis not present

## 2022-07-14 DIAGNOSIS — Z23 Encounter for immunization: Secondary | ICD-10-CM | POA: Diagnosis not present

## 2022-07-26 IMAGING — CT CT ABD-PEL WO/W CM
3 of 12 series · 10 of 46 positions shown, 16 images · IV contrast (Omnipaque or Isovue)
Comparison: CT 05/20/2007.  Radiographs 06/03/2008.

CLINICAL DATA: Hematuria with increasing urinary frequency for 3
weeks. History of kidney stones.

EXAM:
CT ABDOMEN AND PELVIS WITHOUT AND WITH CONTRAST
TECHNIQUE: Multidetector CT imaging of the abdomen and pelvis was performed
following the standard protocol before and following the bolus
administration of intravenous contrast.
CONTRAST:  125mL OMNIPAQUE IOHEXOL 300 MG/ML  SOLN

[Series 2: axial pre · axial · non-contrast · 0.87mm/px · z∈[+838,+1148]mm · 4 of 104 slices shown, 9 images]
[im 21/104  soft-tissue]
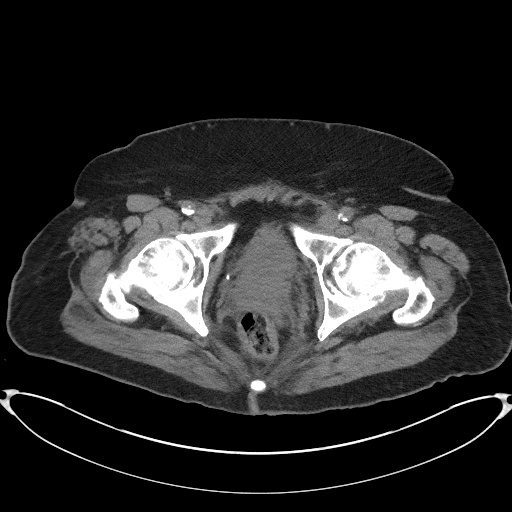
[im 21/104  lung]
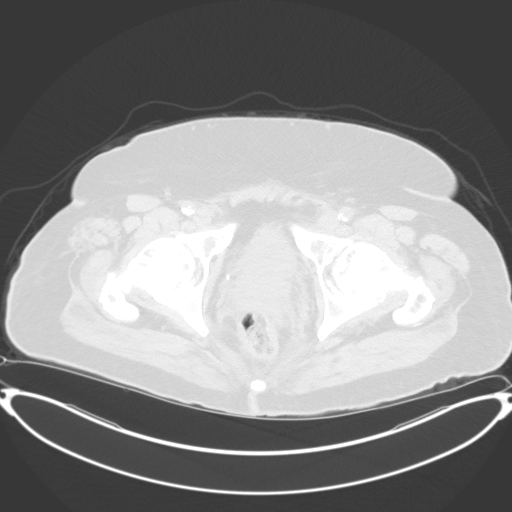
[im 21/104  bone]
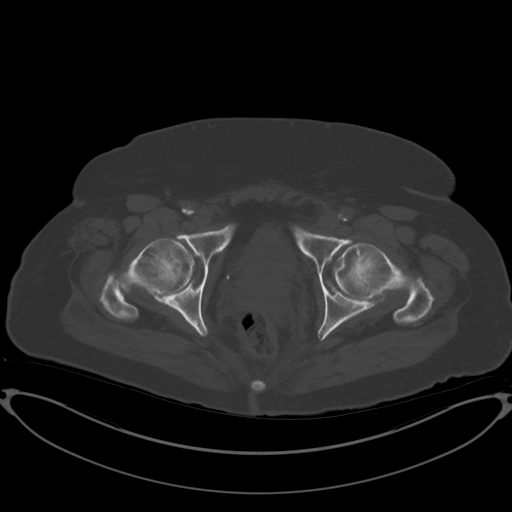
[im 42/104  soft-tissue]
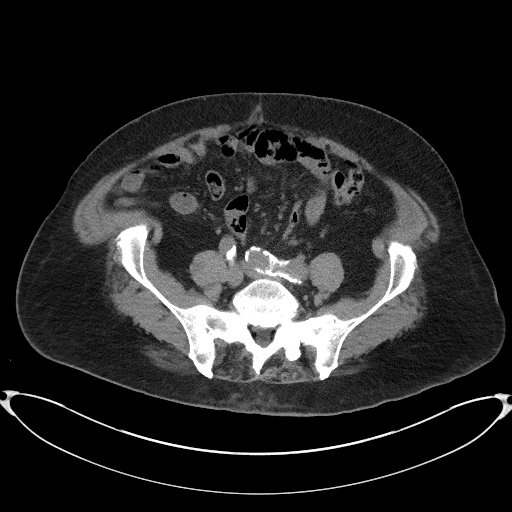
[im 42/104  lung]
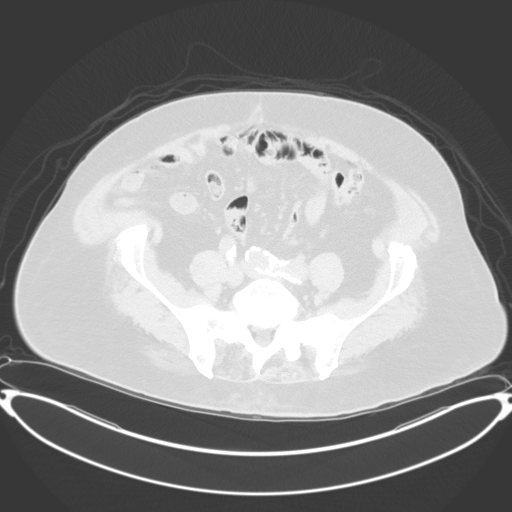
[im 62/104  soft-tissue]
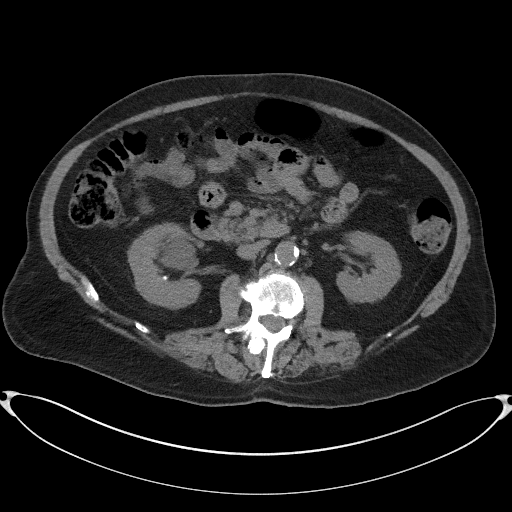
[im 62/104  lung]
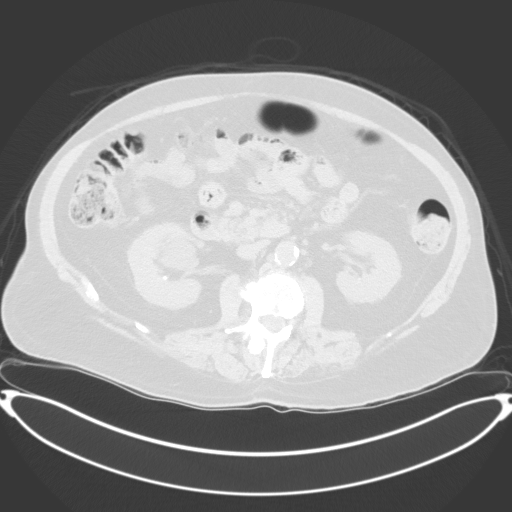
[im 83/104  soft-tissue]
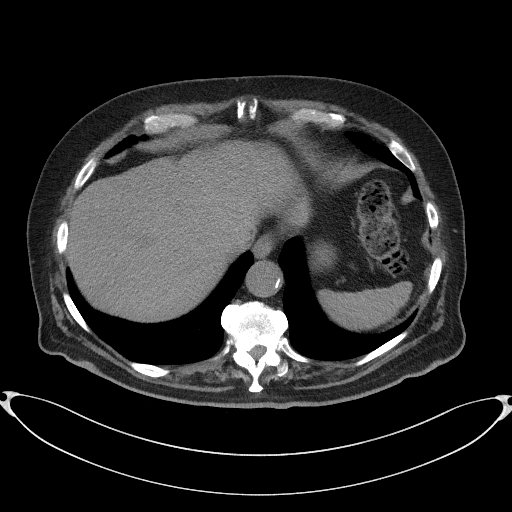
[im 83/104  lung]
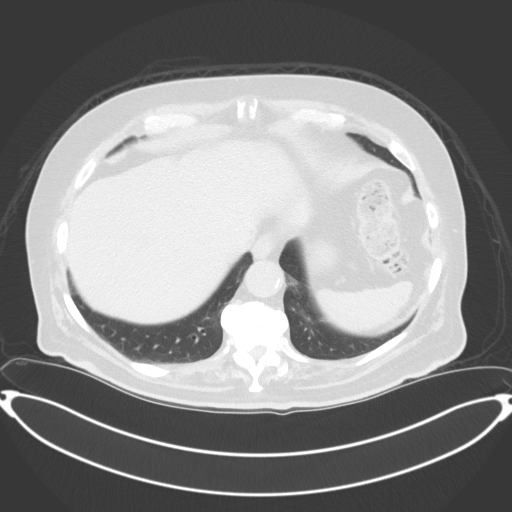

[Series 5: coronal pre · coronal · non-contrast · 0.91mm/px · 2 of 114 slices shown, 3 images]
[im 38/114  soft-tissue]
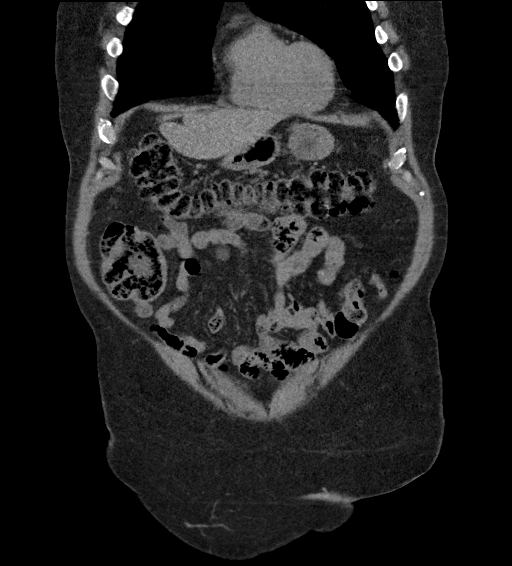
[im 38/114  bone]
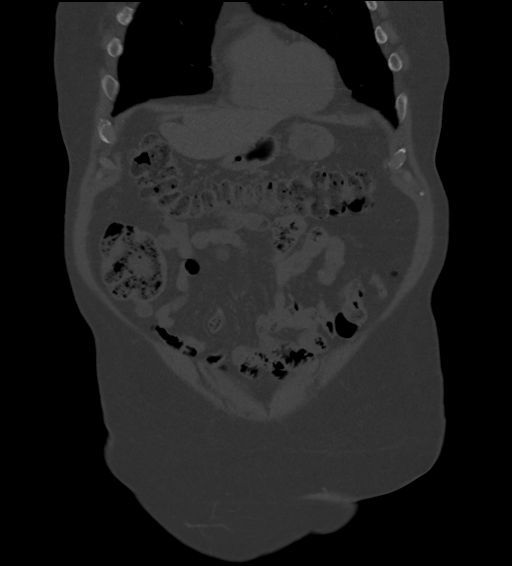
[im 76/114  soft-tissue]
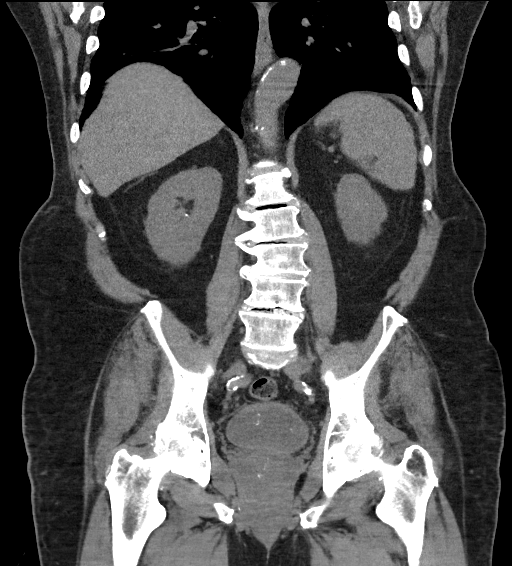

[Series 7: axial post · axial · 0.87mm/px · z∈[+838,+1148]mm · 4 of 104 slices shown]
[im 21/104  soft-tissue]
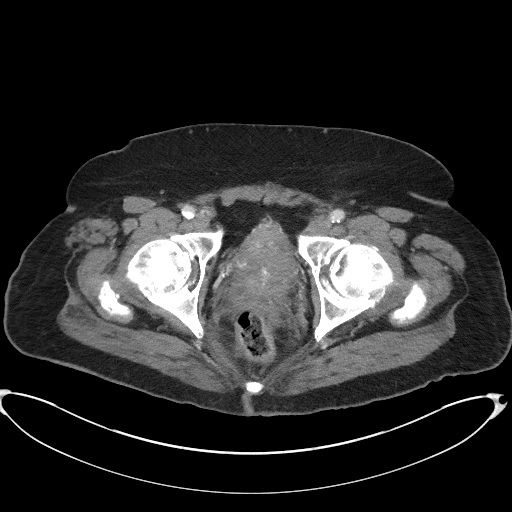
[im 42/104  soft-tissue]
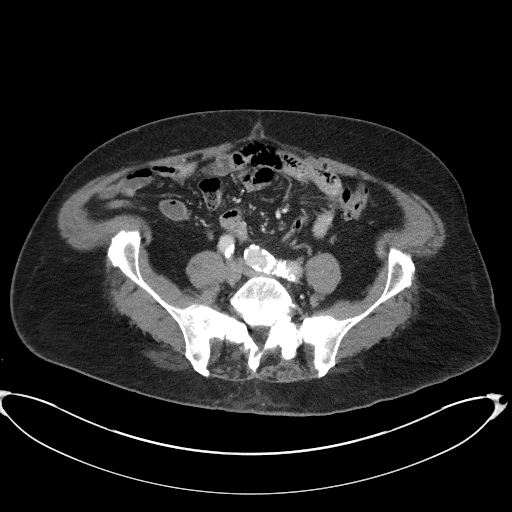
[im 62/104  soft-tissue]
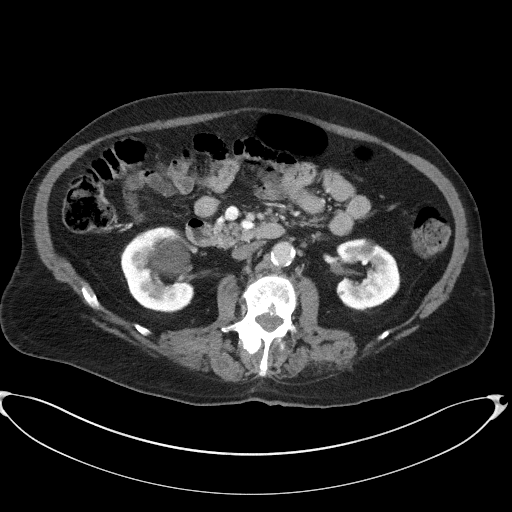
[im 83/104  soft-tissue]
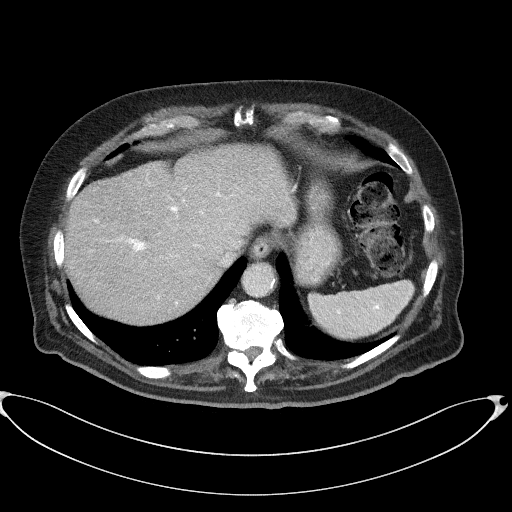

[10 of 46 positions shown; findings below may reference images not displayed]

FINDINGS: Lower chest: Mild scarring and traction bronchiectasis at both lung
bases. There is a stable calcified right lower lobe granuloma. There
is at least moderate atherosclerosis of the aorta and coronary
arteries, and probable aortic valvular calcifications are present.

Hepatobiliary: The liver is normal in density without suspicious
focal abnormality. No evidence of gallstones, gallbladder wall
thickening or biliary dilatation.

Pancreas: Unremarkable. No pancreatic ductal dilatation or
surrounding inflammatory changes.

Spleen: Normal in size without focal abnormality.

Adrenals/Urinary Tract: The adrenal glands appear stable without
significant findings. Pre contrast images demonstrate several
nonobstructing bilateral renal calculi. There is no evidence of
ureteral calculus or discrete bladder calculus. Post-contrast, both
kidneys enhance normally. There is no evidence of enhancing renal
mass. There are bilateral renal cysts, largest in the lower pole of
the right kidney, measuring up to 5.9 cm in diameter. Delayed images
result in segmental visualization of the ureters. No focal upper
tract urothelial abnormalities are identified. The bladder is
grossly abnormal with multiple irregular, partially calcified
enhancing bladder masses. Given the irregular shape of these masses,
they are difficult to measure. There is an inferior component
measuring 3.3 x 1.7 cm on image 82/7. The masses are nearly
circumferential, relatively sparing the posterior wall. No evidence
resulting ureteral obstruction.

Stomach/Bowel: The stomach appears unremarkable for its degree of
distension. No evidence of bowel wall thickening, distention or
surrounding inflammatory change. The appendix appears normal.

Vascular/Lymphatic: There are no enlarged abdominal or pelvic lymph
nodes. There is diffuse aortic and branch vessel atherosclerosis
with tortuosity of the distal aorta and iliac arteries. The distal
aorta is dilated to 3.7 cm on image 52/7. No evidence of large
vessel occlusion. No significant venous abnormalities.

Reproductive: Moderate enlargement of the prostate gland without
focal abnormality.

Other: No evidence of abdominal wall mass or hernia. No ascites.

Musculoskeletal: No acute or significant osseous findings.
Multilevel thoracolumbar spondylosis associated with a convex left
scoliosis. Moderate degenerative changes of both hips.
IMPRESSION: 1. Grossly abnormal appearance of the bladder with multiple
irregular, partially calcified enhancing bladder masses, most
consistent with multifocal transitional cell carcinoma. Cystoscopy
recommended.
2. No evidence of metastatic disease.
3. Nonobstructing bilateral renal calculi. No evidence of ureteral
calculus or obstruction. Bilateral renal cysts.
4. Aortic Atherosclerosis ([id number]). 3.7 cm abdominal aortic
aneurysm. Recommend follow-up ultrasound every 2 years. This
recommendation follows ACR consensus guidelines: White Paper of the
ACR Incidental Findings Committee II on Vascular Findings. [HOSPITAL] 0577; [DATE].
5. These results will be called to the ordering clinician or
representative by the Radiologist Assistant, and communication
documented in the PACS or [REDACTED].

## 2022-08-08 DIAGNOSIS — R809 Proteinuria, unspecified: Secondary | ICD-10-CM | POA: Diagnosis not present

## 2022-08-08 DIAGNOSIS — Z0000 Encounter for general adult medical examination without abnormal findings: Secondary | ICD-10-CM | POA: Diagnosis not present

## 2022-08-08 DIAGNOSIS — N202 Calculus of kidney with calculus of ureter: Secondary | ICD-10-CM | POA: Diagnosis not present

## 2022-08-08 DIAGNOSIS — G4733 Obstructive sleep apnea (adult) (pediatric): Secondary | ICD-10-CM | POA: Diagnosis not present

## 2022-08-08 DIAGNOSIS — C678 Malignant neoplasm of overlapping sites of bladder: Secondary | ICD-10-CM | POA: Diagnosis not present

## 2022-08-08 DIAGNOSIS — E118 Type 2 diabetes mellitus with unspecified complications: Secondary | ICD-10-CM | POA: Diagnosis not present

## 2022-08-08 DIAGNOSIS — E1169 Type 2 diabetes mellitus with other specified complication: Secondary | ICD-10-CM | POA: Diagnosis not present

## 2022-08-08 DIAGNOSIS — I2510 Atherosclerotic heart disease of native coronary artery without angina pectoris: Secondary | ICD-10-CM | POA: Diagnosis not present

## 2022-08-08 DIAGNOSIS — Z23 Encounter for immunization: Secondary | ICD-10-CM | POA: Diagnosis not present

## 2022-08-08 DIAGNOSIS — I10 Essential (primary) hypertension: Secondary | ICD-10-CM | POA: Diagnosis not present

## 2022-08-08 DIAGNOSIS — R32 Unspecified urinary incontinence: Secondary | ICD-10-CM | POA: Diagnosis not present

## 2022-08-08 DIAGNOSIS — E782 Mixed hyperlipidemia: Secondary | ICD-10-CM | POA: Diagnosis not present

## 2022-08-08 DIAGNOSIS — E669 Obesity, unspecified: Secondary | ICD-10-CM | POA: Diagnosis not present

## 2022-11-28 ENCOUNTER — Other Ambulatory Visit (HOSPITAL_COMMUNITY): Payer: Self-pay | Admitting: Urology

## 2022-11-28 ENCOUNTER — Ambulatory Visit (HOSPITAL_COMMUNITY)
Admission: RE | Admit: 2022-11-28 | Discharge: 2022-11-28 | Disposition: A | Discharge status: Home / Self Care | Payer: Medicare Other | Source: Ambulatory Visit | Attending: Urology | Admitting: Urology

## 2022-11-28 DIAGNOSIS — C768 Malignant neoplasm of other specified ill-defined sites: Secondary | ICD-10-CM | POA: Diagnosis not present

## 2022-11-28 DIAGNOSIS — J9811 Atelectasis: Secondary | ICD-10-CM | POA: Diagnosis not present

## 2022-12-20 DIAGNOSIS — C678 Malignant neoplasm of overlapping sites of bladder: Secondary | ICD-10-CM | POA: Diagnosis not present

## 2022-12-24 DIAGNOSIS — C678 Malignant neoplasm of overlapping sites of bladder: Secondary | ICD-10-CM | POA: Diagnosis not present

## 2022-12-24 DIAGNOSIS — N200 Calculus of kidney: Secondary | ICD-10-CM | POA: Diagnosis not present

## 2022-12-24 DIAGNOSIS — C679 Malignant neoplasm of bladder, unspecified: Secondary | ICD-10-CM | POA: Diagnosis not present

## 2022-12-27 DIAGNOSIS — N200 Calculus of kidney: Secondary | ICD-10-CM | POA: Diagnosis not present

## 2022-12-27 DIAGNOSIS — C678 Malignant neoplasm of overlapping sites of bladder: Secondary | ICD-10-CM | POA: Diagnosis not present

## 2022-12-27 DIAGNOSIS — D179 Benign lipomatous neoplasm, unspecified: Secondary | ICD-10-CM | POA: Diagnosis not present

## 2023-01-03 DIAGNOSIS — E782 Mixed hyperlipidemia: Secondary | ICD-10-CM | POA: Diagnosis not present

## 2023-01-03 DIAGNOSIS — E118 Type 2 diabetes mellitus with unspecified complications: Secondary | ICD-10-CM | POA: Diagnosis not present

## 2023-01-09 DIAGNOSIS — N202 Calculus of kidney with calculus of ureter: Secondary | ICD-10-CM | POA: Diagnosis not present

## 2023-01-09 DIAGNOSIS — E118 Type 2 diabetes mellitus with unspecified complications: Secondary | ICD-10-CM | POA: Diagnosis not present

## 2023-01-09 DIAGNOSIS — C678 Malignant neoplasm of overlapping sites of bladder: Secondary | ICD-10-CM | POA: Diagnosis not present

## 2023-01-09 DIAGNOSIS — G4733 Obstructive sleep apnea (adult) (pediatric): Secondary | ICD-10-CM | POA: Diagnosis not present

## 2023-01-09 DIAGNOSIS — R32 Unspecified urinary incontinence: Secondary | ICD-10-CM | POA: Diagnosis not present

## 2023-01-09 DIAGNOSIS — E1169 Type 2 diabetes mellitus with other specified complication: Secondary | ICD-10-CM | POA: Diagnosis not present

## 2023-01-09 DIAGNOSIS — E782 Mixed hyperlipidemia: Secondary | ICD-10-CM | POA: Diagnosis not present

## 2023-01-09 DIAGNOSIS — I10 Essential (primary) hypertension: Secondary | ICD-10-CM | POA: Diagnosis not present

## 2023-06-20 DIAGNOSIS — C678 Malignant neoplasm of overlapping sites of bladder: Secondary | ICD-10-CM | POA: Diagnosis not present

## 2023-06-21 ENCOUNTER — Other Ambulatory Visit (HOSPITAL_COMMUNITY): Payer: Self-pay | Admitting: Urology

## 2023-06-21 ENCOUNTER — Ambulatory Visit (HOSPITAL_COMMUNITY)
Admission: RE | Admit: 2023-06-21 | Discharge: 2023-06-21 | Disposition: A | Discharge status: Home / Self Care | Payer: Medicare Other | Source: Ambulatory Visit | Attending: Urology | Admitting: Urology

## 2023-06-21 DIAGNOSIS — C678 Malignant neoplasm of overlapping sites of bladder: Secondary | ICD-10-CM | POA: Insufficient documentation

## 2023-06-21 DIAGNOSIS — I771 Stricture of artery: Secondary | ICD-10-CM | POA: Diagnosis not present

## 2023-06-21 DIAGNOSIS — Z8551 Personal history of malignant neoplasm of bladder: Secondary | ICD-10-CM | POA: Diagnosis not present

## 2023-06-21 DIAGNOSIS — R531 Weakness: Secondary | ICD-10-CM | POA: Diagnosis not present

## 2023-06-24 DIAGNOSIS — N200 Calculus of kidney: Secondary | ICD-10-CM | POA: Diagnosis not present

## 2023-06-24 DIAGNOSIS — K627 Radiation proctitis: Secondary | ICD-10-CM | POA: Diagnosis not present

## 2023-06-24 DIAGNOSIS — C678 Malignant neoplasm of overlapping sites of bladder: Secondary | ICD-10-CM | POA: Diagnosis not present

## 2023-06-24 DIAGNOSIS — N281 Cyst of kidney, acquired: Secondary | ICD-10-CM | POA: Diagnosis not present

## 2023-06-27 DIAGNOSIS — C678 Malignant neoplasm of overlapping sites of bladder: Secondary | ICD-10-CM | POA: Diagnosis not present

## 2023-06-27 DIAGNOSIS — Z936 Other artificial openings of urinary tract status: Secondary | ICD-10-CM | POA: Diagnosis not present

## 2023-06-27 DIAGNOSIS — N200 Calculus of kidney: Secondary | ICD-10-CM | POA: Diagnosis not present

## 2023-07-03 DIAGNOSIS — Z936 Other artificial openings of urinary tract status: Secondary | ICD-10-CM | POA: Diagnosis not present

## 2023-07-08 DIAGNOSIS — E782 Mixed hyperlipidemia: Secondary | ICD-10-CM | POA: Diagnosis not present

## 2023-07-08 DIAGNOSIS — E118 Type 2 diabetes mellitus with unspecified complications: Secondary | ICD-10-CM | POA: Diagnosis not present

## 2023-07-12 DIAGNOSIS — E118 Type 2 diabetes mellitus with unspecified complications: Secondary | ICD-10-CM | POA: Diagnosis not present

## 2023-07-12 DIAGNOSIS — C678 Malignant neoplasm of overlapping sites of bladder: Secondary | ICD-10-CM | POA: Diagnosis not present

## 2023-07-12 DIAGNOSIS — N202 Calculus of kidney with calculus of ureter: Secondary | ICD-10-CM | POA: Diagnosis not present

## 2023-07-12 DIAGNOSIS — E1159 Type 2 diabetes mellitus with other circulatory complications: Secondary | ICD-10-CM | POA: Diagnosis not present

## 2023-07-12 DIAGNOSIS — R809 Proteinuria, unspecified: Secondary | ICD-10-CM | POA: Diagnosis not present

## 2023-07-12 DIAGNOSIS — I10 Essential (primary) hypertension: Secondary | ICD-10-CM | POA: Diagnosis not present

## 2023-07-12 DIAGNOSIS — E782 Mixed hyperlipidemia: Secondary | ICD-10-CM | POA: Diagnosis not present

## 2023-07-12 DIAGNOSIS — I2510 Atherosclerotic heart disease of native coronary artery without angina pectoris: Secondary | ICD-10-CM | POA: Diagnosis not present

## 2023-08-05 DIAGNOSIS — Z936 Other artificial openings of urinary tract status: Secondary | ICD-10-CM | POA: Diagnosis not present

## 2023-08-19 DIAGNOSIS — R4189 Other symptoms and signs involving cognitive functions and awareness: Secondary | ICD-10-CM | POA: Diagnosis not present

## 2023-08-19 DIAGNOSIS — R419 Unspecified symptoms and signs involving cognitive functions and awareness: Secondary | ICD-10-CM | POA: Diagnosis not present

## 2023-08-19 DIAGNOSIS — F952 Tourette's disorder: Secondary | ICD-10-CM | POA: Diagnosis not present

## 2023-09-17 DIAGNOSIS — Z23 Encounter for immunization: Secondary | ICD-10-CM | POA: Diagnosis not present

## 2023-09-17 DIAGNOSIS — Z0001 Encounter for general adult medical examination with abnormal findings: Secondary | ICD-10-CM | POA: Diagnosis not present

## 2023-09-17 DIAGNOSIS — F952 Tourette's disorder: Secondary | ICD-10-CM | POA: Diagnosis not present

## 2023-09-17 DIAGNOSIS — R419 Unspecified symptoms and signs involving cognitive functions and awareness: Secondary | ICD-10-CM | POA: Diagnosis not present

## 2023-09-17 DIAGNOSIS — I10 Essential (primary) hypertension: Secondary | ICD-10-CM | POA: Diagnosis not present

## 2023-09-17 DIAGNOSIS — R4189 Other symptoms and signs involving cognitive functions and awareness: Secondary | ICD-10-CM | POA: Diagnosis not present

## 2023-10-15 DIAGNOSIS — E782 Mixed hyperlipidemia: Secondary | ICD-10-CM | POA: Diagnosis not present

## 2023-10-15 DIAGNOSIS — Z936 Other artificial openings of urinary tract status: Secondary | ICD-10-CM | POA: Diagnosis not present

## 2023-10-15 DIAGNOSIS — E118 Type 2 diabetes mellitus with unspecified complications: Secondary | ICD-10-CM | POA: Diagnosis not present

## 2023-10-21 DIAGNOSIS — I10 Essential (primary) hypertension: Secondary | ICD-10-CM | POA: Diagnosis not present

## 2023-10-21 DIAGNOSIS — R419 Unspecified symptoms and signs involving cognitive functions and awareness: Secondary | ICD-10-CM | POA: Diagnosis not present

## 2023-10-21 DIAGNOSIS — E782 Mixed hyperlipidemia: Secondary | ICD-10-CM | POA: Diagnosis not present

## 2023-10-21 DIAGNOSIS — F952 Tourette's disorder: Secondary | ICD-10-CM | POA: Diagnosis not present

## 2023-10-21 DIAGNOSIS — E118 Type 2 diabetes mellitus with unspecified complications: Secondary | ICD-10-CM | POA: Diagnosis not present

## 2023-10-21 DIAGNOSIS — C678 Malignant neoplasm of overlapping sites of bladder: Secondary | ICD-10-CM | POA: Diagnosis not present

## 2023-12-10 DIAGNOSIS — Z936 Other artificial openings of urinary tract status: Secondary | ICD-10-CM | POA: Diagnosis not present

## 2024-02-13 DIAGNOSIS — E782 Mixed hyperlipidemia: Secondary | ICD-10-CM | POA: Diagnosis not present

## 2024-02-13 DIAGNOSIS — E118 Type 2 diabetes mellitus with unspecified complications: Secondary | ICD-10-CM | POA: Diagnosis not present

## 2024-02-20 DIAGNOSIS — R419 Unspecified symptoms and signs involving cognitive functions and awareness: Secondary | ICD-10-CM | POA: Diagnosis not present

## 2024-02-20 DIAGNOSIS — I10 Essential (primary) hypertension: Secondary | ICD-10-CM | POA: Diagnosis not present

## 2024-02-20 DIAGNOSIS — C679 Malignant neoplasm of bladder, unspecified: Secondary | ICD-10-CM | POA: Diagnosis not present

## 2024-02-20 DIAGNOSIS — C678 Malignant neoplasm of overlapping sites of bladder: Secondary | ICD-10-CM | POA: Diagnosis not present

## 2024-02-20 DIAGNOSIS — F952 Tourette's disorder: Secondary | ICD-10-CM | POA: Diagnosis not present

## 2024-02-26 DIAGNOSIS — Z936 Other artificial openings of urinary tract status: Secondary | ICD-10-CM | POA: Diagnosis not present

## 2024-04-14 DIAGNOSIS — Z961 Presence of intraocular lens: Secondary | ICD-10-CM | POA: Diagnosis not present

## 2024-04-14 DIAGNOSIS — H353131 Nonexudative age-related macular degeneration, bilateral, early dry stage: Secondary | ICD-10-CM | POA: Diagnosis not present

## 2024-06-12 DIAGNOSIS — E118 Type 2 diabetes mellitus with unspecified complications: Secondary | ICD-10-CM | POA: Diagnosis not present

## 2024-06-12 DIAGNOSIS — E782 Mixed hyperlipidemia: Secondary | ICD-10-CM | POA: Diagnosis not present

## 2024-06-18 DIAGNOSIS — I10 Essential (primary) hypertension: Secondary | ICD-10-CM | POA: Diagnosis not present

## 2024-06-18 DIAGNOSIS — E782 Mixed hyperlipidemia: Secondary | ICD-10-CM | POA: Diagnosis not present

## 2024-06-18 DIAGNOSIS — R419 Unspecified symptoms and signs involving cognitive functions and awareness: Secondary | ICD-10-CM | POA: Diagnosis not present

## 2024-06-18 DIAGNOSIS — Z0000 Encounter for general adult medical examination without abnormal findings: Secondary | ICD-10-CM | POA: Diagnosis not present

## 2024-06-18 DIAGNOSIS — F952 Tourette's disorder: Secondary | ICD-10-CM | POA: Diagnosis not present

## 2024-06-18 DIAGNOSIS — C678 Malignant neoplasm of overlapping sites of bladder: Secondary | ICD-10-CM | POA: Diagnosis not present

## 2024-08-20 DIAGNOSIS — Z936 Other artificial openings of urinary tract status: Secondary | ICD-10-CM | POA: Diagnosis not present

## 2024-09-30 DIAGNOSIS — C678 Malignant neoplasm of overlapping sites of bladder: Secondary | ICD-10-CM | POA: Diagnosis not present

## 2024-10-27 ENCOUNTER — Ambulatory Visit (HOSPITAL_COMMUNITY)
Admission: RE | Admit: 2024-10-27 | Discharge: 2024-10-27 | Disposition: A | Discharge status: Home / Self Care | Source: Ambulatory Visit | Attending: Urology | Admitting: Urology

## 2024-10-27 ENCOUNTER — Other Ambulatory Visit (HOSPITAL_COMMUNITY): Payer: Self-pay | Admitting: Urology

## 2024-10-27 DIAGNOSIS — C674 Malignant neoplasm of posterior wall of bladder: Secondary | ICD-10-CM | POA: Diagnosis present
